# Patient Record
Sex: Male | Born: 1935 | Race: White | Hispanic: No | Marital: Married
Health system: Southern US, Community
[De-identification: ages and names within clinical notes are randomized; demographics above are authoritative.]

## PROBLEM LIST (undated history)

## (undated) DIAGNOSIS — E785 Hyperlipidemia, unspecified: Secondary | ICD-10-CM

## (undated) DIAGNOSIS — I10 Essential (primary) hypertension: Secondary | ICD-10-CM

## (undated) DIAGNOSIS — I509 Heart failure, unspecified: Secondary | ICD-10-CM

## (undated) HISTORY — PX: OTHER SURGICAL HISTORY: SHX169

## (undated) HISTORY — PX: HERNIA REPAIR: SHX51

---

## 2000-05-04 ENCOUNTER — Inpatient Hospital Stay (HOSPITAL_COMMUNITY): Admission: EM | Admit: 2000-05-04 | Discharge: 2000-05-05 | Payer: Self-pay | Admitting: Emergency Medicine

## 2000-05-04 ENCOUNTER — Encounter: Payer: Self-pay | Admitting: Emergency Medicine

## 2014-07-09 DEATH — deceased

## 2019-02-06 DIAGNOSIS — R54 Age-related physical debility: Secondary | ICD-10-CM | POA: Insufficient documentation

## 2019-08-16 ENCOUNTER — Other Ambulatory Visit: Payer: Self-pay

## 2019-08-16 ENCOUNTER — Emergency Department (HOSPITAL_COMMUNITY): Payer: Medicare Other

## 2019-08-16 ENCOUNTER — Encounter (HOSPITAL_COMMUNITY): Payer: Self-pay | Admitting: *Deleted

## 2019-08-16 ENCOUNTER — Inpatient Hospital Stay (HOSPITAL_COMMUNITY)
Admission: EM | Admit: 2019-08-16 | Discharge: 2019-08-21 | DRG: 291 | Disposition: A | Discharge status: Skilled Nursing Facility | Payer: Medicare Other | Attending: Internal Medicine | Admitting: Internal Medicine

## 2019-08-16 ENCOUNTER — Emergency Department (HOSPITAL_COMMUNITY): Admission: EM | Admit: 2019-08-16 | Payer: Self-pay | Source: Home / Self Care

## 2019-08-16 DIAGNOSIS — Z87891 Personal history of nicotine dependence: Secondary | ICD-10-CM

## 2019-08-16 DIAGNOSIS — L97909 Non-pressure chronic ulcer of unspecified part of unspecified lower leg with unspecified severity: Secondary | ICD-10-CM | POA: Diagnosis present

## 2019-08-16 DIAGNOSIS — R0902 Hypoxemia: Secondary | ICD-10-CM

## 2019-08-16 DIAGNOSIS — I5031 Acute diastolic (congestive) heart failure: Secondary | ICD-10-CM | POA: Diagnosis present

## 2019-08-16 DIAGNOSIS — S7002XA Contusion of left hip, initial encounter: Secondary | ICD-10-CM | POA: Diagnosis present

## 2019-08-16 DIAGNOSIS — R0602 Shortness of breath: Secondary | ICD-10-CM

## 2019-08-16 DIAGNOSIS — I350 Nonrheumatic aortic (valve) stenosis: Secondary | ICD-10-CM | POA: Diagnosis not present

## 2019-08-16 DIAGNOSIS — J9601 Acute respiratory failure with hypoxia: Secondary | ICD-10-CM | POA: Diagnosis present

## 2019-08-16 DIAGNOSIS — Z79899 Other long term (current) drug therapy: Secondary | ICD-10-CM

## 2019-08-16 DIAGNOSIS — Z9181 History of falling: Secondary | ICD-10-CM

## 2019-08-16 DIAGNOSIS — T148XXA Other injury of unspecified body region, initial encounter: Secondary | ICD-10-CM

## 2019-08-16 DIAGNOSIS — D72829 Elevated white blood cell count, unspecified: Secondary | ICD-10-CM | POA: Diagnosis present

## 2019-08-16 DIAGNOSIS — I082 Rheumatic disorders of both aortic and tricuspid valves: Secondary | ICD-10-CM | POA: Diagnosis present

## 2019-08-16 DIAGNOSIS — J90 Pleural effusion, not elsewhere classified: Secondary | ICD-10-CM

## 2019-08-16 DIAGNOSIS — W010XXA Fall on same level from slipping, tripping and stumbling without subsequent striking against object, initial encounter: Secondary | ICD-10-CM | POA: Diagnosis present

## 2019-08-16 DIAGNOSIS — I493 Ventricular premature depolarization: Secondary | ICD-10-CM | POA: Diagnosis present

## 2019-08-16 DIAGNOSIS — I83009 Varicose veins of unspecified lower extremity with ulcer of unspecified site: Secondary | ICD-10-CM | POA: Diagnosis present

## 2019-08-16 DIAGNOSIS — I110 Hypertensive heart disease with heart failure: Secondary | ICD-10-CM | POA: Diagnosis not present

## 2019-08-16 DIAGNOSIS — R7989 Other specified abnormal findings of blood chemistry: Secondary | ICD-10-CM

## 2019-08-16 DIAGNOSIS — E785 Hyperlipidemia, unspecified: Secondary | ICD-10-CM | POA: Diagnosis present

## 2019-08-16 DIAGNOSIS — E876 Hypokalemia: Secondary | ICD-10-CM | POA: Diagnosis present

## 2019-08-16 DIAGNOSIS — Z20822 Contact with and (suspected) exposure to covid-19: Secondary | ICD-10-CM | POA: Diagnosis present

## 2019-08-16 DIAGNOSIS — R609 Edema, unspecified: Secondary | ICD-10-CM

## 2019-08-16 DIAGNOSIS — Z7982 Long term (current) use of aspirin: Secondary | ICD-10-CM | POA: Diagnosis not present

## 2019-08-16 DIAGNOSIS — I480 Paroxysmal atrial fibrillation: Secondary | ICD-10-CM | POA: Diagnosis present

## 2019-08-16 DIAGNOSIS — I361 Nonrheumatic tricuspid (valve) insufficiency: Secondary | ICD-10-CM | POA: Diagnosis not present

## 2019-08-16 DIAGNOSIS — I4891 Unspecified atrial fibrillation: Secondary | ICD-10-CM

## 2019-08-16 DIAGNOSIS — Z85828 Personal history of other malignant neoplasm of skin: Secondary | ICD-10-CM | POA: Diagnosis not present

## 2019-08-16 DIAGNOSIS — W19XXXA Unspecified fall, initial encounter: Secondary | ICD-10-CM

## 2019-08-16 HISTORY — DX: Essential (primary) hypertension: I10

## 2019-08-16 HISTORY — DX: Hyperlipidemia, unspecified: E78.5

## 2019-08-16 LAB — CBC WITH DIFFERENTIAL/PLATELET
Abs Immature Granulocytes: 0.07 10*3/uL (ref 0.00–0.07)
Basophils Absolute: 0.1 10*3/uL (ref 0.0–0.1)
Basophils Relative: 0 %
Eosinophils Absolute: 0 10*3/uL (ref 0.0–0.5)
Eosinophils Relative: 0 %
HCT: 44.1 % (ref 39.0–52.0)
Hemoglobin: 14 g/dL (ref 13.0–17.0)
Immature Granulocytes: 1 %
Lymphocytes Relative: 6 %
Lymphs Abs: 0.7 10*3/uL (ref 0.7–4.0)
MCH: 32.8 pg (ref 26.0–34.0)
MCHC: 31.7 g/dL (ref 30.0–36.0)
MCV: 103.3 fL — ABNORMAL HIGH (ref 80.0–100.0)
Monocytes Absolute: 1.1 10*3/uL — ABNORMAL HIGH (ref 0.1–1.0)
Monocytes Relative: 9 %
Neutro Abs: 9.6 10*3/uL — ABNORMAL HIGH (ref 1.7–7.7)
Neutrophils Relative %: 84 %
Platelets: 209 10*3/uL (ref 150–400)
RBC: 4.27 MIL/uL (ref 4.22–5.81)
RDW: 14.8 % (ref 11.5–15.5)
WBC: 11.5 10*3/uL — ABNORMAL HIGH (ref 4.0–10.5)
nRBC: 0 % (ref 0.0–0.2)

## 2019-08-16 LAB — COMPREHENSIVE METABOLIC PANEL
ALT: 24 U/L (ref 0–44)
AST: 30 U/L (ref 15–41)
Albumin: 3.5 g/dL (ref 3.5–5.0)
Alkaline Phosphatase: 78 U/L (ref 38–126)
Anion gap: 9 (ref 5–15)
BUN: 32 mg/dL — ABNORMAL HIGH (ref 8–23)
CO2: 27 mmol/L (ref 22–32)
Calcium: 8.8 mg/dL — ABNORMAL LOW (ref 8.9–10.3)
Chloride: 105 mmol/L (ref 98–111)
Creatinine, Ser: 1.42 mg/dL — ABNORMAL HIGH (ref 0.61–1.24)
GFR calc Af Amer: 50 mL/min — ABNORMAL LOW (ref >60)
GFR calc non Af Amer: 43 mL/min — ABNORMAL LOW (ref >60)
Glucose, Bld: 89 mg/dL (ref 70–99)
Potassium: 3.9 mmol/L (ref 3.5–5.1)
Sodium: 141 mmol/L (ref 135–145)
Total Bilirubin: 1.9 mg/dL — ABNORMAL HIGH (ref 0.3–1.2)
Total Protein: 6.3 g/dL — ABNORMAL LOW (ref 6.5–8.1)

## 2019-08-16 LAB — URINALYSIS, ROUTINE W REFLEX MICROSCOPIC
Bilirubin Urine: NEGATIVE
Glucose, UA: NEGATIVE mg/dL
Hgb urine dipstick: NEGATIVE
Ketones, ur: NEGATIVE mg/dL
Leukocytes,Ua: NEGATIVE
Nitrite: NEGATIVE
Protein, ur: NEGATIVE mg/dL
Specific Gravity, Urine: 1.009 (ref 1.005–1.030)
pH: 6 (ref 5.0–8.0)

## 2019-08-16 LAB — MAGNESIUM: Magnesium: 2 mg/dL (ref 1.7–2.4)

## 2019-08-16 LAB — TROPONIN I (HIGH SENSITIVITY)
Troponin I (High Sensitivity): 53 ng/L — ABNORMAL HIGH (ref <18)
Troponin I (High Sensitivity): 47 ng/L — ABNORMAL HIGH (ref <18)

## 2019-08-16 LAB — BRAIN NATRIURETIC PEPTIDE: B Natriuretic Peptide: 1896.9 pg/mL — ABNORMAL HIGH (ref 0.0–100.0)

## 2019-08-16 LAB — PROTIME-INR
INR: 1.3 — ABNORMAL HIGH (ref 0.8–1.2)
Prothrombin Time: 15.6 seconds — ABNORMAL HIGH (ref 11.4–15.2)

## 2019-08-16 MED ORDER — FUROSEMIDE 10 MG/ML IJ SOLN
40.0000 mg | Freq: Four times a day (QID) | INTRAMUSCULAR | Status: DC
  Administered 2019-08-16 – 2019-08-19 (×9): 40 mg via INTRAVENOUS
  Filled 2019-08-16 (×10): qty 4

## 2019-08-16 MED ORDER — FUROSEMIDE 10 MG/ML IJ SOLN
20.0000 mg | Freq: Once | INTRAMUSCULAR | Status: AC
  Administered 2019-08-16: 20 mg via INTRAVENOUS
  Filled 2019-08-16: qty 4

## 2019-08-16 MED ORDER — SODIUM CHLORIDE 0.9% FLUSH
3.0000 mL | Freq: Two times a day (BID) | INTRAVENOUS | Status: DC
  Administered 2019-08-17 – 2019-08-20 (×9): 3 mL via INTRAVENOUS

## 2019-08-16 NOTE — H&P (Addendum)
History and Physical    Michael Haas LGX:[id number] DOB: 22-Apr-1929 DOA: 08/16/2019  PCP: Christain Sacramento, MD   Chief Complaint: Left hip pain  HPI: Michael Haas is a 86 y.o. male with medical history significant of hypertension, hyperlipidemia, chronic venous stasis ulcers and chronic left wound currently being evaluated in the outpatient setting with wound care, basal cell carcinoma, with ambulatory dysfunction  who presents with left hip pain after mechanical fall while in the bathroom.  Initially patient was unable to bear weight and felt that the pain would improve but over the past 12 hours the pain has not subsided and patient remains unable to ambulate at his usual baseline.  He denies any loss of consciousness syncope presyncope vertiginous symptoms but does indicate this was a mechanical fall, he did not strike his head or injure any other limbs but notes that he slipped and fell onto his left hip.  Review of systems otherwise remarkable for bilateral lower extremity edema, questionably worse than baseline as well as orthopnea, dyspnea with exertion but declines fevers, chills, nausea, vomiting, diarrhea, syncope, presyncope chest pain, sick contacts or recent travel.  ED Course: In the ED patient's lab work revealed a markedly elevated BNP over 1500, plain film of the left hip showed no overt findings, CT abdomen pelvis was also done showing what appears to be a hematoma.  Patient's telemetry and EKG are also concerning for A. fib which may be a new diagnosis for the patient.  While being interviewed in the emergency department patient required oxygen supplementation as his respiratory rate increased and his oxygen levels decreased ultimately requiring upwards of 2 L nasal cannula to maintain sats above 90%.  In the ED patient received Lasix 20 IV x1.  Review of Systems: As per HPI above  Assessment/Plan Principal Problem:   Acute respiratory failure with hypoxia (HCC) Active Problems:  Unspecified atrial fibrillation (HCC)   Venous stasis ulcer (HCC)    Acute hypoxic respiratory failure in the setting of volume overload, POA Rule out new onset heart failure Acute hypoxia with reported orthopnea and dyspnea with exertion with bilateral lower extremity edema consistent with elevated BNP Patient carries no known diagnosis of heart failure however given clinical appearance this is the most likely etiology Follow echocardiogram Continue aggressive diuresis given volume status and hypoxia, Lasix 40 every 6 hours IV, follow creatinine, adjust accordingly Hold ACE inhibitor and HCTZ while diuresing Continue supportive care, nebs, oxygen, early ambulation and proning if necessary to improve patient's respiratory status Patient currently on 2 L nasal cannula in the ED department satting low 90s/high 80s but appears well without accessory muscle use  Irregular heart rate, rule out new onset A. fib versus paroxysmal atrial fibrillation, currently rate controlled Continue to follow along on telemetry, currently rate controlled CHA2DS2-VASc of 4, will need to discuss anticoagulation prior to discharge, however if patient continues to be at high risk for falls with recurrent procedure on left lower extremity given chronic ulcer infection risks of bleeding may outweigh benefit of chronic anticoagulation. Patient declines any irregular heart rate history however May 2020 patient had cardiology follow-up with Dr. Redmond Pulling for "irregular heartbeat" with no definitive diagnosis at that time  Chronic left lower extremity venous stasis ulcer and left lower extremity wound, chronic, POA Currently being followed in the outpatient wound care center -most recent evaluation 07/17/2019 with good report and no new antibiotics or treatment modalities  Acute on chronic ambulatory dysfunction in the setting of above Follow  along with PT OT, patient may benefit from home health versus SNF placement pending  recommendations  AKI versus CKD, POA  Follow morning labs, no creatinine of note available per chart review -we will attempt to obtain records from outpatient Lab Results  Component Value Date   CREATININE 1.42 (H) 08/16/2019   Leukocytosis, mild, likely reactive Follow with morning labs, likely reactive leukocytosis given above Certainly patient does have chronic lower extremity wound but remains afebrile and states the wound looks "better than it has in the past"  Hypertension Home medications include lisinopril, hydrochlorothiazide, metoprolol -we will resume once verified and as indicated  Hyperlipidemia Home medication includes atorvastatin 40, fish oil/omega-3/DHA - resume once verified  Basal cell carcinoma history Appears to be distant diagnosis, no acute issues  Morbid obesity There is no height or weight on file to calculate BMI.   DVT prophylaxis: Early ambulation, holding anticoagulation given hematoma noted in left leg on imaging as above Code Status: Full, lengthy discussion about CPR and intubation at his age, will follow up with PCP and wife for group discussion about advanced directives Family Communication: Wife unavailable by phone Disposition Plan: Inpatient, tentative disposition likely back home pending PT OT evaluation patient may benefit from home health versus SNF placement again pending evaluation.   Consults called: None Admission status: Inpatient, telemetry, continues to require multiple day stay given acute hypoxic respiratory failure in setting of volume overload concern for new onset heart failure as well as new onset A. fib with ambulatory dysfunction.  Patient requires IV diuresis, close monitoring in the setting of aggressive diuresis, further evaluation to diagnose etiology of sudden onset hypervolemia and acute hypoxic respiratory failure.   Past Medical History:  Diagnosis Date  . Hyperlipidemia   . Hypertension     Past Surgical History:    Procedure Laterality Date  . HERNIA REPAIR    . left hand surgery     "It was caught in a machine years ago"     reports that he has never smoked. He quit smokeless tobacco use about 8 weeks ago.  His smokeless tobacco use included chew. He reports previous alcohol use. He reports that he does not use drugs.  No Known Allergies  History reviewed. No pertinent family history.  Prior to Admission medications   Medication Sig Start Date End Date Taking? Authorizing Provider  aspirin 81 MG chewable tablet Chew 81 mg by mouth daily.   Yes [provider]  atorvastatin (LIPITOR) 40 MG tablet Take 40 mg by mouth daily. 02/06/19  Yes [provider]  lisinopril-hydrochlorothiazide (ZESTORETIC) 20-12.5 MG tablet Take 1 tablet by mouth daily. 07/13/19  Yes [provider]  metoprolol tartrate (LOPRESSOR) 100 MG tablet Take 100 mg by mouth 2 (two) times daily. 07/17/19  Yes [provider]    Physical Exam: Vitals:   08/16/19 1037 08/16/19 1230 08/16/19 1300 08/16/19 1523  BP: (!) 128/94 (!) 135/98 (!) 137/96 140/87  Pulse: 99 91 (!) 33 87  Resp: (!) 25 16 18  (!) 35  Temp: 98.2 F (36.8 C)     TempSrc: Oral     SpO2: 100% 92% 91% 94%    Constitutional: NAD, calm, comfortable Vitals:   08/16/19 1037 08/16/19 1230 08/16/19 1300 08/16/19 1523  BP: (!) 128/94 (!) 135/98 (!) 137/96 140/87  Pulse: 99 91 (!) 33 87  Resp: (!) 25 16 18  (!) 35  Temp: 98.2 F (36.8 C)     TempSrc: Oral  SpO2: 100% 92% 91% 94%   General:  Pleasantly resting in bed, No acute distress.  Nontoxic in appearance. HEENT:  Normocephalic atraumatic.  Sclerae nonicteric, noninjected.  Extraocular movements intact bilaterally. Neck:  Without mass or deformity.  Trachea is midline. Lungs: Bibasilar rales without overt rhonchi or wheeze.  Tachypneic into the 20s without respiratory distress, no accessory muscle use Heart: Irregularly irregular.  Without murmurs, rubs, or  gallops. Abdomen:  Soft, nontender, nondistended.  Without guarding or rebound. Extremities: 2+ pitting edema bilateral lower extremities to the knees, left greater than right; left lower extremity bandage and compression sock clean dry intact Vascular: Without obvious JVD, radial pulses bilaterally intact, irregular Psych: Appropriate mood and insight, oriented x4  Labs on Admission: I have personally reviewed following labs and imaging studies  CBC: Recent Labs  Lab 08/16/19 1054  WBC 11.5*  NEUTROABS 9.6*  HGB 14.0  HCT 44.1  MCV 103.3*  PLT 209   Basic Metabolic Panel: Recent Labs  Lab 08/16/19 1054  NA 141  K 3.9  CL 105  CO2 27  GLUCOSE 89  BUN 32*  CREATININE 1.42*  CALCIUM 8.8*  MG 2.0   GFR: CrCl cannot be calculated (Unknown ideal weight.). Liver Function Tests: Recent Labs  Lab 08/16/19 1054  AST 30  ALT 24  ALKPHOS 78  BILITOT 1.9*  PROT 6.3*  ALBUMIN 3.5   No results for input(s): LIPASE, AMYLASE in the last 168 hours. No results for input(s): AMMONIA in the last 168 hours. Coagulation Profile: Recent Labs  Lab 08/16/19 1054  INR 1.3*   Cardiac Enzymes: No results for input(s): CKTOTAL, CKMB, CKMBINDEX, TROPONINI in the last 168 hours. BNP (last 3 results) No results for input(s): PROBNP in the last 8760 hours. HbA1C: No results for input(s): HGBA1C in the last 72 hours. CBG: No results for input(s): GLUCAP in the last 168 hours. Lipid Profile: No results for input(s): CHOL, HDL, LDLCALC, TRIG, CHOLHDL, LDLDIRECT in the last 72 hours. Thyroid Function Tests: No results for input(s): TSH, T4TOTAL, FREET4, T3FREE, THYROIDAB in the last 72 hours. Anemia Panel: No results for input(s): VITAMINB12, FOLATE, FERRITIN, TIBC, IRON, RETICCTPCT in the last 72 hours. Urine analysis: No results found for: COLORURINE, APPEARANCEUR, LABSPEC, PHURINE, GLUCOSEU, HGBUR, BILIRUBINUR, KETONESUR, PROTEINUR, UROBILINOGEN, NITRITE,  LEUKOCYTESUR  Radiological Exams on Admission: DG Chest 2 View  Result Date: 08/16/2019 CLINICAL DATA:  Un witnessed fall. EXAM: CHEST - 2 VIEW COMPARISON:  None. FINDINGS: Moderate right and small left pleural effusions. There is additional lung base opacity, also greater on the right, consistent with atelectasis. Remainder of the lungs is clear. No pneumothorax. Cardiac silhouette is mildly enlarged. No mediastinal or hilar masses. Skeletal structures are grossly intact. IMPRESSION: 1. Moderate right and small left pleural effusions with right greater than left lung base atelectasis. No convincing pneumonia and no evidence of pulmonary edema. 2. Mild cardiomegaly. Electronically Signed   By: Amie Portland M.D.   On: 08/16/2019 11:51   CT PELVIS WO CONTRAST  Result Date: 08/16/2019 CLINICAL DATA:  86 year old with left hip pain after fall. Cannot bear weight. Unwitnessed fall during the night when walking to the bathroom. EXAM: CT PELVIS WITHOUT CONTRAST TECHNIQUE: Multidetector CT imaging of the pelvis was performed following the standard protocol without intravenous contrast. COMPARISON:  Radiograph earlier this day. FINDINGS: Urinary Tract: Distal ureters are decompressed. Mildly distended urinary bladder with wall thickening at the base. Bowel:  Pelvic bowel loops are unremarkable. Vascular/Lymphatic: Atherosclerosis of the distal aorta and  iliac vessels. No bulky abdominopelvic adenopathy. Reproductive:  Enlarged prostate gland spanning 5.4 cm transverse. Other: Generalized body wall and intra-abdominal edema. No significant pelvic free fluid. Musculoskeletal: No evidence of acute fracture on CT. Femoral head is seated in the acetabulum. Pubic rami are intact. Mild degenerative change of the pubic symphysis and sacroiliac joints. Heterogeneous enlargement of the left gluteus maximus muscle. Generalized body wall edema, confluent in the flanks but no focal subcutaneous soft tissue hematoma. IMPRESSION:  1. No evidence of acute fracture on CT. If there is high clinical suspicion for occult hip fracture or the patient refuses to weightbear, consider further evaluation with MRI. 2. Heterogeneous enlargement of the left gluteus maximus muscle suspicious for intramuscular hematoma in the setting of trauma. 3. Generalized body wall edema. 4. Enlarged prostate gland causing mass effect on the bladder base. Aortic Atherosclerosis (ICD10-I70.0). Electronically Signed   By: Narda Rutherford M.D.   On: 08/16/2019 14:11   DG Hip Unilat W or Wo Pelvis 2-3 Views Left  Result Date: 08/16/2019 CLINICAL DATA:  Un witnessed fall.  Left hip pain. EXAM: DG HIP (WITH OR WITHOUT PELVIS) 2-3V LEFT COMPARISON:  None. FINDINGS: No fracture or bone lesion. Hip joint is normally spaced and aligned.  No arthropathic changes. Soft tissues are unremarkable. IMPRESSION: Negative. Electronically Signed   By: Amie Portland M.D.   On: 08/16/2019 11:51    EKG: Independently reviewed.  Irregular no notable P waves, consistent with atrial fibrillation, notable right bundle branch morphology laterally without overt ST elevation or depression, T wave inversion diffusely with occasional PVC  Azucena Fallen DO [facility]  If 7PM-7AM, please contact night-coverage [email or web address]   08/16/2019, 5:47 PM

## 2019-08-16 NOTE — ED Notes (Signed)
Per request from Dr. Rush Landmark, patient now on 2L [place]. Patient also had brief changed by Rayfield Citizen, RN and Lindie Spruce, RN. Patient now clean and dry.

## 2019-08-16 NOTE — ED Notes (Signed)
Provider at bedside

## 2019-08-16 NOTE — ED Notes (Signed)
Pt provided with dinner tray.

## 2019-08-16 NOTE — ED Provider Notes (Signed)
[facility] DEPT Provider Note   CSN: [id number] Arrival date & time: 08/16/19  1019     History Chief Complaint  Patient presents with  . Fall    Michael Haas is a 86 y.o. male.  The history is provided by the patient and medical records. No language interpreter was used.  Hip Pain This is a new problem. The current episode started 12 to 24 hours ago. The problem occurs rarely. The problem has not changed since onset.Pertinent negatives include no chest pain, no abdominal pain, no headaches and no shortness of breath. The symptoms are aggravated by standing and walking. Nothing relieves the symptoms. He has tried nothing for the symptoms. The treatment provided no relief.       No past medical history on file.  There are no problems to display for this patient.   Past Surgical History:  Procedure Laterality Date  . HERNIA REPAIR    . left hand surgery     "It was caught in a machine years ago"       No family history on file.  Social History   Tobacco Use  . Smoking status: Not on file  Substance Use Topics  . Alcohol use: Not on file  . Drug use: Not on file    Home Medications Prior to Admission medications   Not on File    Allergies    Patient has no allergy information on record.  Review of Systems   Review of Systems  Constitutional: Negative for chills, diaphoresis, fatigue and fever.  HENT: Negative for congestion.   Eyes: Negative for visual disturbance.  Respiratory: Negative for cough, chest tightness, shortness of breath and wheezing.   Cardiovascular: Positive for leg swelling. Negative for chest pain and palpitations.  Gastrointestinal: Negative for abdominal pain, constipation, diarrhea, nausea and vomiting.  Genitourinary: Negative for dysuria, flank pain and frequency.  Musculoskeletal: Negative for back pain, neck pain and neck stiffness.  Skin: Negative for rash and wound.  Neurological: Negative for  dizziness, weakness, light-headedness, numbness and headaches.  Psychiatric/Behavioral: Negative for agitation and confusion.  All other systems reviewed and are negative.   Physical Exam Updated Vital Signs BP 140/87 (BP Location: Left Arm)   Pulse 87   Temp 98.2 F (36.8 C) (Oral)   Resp (!) 35   SpO2 94%   Physical Exam Vitals and nursing note reviewed.  Constitutional:      Appearance: He is well-developed. He is not ill-appearing, toxic-appearing or diaphoretic.  HENT:     Head: Normocephalic and atraumatic.     Nose: Nose normal. No congestion or rhinorrhea.     Mouth/Throat:     Mouth: Mucous membranes are moist.     Pharynx: No oropharyngeal exudate or posterior oropharyngeal erythema.  Eyes:     Extraocular Movements: Extraocular movements intact.     Conjunctiva/sclera: Conjunctivae normal.     Pupils: Pupils are equal, round, and reactive to light.  Cardiovascular:     Rate and Rhythm: Normal rate. Rhythm irregular.     Pulses: Normal pulses.     Heart sounds: No murmur.  Pulmonary:     Effort: Pulmonary effort is normal. No respiratory distress.     Breath sounds: Normal breath sounds. No wheezing, rhonchi or rales.  Chest:     Chest wall: No tenderness.  Abdominal:     General: Abdomen is flat.     Palpations: Abdomen is soft.     Tenderness: There is  no abdominal tenderness. There is no right CVA tenderness, left CVA tenderness, guarding or rebound.  Musculoskeletal:        General: Tenderness present.     Cervical back: Neck supple. No tenderness.     Right lower leg: Edema present.     Left lower leg: Edema present.  Skin:    General: Skin is warm and dry.     Capillary Refill: Capillary refill takes less than 2 seconds.  Neurological:     General: No focal deficit present.     Mental Status: He is alert.     Cranial Nerves: No cranial nerve deficit.     Sensory: No sensory deficit.     Motor: No weakness.  Psychiatric:        Mood and Affect:  Mood normal.     ED Results / Procedures / Treatments   Labs (all labs ordered are listed, but only abnormal results are displayed) Labs Reviewed  CBC WITH DIFFERENTIAL/PLATELET - Abnormal; Notable for the following components:      Result Value   WBC 11.5 (*)    MCV 103.3 (*)    Neutro Abs 9.6 (*)    Monocytes Absolute 1.1 (*)    All other components within normal limits  COMPREHENSIVE METABOLIC PANEL - Abnormal; Notable for the following components:   BUN 32 (*)    Creatinine, Ser 1.42 (*)    Calcium 8.8 (*)    Total Protein 6.3 (*)    Total Bilirubin 1.9 (*)    GFR calc non Af Amer 43 (*)    GFR calc Af Amer 50 (*)    All other components within normal limits  BRAIN NATRIURETIC PEPTIDE - Abnormal; Notable for the following components:   B Natriuretic Peptide 1,896.9 (*)    All other components within normal limits  PROTIME-INR - Abnormal; Notable for the following components:   Prothrombin Time 15.6 (*)    INR 1.3 (*)    All other components within normal limits  TROPONIN I (HIGH SENSITIVITY) - Abnormal; Notable for the following components:   Troponin I (High Sensitivity) 53 (*)    All other components within normal limits  TROPONIN I (HIGH SENSITIVITY) - Abnormal; Notable for the following components:   Troponin I (High Sensitivity) 47 (*)    All other components within normal limits  URINE CULTURE  SARS CORONAVIRUS 2 (TAT 6-24 HRS)  MAGNESIUM  URINALYSIS, ROUTINE W REFLEX MICROSCOPIC  CBC  COMPREHENSIVE METABOLIC PANEL    EKG EKG Interpretation  Date/Time:  Thursday August 16 2019 11:04:01 EDT Ventricular Rate:  93 PR Interval:    QRS Duration: 152 QT Interval:  394 QTC Calculation: 491 R Axis:   -117 Text Interpretation: Atrial fibrillation Multiple ventricular premature complexes RBBB and LAFB Borderline ST depression, lateral leads When comapred to prior ECG from 20 years ago, similar Afib. No STEMI Confirmed by Theda Belfast ([id number]) on 08/16/2019  11:15:32 AM   Radiology DG Chest 2 View  Result Date: 08/16/2019 CLINICAL DATA:  Un witnessed fall. EXAM: CHEST - 2 VIEW COMPARISON:  None. FINDINGS: Moderate right and small left pleural effusions. There is additional lung base opacity, also greater on the right, consistent with atelectasis. Remainder of the lungs is clear. No pneumothorax. Cardiac silhouette is mildly enlarged. No mediastinal or hilar masses. Skeletal structures are grossly intact. IMPRESSION: 1. Moderate right and small left pleural effusions with right greater than left lung base atelectasis. No convincing pneumonia and no evidence of pulmonary  edema. 2. Mild cardiomegaly. Electronically Signed   By: Amie Portland M.D.   On: 08/16/2019 11:51   CT PELVIS WO CONTRAST  Result Date: 08/16/2019 CLINICAL DATA:  86 year old with left hip pain after fall. Cannot bear weight. Unwitnessed fall during the night when walking to the bathroom. EXAM: CT PELVIS WITHOUT CONTRAST TECHNIQUE: Multidetector CT imaging of the pelvis was performed following the standard protocol without intravenous contrast. COMPARISON:  Radiograph earlier this day. FINDINGS: Urinary Tract: Distal ureters are decompressed. Mildly distended urinary bladder with wall thickening at the base. Bowel:  Pelvic bowel loops are unremarkable. Vascular/Lymphatic: Atherosclerosis of the distal aorta and iliac vessels. No bulky abdominopelvic adenopathy. Reproductive:  Enlarged prostate gland spanning 5.4 cm transverse. Other: Generalized body wall and intra-abdominal edema. No significant pelvic free fluid. Musculoskeletal: No evidence of acute fracture on CT. Femoral head is seated in the acetabulum. Pubic rami are intact. Mild degenerative change of the pubic symphysis and sacroiliac joints. Heterogeneous enlargement of the left gluteus maximus muscle. Generalized body wall edema, confluent in the flanks but no focal subcutaneous soft tissue hematoma. IMPRESSION: 1. No evidence of acute  fracture on CT. If there is high clinical suspicion for occult hip fracture or the patient refuses to weightbear, consider further evaluation with MRI. 2. Heterogeneous enlargement of the left gluteus maximus muscle suspicious for intramuscular hematoma in the setting of trauma. 3. Generalized body wall edema. 4. Enlarged prostate gland causing mass effect on the bladder base. Aortic Atherosclerosis (ICD10-I70.0). Electronically Signed   By: Narda Rutherford M.D.   On: 08/16/2019 14:11   DG Hip Unilat W or Wo Pelvis 2-3 Views Left  Result Date: 08/16/2019 CLINICAL DATA:  Un witnessed fall.  Left hip pain. EXAM: DG HIP (WITH OR WITHOUT PELVIS) 2-3V LEFT COMPARISON:  None. FINDINGS: No fracture or bone lesion. Hip joint is normally spaced and aligned.  No arthropathic changes. Soft tissues are unremarkable. IMPRESSION: Negative. Electronically Signed   By: Amie Portland M.D.   On: 08/16/2019 11:51    Procedures Procedures (including critical care time)  Medications Ordered in ED Medications  furosemide (LASIX) injection 20 mg (has no administration in time range)    ED Course  I have reviewed the triage vital signs and the nursing notes.  Pertinent labs & imaging results that were available during my care of the patient were reviewed by me and considered in my medical decision making (see chart for details).    MDM Rules/Calculators/A&P                      Michael Haas is a 86 y.o. male with a past medical history found on the chart to reveal hypertension, hypercholesterolemia, venous stasis ulcers and chronic left wound being treated by wound care, prior skin cancer, and recurrent falls who presents with left hip pain after a fall.  Patient reports that he was running to the bathroom last night after dinner when he tripped and fell to the ground.  He reports he has been unable to bear weight or walk since then and had to crawl back to bed after the fall.  He did not hit his head and denies  loss of consciousness.  He is only complaining of the left hip pain.  He reports that he has had peripheral edema that is been ongoing for several months being managed by his PCP at [facility].  He reports that he is seen by the wound team and home health  for the left leg ulcer that he reports is doing extremely well and is currently bandaged and wrapped up.  He denies any new pain in the distal leg.  He says that he does not take any diuretic but his edema has been steady.  He denies any chest pain, palpitations, or fatigue.  He denies any history of A. fib or arrhythmias.  He does not take blood thinners and did not hit his head.  On exam, lungs have faint crackles in the bases and chest is nontender.  Abdomen is nontender.  Back is nontender.  Patient has tenderness in the left hip and has some pain with hip manipulation.  Patient's left lower leg is wrapped up tightly with his bandage wound.  He did have palpable pulse, normal sensation, and strength in the foot.  Right leg is edematous which she reports is unchanged.  Patient has irregular pulse and on telemetry it appears to show atrial fibrillation.  EKG shows A. fib and when compared to the last EKG in our system of 20 years ago, he had A. fib at that time.  [facility] his most recent EKG in the last May had an undetermined rhythm with a bundle branch block.,  Unclear if this is new atrial fibrillation or chronic although they do not report atrial fibrillation in his notes.  We will get x-ray of the pelvis and left hip as well as chest x-ray to the crackles.  We will get screening labs as well as concern for heart failure which may be worsened by atrial fibrillation.  Anticipate reassessment after work-up.  4:51 PM Work-up was begun to return and the x-ray did not show convincing evidence of fracture.  CT was performed showing no fracture but did show a gluteal hematoma in the muscle likely  contributing to the discomfort.  The other work-up with the crackles on my auscultation of the lungs and the edema in the legs revealed likely untreated heart failure with a BNP of over 1800, elevated troponin, chest x-ray showing pleural effusions, and elevated creatinine of 1.42.  When I informed the patient of these findings, he does say that he has not urinated today which is different for him, he does feel his legs are more swollen, and he does report that he gets short of breath when he lays flat which is new.  He does not take any diuretics.  When I was speaking with the patient and he was answering my questions, his oxygen did drop to 84%, we will place him on oxygen and I will call for admission for likely new heart failure as well as for PT/OT to see him for the hematoma on his gluteal area and not letting him walk safely.  Medicine team will be called for admission.    Final Clinical Impression(s) / ED Diagnoses Final diagnoses:  Fall, initial encounter  Hematoma  Edema, unspecified type  Pleural effusion  Shortness of breath  Elevated brain natriuretic peptide (BNP) level    Clinical Impression: 1. Fall, initial encounter   2. Hematoma   3. Edema, unspecified type   4. Pleural effusion   5. Shortness of breath   6. Elevated brain natriuretic peptide (BNP) level     Disposition: Admit  This note was prepared with assistance of Dragon voice recognition software. Occasional wrong-word or sound-a-like substitutions may have occurred due to the inherent limitations of voice recognition software.  Gerilyn Stargell, Canary Brim, MD 08/16/19 [phone]

## 2019-08-16 NOTE — ED Notes (Signed)
Pts wife is concerned about caring for pt at home. She states" I am having a very difficult time caring for him alone at home. I cant pick him up and he is always falling and becoming combative." Pts wife would like to know if there is a skilled nursing facility for rehab or more home health services that pt can get when discharged. Provider notified

## 2019-08-16 NOTE — ED Notes (Signed)
Report called to nurse [facility] for pt transfer to the floor. This nurse also passed on the message from Pts wife about an inquiry for home assistance post discharge or discharge to a skilled nursing facility for rehab because pts wife states " I am having a very difficult time caring for him alone at home. I cant pick him up and he is always falling and becoming combative."

## 2019-08-16 NOTE — ED Triage Notes (Signed)
Patient BIBA from home, had unwitnessed mechanical fall during the night when walking to bathroom. Patient was able to crawl back to room but unable to stand up in bed. Wife was unable to help patient up so called EMS. Complaining of L hip pain, no obvious deformity. Pain increases with weight bearing and resolves when lying down. Pt denies blood thinners.   BP 136/68 P 60 95% ra  rr 16 t 98.6

## 2019-08-17 ENCOUNTER — Inpatient Hospital Stay (HOSPITAL_COMMUNITY): Payer: Medicare Other

## 2019-08-17 DIAGNOSIS — I350 Nonrheumatic aortic (valve) stenosis: Secondary | ICD-10-CM

## 2019-08-17 DIAGNOSIS — I361 Nonrheumatic tricuspid (valve) insufficiency: Secondary | ICD-10-CM

## 2019-08-17 DIAGNOSIS — J9601 Acute respiratory failure with hypoxia: Secondary | ICD-10-CM | POA: Diagnosis not present

## 2019-08-17 LAB — CBC
HCT: 43.5 % (ref 39.0–52.0)
Hemoglobin: 13.7 g/dL (ref 13.0–17.0)
MCH: 32.3 pg (ref 26.0–34.0)
MCHC: 31.5 g/dL (ref 30.0–36.0)
MCV: 102.6 fL — ABNORMAL HIGH (ref 80.0–100.0)
Platelets: 197 10*3/uL (ref 150–400)
RBC: 4.24 MIL/uL (ref 4.22–5.81)
RDW: 14.9 % (ref 11.5–15.5)
WBC: 9.8 10*3/uL (ref 4.0–10.5)
nRBC: 0 % (ref 0.0–0.2)

## 2019-08-17 LAB — COMPREHENSIVE METABOLIC PANEL
ALT: 22 U/L (ref 0–44)
AST: 26 U/L (ref 15–41)
Albumin: 3.3 g/dL — ABNORMAL LOW (ref 3.5–5.0)
Alkaline Phosphatase: 76 U/L (ref 38–126)
Anion gap: 9 (ref 5–15)
BUN: 34 mg/dL — ABNORMAL HIGH (ref 8–23)
CO2: 32 mmol/L (ref 22–32)
Calcium: 8.9 mg/dL (ref 8.9–10.3)
Chloride: 103 mmol/L (ref 98–111)
Creatinine, Ser: 1.41 mg/dL — ABNORMAL HIGH (ref 0.61–1.24)
GFR calc Af Amer: 51 mL/min — ABNORMAL LOW (ref >60)
GFR calc non Af Amer: 44 mL/min — ABNORMAL LOW (ref >60)
Glucose, Bld: 99 mg/dL (ref 70–99)
Potassium: 3.6 mmol/L (ref 3.5–5.1)
Sodium: 144 mmol/L (ref 135–145)
Total Bilirubin: 1.7 mg/dL — ABNORMAL HIGH (ref 0.3–1.2)
Total Protein: 6.3 g/dL — ABNORMAL LOW (ref 6.5–8.1)

## 2019-08-17 LAB — ECHOCARDIOGRAM COMPLETE
Height: 69 in
Weight: 2903.02 oz

## 2019-08-17 LAB — SARS CORONAVIRUS 2 (TAT 6-24 HRS): SARS Coronavirus 2: NEGATIVE

## 2019-08-17 NOTE — Progress Notes (Signed)
  Echocardiogram 2D Echocardiogram has been performed.  Janalyn Harder 08/17/2019, 3:28 PM

## 2019-08-17 NOTE — Progress Notes (Signed)
Progress Note   Michael Haas AYT:[id number] DOB: 1929-05-03 DOA: 08/16/2019  PCP: Barbie Banner, MD   Chief Complaint: Left hip pain  HPI: Michael Haas is a 86 y.o. male with medical history significant of hypertension, hyperlipidemia, chronic venous stasis ulcers and chronic left wound currently being evaluated in the outpatient setting with wound care, basal cell carcinoma, with ambulatory dysfunction  who presents with left hip pain after mechanical fall while in the bathroom.  Initially patient was unable to bear weight and felt that the pain would improve but over the past 12 hours the pain has not subsided and patient remains unable to ambulate at his usual baseline.  He denies any loss of consciousness syncope presyncope vertiginous symptoms but does indicate this was a mechanical fall, he did not strike his head or injure any other limbs but notes that he slipped and fell onto his left hip.  Review of systems otherwise remarkable for bilateral lower extremity edema, questionably worse than baseline as well as orthopnea, dyspnea with exertion but declines fevers, chills, nausea, vomiting, diarrhea, syncope, presyncope chest pain, sick contacts or recent travel. In the ED patient's lab work revealed a markedly elevated BNP over 1500, plain film of the left hip showed no overt findings, CT abdomen pelvis was also done showing what appears to be a hematoma.  Patient's telemetry and EKG are also concerning for A. fib which may be a new diagnosis for the patient.  While being interviewed in the emergency department patient required oxygen supplementation as his respiratory rate increased and his oxygen levels decreased ultimately requiring upwards of 2 L nasal cannula to maintain sats above 90%.  In the ED patient received Lasix 20 IV x1.  Subjective: No acute issues or overnight, she indicates his respiratory status, swelling edema and weakness have markedly improved.  Declines chest pain, nausea,  vomiting, diarrhea, constipation, headache, fevers, chills.   Assessment/Plan Principal Problem:   Acute respiratory failure with hypoxia (HCC) Active Problems:   Unspecified atrial fibrillation (HCC)   Venous stasis ulcer (HCC)    Acute hypoxic respiratory failure in the setting of volume overload, POA Rule out new onset heart failure -Echocardiogram pending -Continue aggressive diuresis Lasix 40 every 6 hours -Creatinine holding stable -Patient nearly 3 L negative since admission, follow I's and O's -Hold ACE inhibitor and HCTZ while diuresing -Continue supportive care, nebs, oxygen, early ambulation and proning if necessary to improve patient's respiratory status SpO2: 97 % O2 Flow Rate (L/min): 2 L/min  Intake/Output Summary (Last 24 hours) at 08/17/2019 0727 Last data filed at 08/17/2019 0556 Gross per 24 hour  Intake 128 ml  Output 2800 ml  Net -2672 ml    Irregular heart rate, rule out new onset A. fib versus paroxysmal atrial fibrillation, currently rate controlled -Continue to follow along on telemetry, currently rate controlled but still irregular -CHA2DS2-VASc of 4, will need to discuss anticoagulation prior to discharge, however if patient continues to be at high risk for falls with recurrent procedure on left lower extremity given chronic ulcer infection risks of bleeding may outweigh benefit of chronic anticoagulation. - Patient declines any irregular heart rate/A. Fib/palpitation history however May 2020 patient had cardiology follow-up with Dr. Andrey Campanile for "irregular heartbeat" with no definitive diagnosis at that time  Chronic left lower extremity venous stasis ulcer and left lower extremity wound, chronic, POA -Currently being followed in the outpatient wound care center -most recent evaluation 07/17/2019 with good report and no new antibiotics or treatment  modalities -Wound care to follow in house  Acute on chronic ambulatory dysfunction in the setting of above -  Follow along with PT OT, patient may benefit from home health versus SNF placement pending recommendations  AKI versus unknown CKD, POA  - Follow morning labs, no creatinine of note available per chart review -we will attempt to obtain records from outpatient Lab Results  Component Value Date   CREATININE 1.41 (H) 08/17/2019   CREATININE 1.42 (H) 08/16/2019   Leukocytosis, mild, likely reactive Follow with morning labs, likely reactive leukocytosis given above Certainly patient does have chronic lower extremity wound but remains afebrile and states the wound looks "better than it has in the past"  Hypertension Home medications include lisinopril, hydrochlorothiazide, metoprolol -we will resume once verified and as indicated  Hyperlipidemia Home medication includes atorvastatin 40, fish oil/omega-3/DHA - resume once verified  Basal cell carcinoma history Appears to be distant diagnosis, no acute issues  Morbid obesity Body mass index is 26.79 kg/m.   DVT prophylaxis: Early ambulation, holding anticoagulation given hematoma noted in left leg on imaging as above Code Status: Full, lengthy discussion about CPR and intubation at his age, will follow up with PCP and wife for discussion about advanced directives Family Communication: Wife updated Disposition Plan: Inpatient, tentative disposition likely back home pending PT OT evaluation patient may benefit from home health versus SNF placement again pending evaluation.   Consults called: None Admission status: Inpatient, telemetry, continues to require multiple day stay given acute hypoxic respiratory failure in setting of volume overload concern for new onset heart failure as well as new onset A. fib with ambulatory dysfunction.  Patient requires IV diuresis, close monitoring in the setting of aggressive diuresis, further evaluation to diagnose etiology of sudden onset hypervolemia and acute hypoxic respiratory failure.  Physical  Exam: Vitals:   08/16/19 2045 08/16/19 2300 08/17/19 0030 08/17/19 0525  BP: 126/84 (!) 132/92 (!) 149/101 115/69  Pulse: (!) 119 96 97 80  Resp: 19 20 20 16   Temp:   97.9 F (36.6 C) 97.8 F (36.6 C)  TempSrc:   Oral Oral  SpO2: 93% 97% 96% 97%  Weight:   82.3 kg   Height:   5\' 9"  (1.753 m)     Constitutional: NAD, calm, comfortable Vitals:   08/16/19 2045 08/16/19 2300 08/17/19 0030 08/17/19 0525  BP: 126/84 (!) 132/92 (!) 149/101 115/69  Pulse: (!) 119 96 97 80  Resp: 19 20 20 16   Temp:   97.9 F (36.6 C) 97.8 F (36.6 C)  TempSrc:   Oral Oral  SpO2: 93% 97% 96% 97%  Weight:   82.3 kg   Height:   5\' 9"  (1.753 m)    General:  Pleasantly resting in bed, No acute distress.  Nontoxic in appearance. HEENT:  Normocephalic atraumatic.  Sclerae nonicteric, noninjected.  Extraocular movements intact bilaterally. Neck:  Without mass or deformity.  Trachea is midline. Lungs: Bibasilar rales without overt rhonchi or wheeze.  Tachypneic into the 20s without respiratory distress, no accessory muscle use Heart: Irregularly irregular.  Without murmurs, rubs, or gallops. Abdomen:  Soft, nontender, nondistended.  Without guarding or rebound. Extremities: 1+ pitting edema bilateral lower extremities to the knees, left greater than right; left lower extremity bandage and compression sock clean dry intact Vascular: Without obvious JVD, radial pulses bilaterally intact, irregular Psych: Appropriate mood and insight, oriented x4  Labs on Admission: I have personally reviewed following labs and imaging studies  CBC: Recent Labs  Lab 08/16/19  1054 08/17/19 0413  WBC 11.5* 9.8  NEUTROABS 9.6*  --   HGB 14.0 13.7  HCT 44.1 43.5  MCV 103.3* 102.6*  PLT 209 197   Basic Metabolic Panel: Recent Labs  Lab 08/16/19 1054 08/17/19 0413  NA 141 144  K 3.9 3.6  CL 105 103  CO2 27 32  GLUCOSE 89 99  BUN 32* 34*  CREATININE 1.42* 1.41*  CALCIUM 8.8* 8.9  MG 2.0  --    GFR: Estimated  Creatinine Clearance: 35.5 mL/min (A) (by C-G formula based on SCr of 1.41 mg/dL (H)). Liver Function Tests: Recent Labs  Lab 08/16/19 1054 08/17/19 0413  AST 30 26  ALT 24 22  ALKPHOS 78 76  BILITOT 1.9* 1.7*  PROT 6.3* 6.3*  ALBUMIN 3.5 3.3*   No results for input(s): LIPASE, AMYLASE in the last 168 hours. No results for input(s): AMMONIA in the last 168 hours. Coagulation Profile: Recent Labs  Lab 08/16/19 1054  INR 1.3*   Cardiac Enzymes: No results for input(s): CKTOTAL, CKMB, CKMBINDEX, TROPONINI in the last 168 hours. BNP (last 3 results) No results for input(s): PROBNP in the last 8760 hours. HbA1C: No results for input(s): HGBA1C in the last 72 hours. CBG: No results for input(s): GLUCAP in the last 168 hours. Lipid Profile: No results for input(s): CHOL, HDL, LDLCALC, TRIG, CHOLHDL, LDLDIRECT in the last 72 hours. Thyroid Function Tests: No results for input(s): TSH, T4TOTAL, FREET4, T3FREE, THYROIDAB in the last 72 hours. Anemia Panel: No results for input(s): VITAMINB12, FOLATE, FERRITIN, TIBC, IRON, RETICCTPCT in the last 72 hours. Urine analysis:    Component Value Date/Time   COLORURINE STRAW (A) 08/16/2019 1954   APPEARANCEUR CLEAR 08/16/2019 1954   LABSPEC 1.009 08/16/2019 1954   PHURINE 6.0 08/16/2019 1954   GLUCOSEU NEGATIVE 08/16/2019 1954   HGBUR NEGATIVE 08/16/2019 1954   BILIRUBINUR NEGATIVE 08/16/2019 1954   KETONESUR NEGATIVE 08/16/2019 1954   PROTEINUR NEGATIVE 08/16/2019 1954   NITRITE NEGATIVE 08/16/2019 1954   LEUKOCYTESUR NEGATIVE 08/16/2019 1954    Radiological Exams on Admission: DG Chest 2 View  Result Date: 08/16/2019 CLINICAL DATA:  Un witnessed fall. EXAM: CHEST - 2 VIEW COMPARISON:  None. FINDINGS: Moderate right and small left pleural effusions. There is additional lung base opacity, also greater on the right, consistent with atelectasis. Remainder of the lungs is clear. No pneumothorax. Cardiac silhouette is mildly enlarged.  No mediastinal or hilar masses. Skeletal structures are grossly intact. IMPRESSION: 1. Moderate right and small left pleural effusions with right greater than left lung base atelectasis. No convincing pneumonia and no evidence of pulmonary edema. 2. Mild cardiomegaly. Electronically Signed   By: Amie Portland M.D.   On: 08/16/2019 11:51   CT PELVIS WO CONTRAST  Result Date: 08/16/2019 CLINICAL DATA:  86 year old with left hip pain after fall. Cannot bear weight. Unwitnessed fall during the night when walking to the bathroom. EXAM: CT PELVIS WITHOUT CONTRAST TECHNIQUE: Multidetector CT imaging of the pelvis was performed following the standard protocol without intravenous contrast. COMPARISON:  Radiograph earlier this day. FINDINGS: Urinary Tract: Distal ureters are decompressed. Mildly distended urinary bladder with wall thickening at the base. Bowel:  Pelvic bowel loops are unremarkable. Vascular/Lymphatic: Atherosclerosis of the distal aorta and iliac vessels. No bulky abdominopelvic adenopathy. Reproductive:  Enlarged prostate gland spanning 5.4 cm transverse. Other: Generalized body wall and intra-abdominal edema. No significant pelvic free fluid. Musculoskeletal: No evidence of acute fracture on CT. Femoral head is seated in the acetabulum. Pubic  rami are intact. Mild degenerative change of the pubic symphysis and sacroiliac joints. Heterogeneous enlargement of the left gluteus maximus muscle. Generalized body wall edema, confluent in the flanks but no focal subcutaneous soft tissue hematoma. IMPRESSION: 1. No evidence of acute fracture on CT. If there is high clinical suspicion for occult hip fracture or the patient refuses to weightbear, consider further evaluation with MRI. 2. Heterogeneous enlargement of the left gluteus maximus muscle suspicious for intramuscular hematoma in the setting of trauma. 3. Generalized body wall edema. 4. Enlarged prostate gland causing mass effect on the bladder base. Aortic  Atherosclerosis (ICD10-I70.0). Electronically Signed   By: Narda Rutherford M.D.   On: 08/16/2019 14:11   DG Hip Unilat W or Wo Pelvis 2-3 Views Left  Result Date: 08/16/2019 CLINICAL DATA:  Un witnessed fall.  Left hip pain. EXAM: DG HIP (WITH OR WITHOUT PELVIS) 2-3V LEFT COMPARISON:  None. FINDINGS: No fracture or bone lesion. Hip joint is normally spaced and aligned.  No arthropathic changes. Soft tissues are unremarkable. IMPRESSION: Negative. Electronically Signed   By: Amie Portland M.D.   On: 08/16/2019 11:51    EKG: Independently reviewed.  Irregular no notable P waves, consistent with atrial fibrillation, notable right bundle branch morphology laterally without overt ST elevation or depression, T wave inversion diffusely with occasional PVC  Azucena Fallen DO [facility]  If 7PM-7AM, please contact night-coverage [email or web address]   08/17/2019, 7:26 AM

## 2019-08-17 NOTE — Progress Notes (Signed)
Patient wife spoke with RN about husband falling "quite often" at home.  She states "she has to have some help"; "I've got to have some relief."   Wife states patient needs regular therapy.  Patient and wife experiencing high degree of stress.

## 2019-08-18 DIAGNOSIS — J9601 Acute respiratory failure with hypoxia: Secondary | ICD-10-CM | POA: Diagnosis not present

## 2019-08-18 LAB — URINE CULTURE: Culture: NO GROWTH

## 2019-08-18 LAB — COMPREHENSIVE METABOLIC PANEL
ALT: 23 U/L (ref 0–44)
AST: 27 U/L (ref 15–41)
Albumin: 3.4 g/dL — ABNORMAL LOW (ref 3.5–5.0)
Alkaline Phosphatase: 79 U/L (ref 38–126)
Anion gap: 12 (ref 5–15)
BUN: 40 mg/dL — ABNORMAL HIGH (ref 8–23)
CO2: 34 mmol/L — ABNORMAL HIGH (ref 22–32)
Calcium: 9 mg/dL (ref 8.9–10.3)
Chloride: 99 mmol/L (ref 98–111)
Creatinine, Ser: 1.67 mg/dL — ABNORMAL HIGH (ref 0.61–1.24)
GFR calc Af Amer: 41 mL/min — ABNORMAL LOW (ref >60)
GFR calc non Af Amer: 36 mL/min — ABNORMAL LOW (ref >60)
Glucose, Bld: 102 mg/dL — ABNORMAL HIGH (ref 70–99)
Potassium: 3.1 mmol/L — ABNORMAL LOW (ref 3.5–5.1)
Sodium: 145 mmol/L (ref 135–145)
Total Bilirubin: 1.6 mg/dL — ABNORMAL HIGH (ref 0.3–1.2)
Total Protein: 6.6 g/dL (ref 6.5–8.1)

## 2019-08-18 LAB — CBC
HCT: 47.3 % (ref 39.0–52.0)
Hemoglobin: 15.2 g/dL (ref 13.0–17.0)
MCH: 33.2 pg (ref 26.0–34.0)
MCHC: 32.1 g/dL (ref 30.0–36.0)
MCV: 103.3 fL — ABNORMAL HIGH (ref 80.0–100.0)
Platelets: 190 10*3/uL (ref 150–400)
RBC: 4.58 MIL/uL (ref 4.22–5.81)
RDW: 14.7 % (ref 11.5–15.5)
WBC: 12.4 10*3/uL — ABNORMAL HIGH (ref 4.0–10.5)
nRBC: 0 % (ref 0.0–0.2)

## 2019-08-18 MED ORDER — LISINOPRIL-HYDROCHLOROTHIAZIDE 20-12.5 MG PO TABS: 1.0000 | ORAL_TABLET | Freq: Every day | ORAL | Status: DC

## 2019-08-18 MED ORDER — METOPROLOL TARTRATE 50 MG PO TABS
100.0000 mg | ORAL_TABLET | Freq: Two times a day (BID) | ORAL | Status: DC
  Administered 2019-08-18 – 2019-08-20 (×6): 100 mg via ORAL
  Filled 2019-08-18 (×8): qty 2

## 2019-08-18 MED ORDER — ATORVASTATIN CALCIUM 40 MG PO TABS
40.0000 mg | ORAL_TABLET | Freq: Every day | ORAL | Status: DC
  Administered 2019-08-18 – 2019-08-21 (×4): 40 mg via ORAL
  Filled 2019-08-18 (×4): qty 1

## 2019-08-18 MED ORDER — POTASSIUM CHLORIDE CRYS ER 20 MEQ PO TBCR
40.0000 meq | EXTENDED_RELEASE_TABLET | Freq: Two times a day (BID) | ORAL | Status: AC
  Administered 2019-08-18 (×2): 40 meq via ORAL
  Filled 2019-08-18 (×2): qty 2

## 2019-08-18 NOTE — Evaluation (Signed)
Occupational Therapy Evaluation Patient Details Name: Michael Haas MRN: [id number] DOB: [birth year] Today's Date: 08/18/2019    History of Present Illness 86 y.o. male with medical history significant of hypertension, hyperlipidemia, chronic venous stasis ulcers and chronic left wound currently being evaluated in the outpatient setting with wound care, basal cell carcinoma, with ambulatory dysfunction  who presents with left hip pain after mechanical fall while in the bathroom. Imaging of L hip negative for fx, hematoma noted. Dx of respiratory failure, volume overload, new onset afib.   Clinical Impression   PTA, pt was living with his wife and reports he performed BADLs (sponge bath at sink), light IADLs, and used rollator for mobility. Pt currently requiring Mod-Max A for LB ADLs and Min Guard-Min A for functional mobility with RW. Pt presenting with decreased balance and activity tolerance impacting his functional performance and safety. Pt SpO2 fluctuating between 97-93% on RA and HR elevating to 160s with activity. Pt would benefit from further acute OT to facilitate safe dc. Recommend dc to SNF for further OT to optimize safety, independence with ADLs, and return to PLOF.      Follow Up Recommendations  SNF;Supervision/Assistance - 24 hour    Equipment Recommendations  Other (comment)(Defer to next venue)    Recommendations for Other Services PT consult     Precautions / Restrictions Precautions Precautions: Fall Precaution Comments: 3-4 falls recently; tripped on shoelace most recently Restrictions Weight Bearing Restrictions: No      Mobility Bed Mobility Overal bed mobility: Needs Assistance Bed Mobility: Supine to Sit     Supine to sit: Min assist     General bed mobility comments: Min A for holding therapist's hand and pulling into upright posture.  Transfers Overall transfer level: Needs assistance Equipment used: Rolling walker (2 wheeled) Transfers: Sit  to/from Stand Sit to Stand: Min guard         General transfer comment: VCs hand placement, min/guard for safety    Balance Overall balance assessment: History of Falls;Needs assistance Sitting-balance support: Feet supported;Single extremity supported Sitting balance-Leahy Scale: Good     Standing balance support: No upper extremity supported;During functional activity;Bilateral upper extremity supported Standing balance-Leahy Scale: Fair Standing balance comment: During peri care, pt able to maintain static standing with close Min guard A for safety                           ADL either performed or assessed with clinical judgement   ADL Overall ADL's : Needs assistance/impaired Eating/Feeding: Set up;Sitting Eating/Feeding Details (indicate cue type and reason): Pt able to manage his breakfast once placed on table while he sat in recliner Grooming: Min guard;Wash/dry face;Standing Grooming Details (indicate cue type and reason): Min Guard A for safety during hadn hygiene Upper Body Bathing: Set up;Supervision/ safety;Sitting   Lower Body Bathing: Moderate assistance;Sit to/from stand   Upper Body Dressing : Set up;Supervision/safety;Sitting   Lower Body Dressing: Maximal assistance;Sit to/from stand Lower Body Dressing Details (indicate cue type and reason): Max A for donning socks. Asking pt how he would don socks at home and he stated he could do it easily at home, but here it is more difficulty.  Toilet Transfer: Minimal assistance;Ambulation;BSC;RW   Toileting- Clothing Manipulation and Hygiene: Minimal assistance Toileting - Clothing Manipulation Details (indicate cue type and reason): Min A for managing gown while pt performed peri care. Min guard A for safety     Functional mobility during ADLs: Min  guard;Minimal assistance;Rolling walker General ADL Comments: Pt presenting with decreased balance and activity tolerance     Vision         Perception      Praxis      Pertinent Vitals/Pain Pain Assessment: No/denies pain     Hand Dominance Right   Extremity/Trunk Assessment Upper Extremity Assessment Upper Extremity Assessment: Overall WFL for tasks assessed   Lower Extremity Assessment Lower Extremity Assessment: Defer to PT evaluation LLE Deficits / Details: L foot edema noted, h/o chronic wound, knee ext +4/5 LLE Sensation: WNL LLE Coordination: WNL   Cervical / Trunk Assessment Cervical / Trunk Assessment: Normal   Communication Communication Communication: No difficulties   Cognition Arousal/Alertness: Awake/alert Behavior During Therapy: WFL for tasks assessed/performed Overall Cognitive Status: Within Functional Limits for tasks assessed                                     General Comments  SpO2 97-93% on RA. HR elevating to 160s with activity    Exercises     Shoulder Instructions      Home Living Family/patient expects to be discharged to:: Private residence Living Arrangements: Spouse/significant other Available Help at Discharge: Family;Available 24 hours/day Type of Home: House Home Access: Stairs to enter [employer] of Steps: 1   Home Layout: One level     Bathroom Shower/Tub: Producer, television/film/video: Handicapped height     Home Equipment: Environmental consultant - 4 wheels;Grab bars - toilet;Grab bars - tub/shower          Prior Functioning/Environment Level of Independence: Independent with assistive device(s)  Gait / Transfers Assistance Needed: walks with rollator ADL's / Homemaking Assistance Needed: stands at sink to bathe   Comments: pt able to get groceries until 1 month ago when he fell; wife verbalized to RN that she's not able to manage pt's care at home        OT Problem List: Decreased strength;Decreased range of motion;Decreased activity tolerance;Impaired balance (sitting and/or standing);Decreased knowledge of use of DME or AE;Decreased knowledge of  precautions      OT Treatment/Interventions: Self-care/ADL training;Therapeutic exercise;Energy conservation;DME and/or AE instruction;Therapeutic activities;Patient/family education    OT Goals(Current goals can be found in the care plan section) Acute Rehab OT Goals Patient Stated Goal: to get stronger OT Goal Formulation: With patient Time For Goal Achievement: 09/01/19 Potential to Achieve Goals: Good  OT Frequency: Min 2X/week   Barriers to D/C:            Co-evaluation PT/OT/SLP Co-Evaluation/Treatment: Yes Reason for Co-Treatment: For patient/therapist safety;To address functional/ADL transfers;Other (comment)(HR elevated) PT goals addressed during session: Mobility/safety with mobility;Proper use of DME;Balance OT goals addressed during session: ADL's and self-care      AM-PAC OT "6 Clicks" Daily Activity     Outcome Measure Help from another person eating meals?: A Little Help from another person taking care of personal grooming?: A Little Help from another person toileting, which includes using toliet, bedpan, or urinal?: A Little Help from another person bathing (including washing, rinsing, drying)?: A Lot Help from another person to put on and taking off regular upper body clothing?: A Little Help from another person to put on and taking off regular lower body clothing?: A Lot 6 Click Score: 16   End of Session Equipment Utilized During Treatment: Rolling walker;Gait belt Nurse Communication: Mobility status  Activity Tolerance: Patient tolerated treatment  well Patient left: in chair;with call bell/phone within reach;with chair alarm set;with nursing/sitter in room  OT Visit Diagnosis: Unsteadiness on feet (R26.81);Other abnormalities of gait and mobility (R26.89);Muscle weakness (generalized) (M62.81)                Time: 5027-7412 OT Time Calculation (min): 26 min Charges:  OT General Charges $OT Visit: 1 Visit OT Evaluation $OT Eval Moderate Complexity: [place], OTR/L Acute Rehab Pager: [phone] Office: [place] 08/18/2019, 10:14 AM

## 2019-08-18 NOTE — Progress Notes (Addendum)
Progress Note   Michael Haas QVZ:[id number] DOB: November 19, 1928 DOA: 08/16/2019  PCP: Barbie Banner, MD   Chief Complaint: Left hip pain  HPI: Michael Haas is a 86 y.o. male with medical history significant of hypertension, hyperlipidemia, chronic venous stasis ulcers and chronic left wound currently being evaluated in the outpatient setting with wound care, basal cell carcinoma, with ambulatory dysfunction  who presents with left hip pain after mechanical fall while in the bathroom.  Initially patient was unable to bear weight and felt that the pain would improve but over the past 12 hours the pain has not subsided and patient remains unable to ambulate at his usual baseline.  He denies any loss of consciousness syncope presyncope vertiginous symptoms but does indicate this was a mechanical fall, he did not strike his head or injure any other limbs but notes that he slipped and fell onto his left hip.  Review of systems otherwise remarkable for bilateral lower extremity edema, questionably worse than baseline as well as orthopnea, dyspnea with exertion but declines fevers, chills, nausea, vomiting, diarrhea, syncope, presyncope chest pain, sick contacts or recent travel. In the ED patient's lab work revealed a markedly elevated BNP over 1500, plain film of the left hip showed no overt findings, CT abdomen pelvis was also done showing what appears to be a hematoma.  Patient's telemetry and EKG are also concerning for A. fib which may be a new diagnosis for the patient.  While being interviewed in the emergency department patient required oxygen supplementation as his respiratory rate increased and his oxygen levels decreased ultimately requiring upwards of 2 L nasal cannula to maintain sats above 90%.  In the ED patient received Lasix 20 IV x1.  Subjective: No acute issues or overnight, she indicates his respiratory status, swelling edema and weakness have markedly improved.  Declines chest pain, nausea,  vomiting, diarrhea, constipation, headache, fevers, chills.   Assessment/Plan Principal Problem:   Acute respiratory failure with hypoxia (HCC) Active Problems:   Unspecified atrial fibrillation (HCC)   Venous stasis ulcer (HCC)    Acute hypoxic respiratory failure in the setting of like heart failure EF 45 to 50%, POA -Echocardiogram as below 45 to 50% EF with global hypokinesis left ventricular concentric hypertrophy and systolic dysfunction -Continue aggressive diuresis Lasix 40 every 6 hours -tolerating quite well -Patient nearly 3 L negative since admission, follow I's and O's -Hold ACE inhibitor and HCTZ while diuresing -Continue supportive care, nebs, oxygen, early ambulation and proning if necessary to improve patient's respiratory status SpO2: 96 % O2 Flow Rate (L/min): 2 L/min  Intake/Output Summary (Last 24 hours) at 08/18/2019 1149 Last data filed at 08/18/2019 1100 Gross per 24 hour  Intake 1140 ml  Output 1725 ml  Net -585 ml    Rule out new onset A. fib versus paroxysmal atrial fibrillation, somewhat poorly controlled -Continue to follow along on telemetry -Continues to elevate into the 140s with exertion, resume patient's home metoprolol, titrate accordingly -CHA2DS2-VASc of 4, will need to discuss anticoagulation prior to discharge, however if patient continues to be at high risk for falls with recurrent procedure on left lower extremity given chronic ulcer infection risks of bleeding may outweigh benefit of chronic anticoagulation. - Patient declines any irregular heart rate/A. Fib/palpitation history however May 2020 patient had cardiology follow-up with Dr. Andrey Campanile for "irregular heartbeat" with no definitive diagnosis at that time -patient currently declines history of A. fib although appears to be a chronic issue  Hypokalemia, moderate in  the setting of diuresis  -Continue to replete daily, follow with morning labs Lab Results  Component Value Date   NA 145  08/18/2019   K 3.1 (L) 08/18/2019   CL 99 08/18/2019   CO2 34 (H) 08/18/2019    Chronic left lower extremity venous stasis ulcer and left lower extremity wound, chronic, POA -Currently being followed in the outpatient wound care center -most recent evaluation 07/17/2019 with good report and no new antibiotics or treatment modalities -Wound care to follow in house  Acute on chronic ambulatory dysfunction in the setting of above - Follow along with PT OT, patient may benefit from home health versus SNF placement pending recommendations  AKI versus unknown CKD, POA, stable - Follow morning labs, no creatinine of note available per chart review -we will attempt to obtain records from outpatient Lab Results  Component Value Date   CREATININE 1.67 (H) 08/18/2019   CREATININE 1.41 (H) 08/17/2019   CREATININE 1.42 (H) 08/16/2019    Leukocytosis, mild, likely reactive Follow with morning labs, likely reactive leukocytosis given above Certainly patient does have chronic lower extremity wound but remains afebrile and states the wound looks "better than it has in the past"  Hypertension Home medications include lisinopril, hydrochlorothiazide -these will be held given questionable AKI as above Metoprolol resumed as above  Hyperlipidemia Home atorvastatin  Basal cell carcinoma history Appears to be distant diagnosis, no acute issues/need for further work-up  Morbid obesity Body mass index is 24.47 kg/m.   DVT prophylaxis: Early ambulation, holding anticoagulation given hematoma noted in left leg on imaging as above Code Status: Full, lengthy discussion about CPR and intubation at his age, will follow up with PCP and wife for discussion about advanced directives Family Communication: Wife updated Disposition Plan: Inpatient, tentative disposition likely back home pending PT OT evaluation patient may benefit from home health versus SNF placement again pending evaluation.   Consults  called: None Admission status: Inpatient, telemetry, continues to require multiple day stay given acute hypoxic respiratory failure in setting of volume overload concern for new onset heart failure as well as new onset A. fib with ambulatory dysfunction.  Patient requires IV diuresis, close monitoring in the setting of aggressive diuresis, further evaluation to diagnose etiology of sudden onset hypervolemia and acute hypoxic respiratory failure.  Physical Exam: Vitals:   08/18/19 0458 08/18/19 0808 08/18/19 0817 08/18/19 0925  BP: 128/83     Pulse: (!) 108 (!) 160 (!) 140 85  Resp: 20     Temp: (!) 97.5 F (36.4 C)     TempSrc: Oral     SpO2: 96%     Weight: 75.2 kg     Height:        Constitutional: NAD, calm, comfortable Vitals:   08/18/19 0458 08/18/19 0808 08/18/19 0817 08/18/19 0925  BP: 128/83     Pulse: (!) 108 (!) 160 (!) 140 85  Resp: 20     Temp: (!) 97.5 F (36.4 C)     TempSrc: Oral     SpO2: 96%     Weight: 75.2 kg     Height:       General:  Pleasantly resting in bed, No acute distress.  Nontoxic in appearance. HEENT:  Normocephalic atraumatic.  Sclerae nonicteric, noninjected.  Extraocular movements intact bilaterally. Neck:  Without mass or deformity.  Trachea is midline. Lungs: Bibasilar rales without overt rhonchi or wheeze.  Tachypneic into the 20s without respiratory distress, no accessory muscle use Heart: Irregularly irregular.  Without murmurs, rubs, or gallops. Abdomen:  Soft, nontender, nondistended.  Without guarding or rebound. Extremities: 1+ pitting edema bilateral lower extremities to the knees, left greater than right; left lower extremity bandage and compression sock clean dry intact Vascular: Without obvious JVD, radial pulses bilaterally intact, irregular Psych: Appropriate mood and insight, oriented x4  Labs on Admission: I have personally reviewed following labs and imaging studies  CBC: Recent Labs  Lab 08/16/19 1054 08/17/19 0413  08/18/19 0353  WBC 11.5* 9.8 12.4*  NEUTROABS 9.6*  --   --   HGB 14.0 13.7 15.2  HCT 44.1 43.5 47.3  MCV 103.3* 102.6* 103.3*  PLT 209 197 329   Basic Metabolic Panel: Recent Labs  Lab 08/16/19 1054 08/17/19 0413 08/18/19 0353  NA 141 144 145  K 3.9 3.6 3.1*  CL 105 103 99  CO2 27 32 34*  GLUCOSE 89 99 102*  BUN 32* 34* 40*  CREATININE 1.42* 1.41* 1.67*  CALCIUM 8.8* 8.9 9.0  MG 2.0  --   --    GFR: Estimated Creatinine Clearance: 30 mL/min (A) (by C-G formula based on SCr of 1.67 mg/dL (H)). Liver Function Tests: Recent Labs  Lab 08/16/19 1054 08/17/19 0413 08/18/19 0353  AST 30 26 27   ALT 24 22 23   ALKPHOS 78 76 79  BILITOT 1.9* 1.7* 1.6*  PROT 6.3* 6.3* 6.6  ALBUMIN 3.5 3.3* 3.4*   No results for input(s): LIPASE, AMYLASE in the last 168 hours. No results for input(s): AMMONIA in the last 168 hours. Coagulation Profile: Recent Labs  Lab 08/16/19 1054  INR 1.3*   Cardiac Enzymes: No results for input(s): CKTOTAL, CKMB, CKMBINDEX, TROPONINI in the last 168 hours. BNP (last 3 results) No results for input(s): PROBNP in the last 8760 hours. HbA1C: No results for input(s): HGBA1C in the last 72 hours. CBG: No results for input(s): GLUCAP in the last 168 hours. Lipid Profile: No results for input(s): CHOL, HDL, LDLCALC, TRIG, CHOLHDL, LDLDIRECT in the last 72 hours. Thyroid Function Tests: No results for input(s): TSH, T4TOTAL, FREET4, T3FREE, THYROIDAB in the last 72 hours. Anemia Panel: No results for input(s): VITAMINB12, FOLATE, FERRITIN, TIBC, IRON, RETICCTPCT in the last 72 hours. Urine analysis:    Component Value Date/Time   COLORURINE STRAW (A) 08/16/2019 1954   APPEARANCEUR CLEAR 08/16/2019 1954   LABSPEC 1.009 08/16/2019 1954   PHURINE 6.0 08/16/2019 1954   GLUCOSEU NEGATIVE 08/16/2019 1954   HGBUR NEGATIVE 08/16/2019 [place] NEGATIVE 08/16/2019 [place] NEGATIVE 08/16/2019 1954   PROTEINUR NEGATIVE 08/16/2019 1954     NITRITE NEGATIVE 08/16/2019 1954   LEUKOCYTESUR NEGATIVE 08/16/2019 1954    Radiological Exams on Admission: CT PELVIS WO CONTRAST  Result Date: 08/16/2019 CLINICAL DATA:  86 year old with left hip pain after fall. Cannot bear weight. Unwitnessed fall during the night when walking to the bathroom. EXAM: CT PELVIS WITHOUT CONTRAST TECHNIQUE: Multidetector CT imaging of the pelvis was performed following the standard protocol without intravenous contrast. COMPARISON:  Radiograph earlier this day. FINDINGS: Urinary Tract: Distal ureters are decompressed. Mildly distended urinary bladder with wall thickening at the base. Bowel:  Pelvic bowel loops are unremarkable. Vascular/Lymphatic: Atherosclerosis of the distal aorta and iliac vessels. No bulky abdominopelvic adenopathy. Reproductive:  Enlarged prostate gland spanning 5.4 cm transverse. Other: Generalized body wall and intra-abdominal edema. No significant pelvic free fluid. Musculoskeletal: No evidence of acute fracture on CT. Femoral head is seated in the acetabulum. Pubic rami are intact. Mild degenerative  change of the pubic symphysis and sacroiliac joints. Heterogeneous enlargement of the left gluteus maximus muscle. Generalized body wall edema, confluent in the flanks but no focal subcutaneous soft tissue hematoma. IMPRESSION: 1. No evidence of acute fracture on CT. If there is high clinical suspicion for occult hip fracture or the patient refuses to weightbear, consider further evaluation with MRI. 2. Heterogeneous enlargement of the left gluteus maximus muscle suspicious for intramuscular hematoma in the setting of trauma. 3. Generalized body wall edema. 4. Enlarged prostate gland causing mass effect on the bladder base. Aortic Atherosclerosis (ICD10-I70.0). Electronically Signed   By: Narda Rutherford M.D.   On: 08/16/2019 14:11   ECHOCARDIOGRAM COMPLETE  Result Date: 08/17/2019    ECHOCARDIOGRAM REPORT   Patient Name:   Michael Haas Date of  Exam: 08/17/2019 Medical Rec #:  [id number]     Height:       69.0 in Accession #:    [id number]    Weight:       181.4 lb Date of Birth:  11-Mar-1929     BSA:          1.982 m Patient Age:    89 years      BP:           124/84 mmHg Patient Gender: M             HR:           118 bpm. Exam Location:  Inpatient Procedure: 2D Echo, Cardiac Doppler and Color Doppler Indications:    Acute respiratory failure; R06.02 SOB  History:        Patient has no prior history of Echocardiogram examinations.                 Abnormal ECG, Arrythmias:Atrial Fibrillation,                 Signs/Symptoms:Dyspnea and Shortness of Breath; Risk                 Factors:Hypertension and Dyslipidemia. Hypoxia.  Sonographer:    Sheralyn Boatman RDCS Referring Phys: [id number] Azucena Fallen  Sonographer Comments: Technically difficult study due to poor echo windows. Image acquisition challenging due to patient body habitus. IMPRESSIONS  1. Left ventricular ejection fraction, by estimation, is 45 to 50%. The left ventricle has mildly decreased function. The left ventricle demonstrates global hypokinesis. There is mild concentric left ventricular hypertrophy. Left ventricular diastolic function could not be evaluated.  2. Right ventricular systolic function is normal. The right ventricular size is normal. There is mildly elevated pulmonary artery systolic pressure.  3. Left atrial size was mildly dilated.  4. Right atrial size was mildly dilated.  5. The mitral valve is normal in structure. No evidence of mitral valve regurgitation.  6. Tricuspid valve regurgitation is mild to moderate.  7. The aortic valve is tricuspid. Aortic valve regurgitation is not visualized. Mild aortic valve stenosis.  8. The inferior vena cava is normal in size with <50% respiratory variability, suggesting right atrial pressure of 8 mmHg. FINDINGS  Left Ventricle: Left ventricular ejection fraction, by estimation, is 45 to 50%. The left ventricle has mildly decreased function.  The left ventricle demonstrates global hypokinesis. The left ventricular internal cavity size was normal in size. There is  mild concentric left ventricular hypertrophy. Left ventricular diastolic function could not be evaluated due to atrial fibrillation. Left ventricular diastolic function could not be evaluated. Right Ventricle: The right ventricular size is normal. No increase in  right ventricular wall thickness. Right ventricular systolic function is normal. There is mildly elevated pulmonary artery systolic pressure. The tricuspid regurgitant velocity is 2.77  m/s, and with an assumed right atrial pressure of 8 mmHg, the estimated right ventricular systolic pressure is 38.7 mmHg. Left Atrium: Left atrial size was mildly dilated. Right Atrium: Right atrial size was mildly dilated. Pericardium: There is no evidence of pericardial effusion. Mitral Valve: The mitral valve is normal in structure. There is mild thickening of the mitral valve leaflet(s). No evidence of mitral valve regurgitation. Tricuspid Valve: The tricuspid valve is normal in structure. Tricuspid valve regurgitation is mild to moderate. Aortic Valve: The aortic valve is tricuspid. . There is moderate thickening and moderate calcification of the aortic valve. Aortic valve regurgitation is not visualized. Mild aortic stenosis is present. There is moderate thickening of the aortic valve. There is moderate calcification of the aortic valve. Aortic valve mean gradient measures 8.0 mmHg. Aortic valve peak gradient measures 14.1 mmHg. Aortic valve area, by VTI measures 1.69 cm. Pulmonic Valve: The pulmonic valve was grossly normal. Pulmonic valve regurgitation is mild. Aorta: The aortic root is normal in size and structure. Venous: The inferior vena cava is normal in size with less than 50% respiratory variability, suggesting right atrial pressure of 8 mmHg. IAS/Shunts: The interatrial septum appears to be lipomatous. No atrial level shunt detected by  color flow Doppler.  LEFT VENTRICLE PLAX 2D LVIDd:         4.33 cm LVIDs:         2.78 cm LV PW:         1.32 cm LV IVS:        1.16 cm LVOT diam:     1.90 cm LV SV:         49 LV SV Index:   25 LVOT Area:     2.84 cm  LV Volumes (MOD) LV vol d, MOD A2C: 41.6 ml LV vol d, MOD A4C: 50.6 ml LV vol s, MOD A2C: 25.5 ml LV vol s, MOD A4C: 24.0 ml LV SV MOD A2C:     16.1 ml LV SV MOD A4C:     50.6 ml LV SV MOD BP:      21.4 ml RIGHT VENTRICLE         IVC TAPSE (M-mode): 1.2 cm  IVC diam: 1.59 cm LEFT ATRIUM             Index       RIGHT ATRIUM           Index LA diam:        4.30 cm 2.17 cm/m  RA Area:     21.50 cm LA Vol (A2C):   64.4 ml 32.49 ml/m RA Volume:   67.30 ml  33.95 ml/m LA Vol (A4C):   88.0 ml 44.39 ml/m LA Biplane Vol: 76.6 ml 38.64 ml/m  AORTIC VALVE                    PULMONIC VALVE AV Area (Vmax):    1.93 cm     PR End Diast Vel: 1.92 msec AV Area (Vmean):   1.83 cm AV Area (VTI):     1.69 cm AV Vmax:           188.00 cm/s AV Vmean:          130.500 cm/s AV VTI:            0.291 m AV Peak Grad:  14.1 mmHg AV Mean Grad:      8.0 mmHg LVOT Vmax:         128.00 cm/s LVOT Vmean:        84.000 cm/s LVOT VTI:          0.173 m LVOT/AV VTI ratio: 0.59  AORTA Ao Root diam: 3.40 cm MITRAL VALVE               TRICUSPID VALVE MV Area (PHT): 5.97 cm    TR Peak grad:   30.7 mmHg MV Decel Time: 127 msec    TR Vmax:        277.00 cm/s MV E velocity: 75.20 cm/s                            SHUNTS                            Systemic VTI:  0.17 m                            Systemic Diam: 1.90 cm Rachelle Hora Croitoru MD Electronically signed by Thurmon Fair MD Signature Date/Time: 08/17/2019/5:01:24 PM    Final     EKG: Independently reviewed.  Irregular no notable P waves, consistent with atrial fibrillation, notable right bundle branch morphology laterally without overt ST elevation or depression, T wave inversion diffusely with occasional PVC  Azucena Fallen DO [facility]  If 7PM-7AM, please  contact night-coverage [email or web address]   08/18/2019, 11:49 AM

## 2019-08-18 NOTE — Evaluation (Addendum)
Physical Therapy Evaluation Patient Details Name: Michael Haas MRN: [id number] DOB: 1929/04/13 Today's Date: 08/18/2019   History of Present Illness  86 y.o. male with medical history significant of hypertension, hyperlipidemia, chronic venous stasis ulcers and chronic left wound currently being evaluated in the outpatient setting with wound care, basal cell carcinoma, with ambulatory dysfunction  who presents with left hip pain after mechanical fall while in the bathroom. Imaging of L hip negative for fx, hematoma noted. Dx of respiratory failure, volume overload, new onset afib.  Clinical Impression  Pt admitted with above diagnosis. Min A for bed mobility, min/guard transfers, pt ambulated 5' with RW, distance limited by therapist 2* HR 160 with activity. SaO2 93-97% on RA during PT session. SNF recommended, pt's wife is not able to manage his care at home. He has had several recent falls.  Pt currently with functional limitations due to the deficits listed below (see PT Problem List). Pt will benefit from skilled PT to increase their independence and safety with mobility to allow discharge to the venue listed below.       Follow Up Recommendations SNF;Supervision for mobility/OOB    Equipment Recommendations  None recommended by PT    Recommendations for Other Services       Precautions / Restrictions Precautions Precautions: Fall Precaution Comments: 3-4 falls recently; tripped on shoelace most recently Restrictions Weight Bearing Restrictions: No      Mobility  Bed Mobility Overal bed mobility: Needs Assistance Bed Mobility: Supine to Sit     Supine to sit: Min assist     General bed mobility comments: min A to raise trunk, used rail  Transfers Overall transfer level: Needs assistance Equipment used: Rolling walker (2 wheeled) Transfers: Sit to/from Stand Sit to Stand: Min guard         General transfer comment: VCs hand placement, min/guard for  safety  Ambulation/Gait Ambulation/Gait assistance: Min guard Gait Distance (Feet): 5 Feet Assistive device: Rolling walker (2 wheeled) Gait Pattern/deviations: Step-through pattern;Decreased stride length Gait velocity: decr   General Gait Details: distance limited by PT 2*  HR of 160 with activity; pt stood at sink to wash hands, SaO2 93-97% on RA during session  Stairs            Wheelchair Mobility    Modified Rankin (Stroke Patients Only)       Balance Overall balance assessment: History of Falls;Needs assistance Sitting-balance support: Feet supported;Single extremity supported Sitting balance-Leahy Scale: Good     Standing balance support: Bilateral upper extremity supported Standing balance-Leahy Scale: Poor Standing balance comment: relies on BUE support                             Pertinent Vitals/Pain Pain Assessment: No/denies pain    Home Living Family/patient expects to be discharged to:: Private residence Living Arrangements: Spouse/significant other Available Help at Discharge: Family;Available 24 hours/day Type of Home: House Home Access: Stairs to enter   [employer] of Steps: 1 Home Layout: One level Home Equipment: Walker - 4 wheels;Grab bars - toilet;Grab bars - tub/shower      Prior Function Level of Independence: Independent with assistive device(s)   Gait / Transfers Assistance Needed: walks with rollator  ADL's / Homemaking Assistance Needed: stands at sink to bathe  Comments: pt able to get groceries until 1 month ago when he fell; wife verbalized to RN that she's not able to manage pt's care at home  Hand Dominance        Extremity/Trunk Assessment   Upper Extremity Assessment Upper Extremity Assessment: Defer to OT evaluation    Lower Extremity Assessment Lower Extremity Assessment: LLE deficits/detail LLE Deficits / Details: L foot edema noted, h/o chronic wound, knee ext +4/5 LLE  Sensation: WNL LLE Coordination: WNL    Cervical / Trunk Assessment Cervical / Trunk Assessment: Normal  Communication   Communication: No difficulties  Cognition Arousal/Alertness: Awake/alert Behavior During Therapy: WFL for tasks assessed/performed Overall Cognitive Status: Within Functional Limits for tasks assessed                                        General Comments      Exercises     Assessment/Plan    PT Assessment Patient needs continued PT services  PT Problem List Decreased balance;Decreased activity tolerance;Decreased mobility;Cardiopulmonary status limiting activity;Decreased knowledge of use of DME       PT Treatment Interventions Gait training;Functional mobility training;Therapeutic activities;Therapeutic exercise;Balance training;Patient/family education    PT Goals (Current goals can be found in the Care Plan section)  Acute Rehab PT Goals Patient Stated Goal: to get stronger PT Goal Formulation: With patient Time For Goal Achievement: 09/01/19 Potential to Achieve Goals: Good    Frequency Min 2X/week   Barriers to discharge Decreased caregiver support wife stated to RN that she's not able to manage pt's care at home    Co-evaluation PT/OT/SLP Co-Evaluation/Treatment: Yes Reason for Co-Treatment: For patient/therapist safety PT goals addressed during session: Mobility/safety with mobility;Proper use of DME;Balance         AM-PAC PT "6 Clicks" Mobility  Outcome Measure Help needed turning from your back to your side while in a flat bed without using bedrails?: A Little Help needed moving from lying on your back to sitting on the side of a flat bed without using bedrails?: A Little Help needed moving to and from a bed to a chair (including a wheelchair)?: A Little Help needed standing up from a chair using your arms (e.g., wheelchair or bedside chair)?: A Little Help needed to walk in hospital room?: A Little Help needed  climbing 3-5 steps with a railing? : A Lot 6 Click Score: 17    End of Session Equipment Utilized During Treatment: Gait belt Activity Tolerance: Treatment limited secondary to medical complications (Comment)(HR 160) Patient left: in chair;with call bell/phone within reach;with chair alarm set;with nursing/sitter in room Nurse Communication: Mobility status PT Visit Diagnosis: Unsteadiness on feet (R26.81);History of falling (Z91.81);Difficulty in walking, not elsewhere classified (R26.2)    Time: 0962-8366 PT Time Calculation (min) (ACUTE ONLY): 18 min   Charges:   PT Evaluation $PT Eval Low Complexity: 1 Low          Blondell Reveal Kistler PT 08/18/2019  Acute Rehabilitation Services Pager [phone] Office [phone]

## 2019-08-18 NOTE — Progress Notes (Signed)
Patient ambulated in rm and up to chair with PT. Tachy on telemetry with HR >140 sustained with multiple PVC's. PO Metoprolol 100 mg given @ 0815 as ordered. Attending MD paged.

## 2019-08-19 DIAGNOSIS — J9601 Acute respiratory failure with hypoxia: Secondary | ICD-10-CM | POA: Diagnosis not present

## 2019-08-19 LAB — CBC
HCT: 50.9 % (ref 39.0–52.0)
Hemoglobin: 17.1 g/dL — ABNORMAL HIGH (ref 13.0–17.0)
MCH: 33.5 pg (ref 26.0–34.0)
MCHC: 33.6 g/dL (ref 30.0–36.0)
MCV: 99.6 fL (ref 80.0–100.0)
Platelets: 230 10*3/uL (ref 150–400)
RBC: 5.11 MIL/uL (ref 4.22–5.81)
RDW: 14.6 % (ref 11.5–15.5)
WBC: 11.9 10*3/uL — ABNORMAL HIGH (ref 4.0–10.5)
nRBC: 0 % (ref 0.0–0.2)

## 2019-08-19 LAB — COMPREHENSIVE METABOLIC PANEL
ALT: 26 U/L (ref 0–44)
AST: 30 U/L (ref 15–41)
Albumin: 3.5 g/dL (ref 3.5–5.0)
Alkaline Phosphatase: 83 U/L (ref 38–126)
Anion gap: 13 (ref 5–15)
BUN: 44 mg/dL — ABNORMAL HIGH (ref 8–23)
CO2: 34 mmol/L — ABNORMAL HIGH (ref 22–32)
Calcium: 9.4 mg/dL (ref 8.9–10.3)
Chloride: 96 mmol/L — ABNORMAL LOW (ref 98–111)
Creatinine, Ser: 1.56 mg/dL — ABNORMAL HIGH (ref 0.61–1.24)
GFR calc Af Amer: 45 mL/min — ABNORMAL LOW (ref >60)
GFR calc non Af Amer: 39 mL/min — ABNORMAL LOW (ref >60)
Glucose, Bld: 90 mg/dL (ref 70–99)
Potassium: 4 mmol/L (ref 3.5–5.1)
Sodium: 143 mmol/L (ref 135–145)
Total Bilirubin: 2.1 mg/dL — ABNORMAL HIGH (ref 0.3–1.2)
Total Protein: 6.9 g/dL (ref 6.5–8.1)

## 2019-08-19 MED ORDER — FUROSEMIDE 10 MG/ML IJ SOLN
40.0000 mg | Freq: Two times a day (BID) | INTRAMUSCULAR | Status: DC
  Administered 2019-08-19 – 2019-08-20 (×2): 40 mg via INTRAVENOUS
  Filled 2019-08-19 (×2): qty 4

## 2019-08-19 NOTE — Progress Notes (Signed)
Progress Note   SAURAV CRUMBLE ZOX:[id number] DOB: 1928-07-16 DOA: 08/16/2019  PCP: Barbie Banner, MD   Chief Complaint: Left hip pain  HPI: Michael Haas is a 86 y.o. male with medical history significant of hypertension, hyperlipidemia, chronic venous stasis ulcers and chronic left wound currently being evaluated in the outpatient setting with wound care, basal cell carcinoma, with ambulatory dysfunction  who presents with left hip pain after mechanical fall while in the bathroom.  Initially patient was unable to bear weight and felt that the pain would improve but over the past 12 hours the pain has not subsided and patient remains unable to ambulate at his usual baseline.  He denies any loss of consciousness syncope presyncope vertiginous symptoms but does indicate this was a mechanical fall, he did not strike his head or injure any other limbs but notes that he slipped and fell onto his left hip.  Review of systems otherwise remarkable for bilateral lower extremity edema, questionably worse than baseline as well as orthopnea, dyspnea with exertion but declines fevers, chills, nausea, vomiting, diarrhea, syncope, presyncope chest pain, sick contacts or recent travel. In the ED patient's lab work revealed a markedly elevated BNP over 1500, plain film of the left hip showed no overt findings, CT abdomen pelvis was also done showing what appears to be a hematoma.  Patient's telemetry and EKG are also concerning for A. fib which may be a new diagnosis for the patient.  While being interviewed in the emergency department patient required oxygen supplementation as his respiratory rate increased and his oxygen levels decreased ultimately requiring upwards of 2 L nasal cannula to maintain sats above 90%.  In the ED patient received Lasix 20 IV x1.  Subjective: No acute issues or events overnight, continues to complain of generalized weakness and difficulty ambulating with physical therapy yesterday "we did not  get very far" but appears to be tolerating p.o. quite well, diuresing well otherwise indicates he feels stronger than previous but not yet back to baseline.  He declines chest pain, shortness of breath, nausea, vomiting, diarrhea, constipation, headache, fevers, chills.   Assessment/Plan Principal Problem:   Acute respiratory failure with hypoxia (HCC) Active Problems:   Unspecified atrial fibrillation (HCC)   Venous stasis ulcer (HCC)   Acute hypoxic respiratory failure in the setting of like heart failure EF 45 to 50%, POA, resolving -Echocardiogram as below 45 to 50% EF with global hypokinesis left ventricular concentric hypertrophy and systolic dysfunction -Continue aggressive diuresis Lasix 40 every 6 hours -tolerating quite well -creatinine downtrending despite aggressive diuresis indicating appropriate response -Hold ACE inhibitor and HCTZ while diuresing -Continue supportive care, nebs, oxygen, early ambulation and proning if necessary to improve patient's respiratory status -Now on room air - continue ambulatory oxygen screening -very limited ambulation over the past 48 hours due to weakness  Intake/Output Summary (Last 24 hours) at 08/19/2019 0726 Last data filed at 08/19/2019 0513 Gross per 24 hour  Intake 723 ml  Output 3400 ml  Net -2677 ml    Rule out new onset A. fib versus chronic paroxysmal atrial fibrillation, rate controlled -Continue to follow along on telemetry -Heart rate very well controlled at rest, still somewhat elevated with exertion into the low 100s but marked improvement compared to previously(into the 140s and 150s) -CHA2DS2-VASc of 4 -patient was to follow-up with PCP for further evaluation and discussion about anticoagulation given his high fall risk, multiple wounds requiring evaluation with the wound care center and advanced risk of  bleeding - Patient declines any irregular heart rate/A. Fib/palpitation history however May 2020 patient had cardiology  follow-up with Dr. Andrey Campanile for "irregular heartbeat" with no definitive diagnosis at that time -patient currently declines history of A. fib although appears to be a somewhat chronic issue  Hypokalemia, moderate in the setting of diuresis are resolving -Continue to replete daily, follow with morning labs Lab Results  Component Value Date   NA 143 08/19/2019   K 4.0 08/19/2019   CL 96 (L) 08/19/2019   CO2 34 (H) 08/19/2019    Chronic left lower extremity venous stasis ulcer and left lower extremity wound, chronic, POA -Currently being followed in the outpatient wound care center -most recent evaluation 07/17/2019 with good report and no new antibiotics or treatment modalities -Wound care to follow in house  Acute on chronic ambulatory dysfunction in the setting of above - Follow along with PT OT -currently recommending SNF, patient agreeable at this time given profound weakness and difficulty with ambulation, at home his wife is his only caretaker and unable to assist him with weightbearing or ambulation.  AKI versus unknown CKD, POA, stable - Follow morning labs, no creatinine of note available per chart review - we will attempt to obtain records from outpatient Lab Results  Component Value Date   CREATININE 1.56 (H) 08/19/2019   CREATININE 1.67 (H) 08/18/2019   CREATININE 1.41 (H) 08/17/2019    Leukocytosis, mild, likely reactive, improving Follow with morning labs, likely reactive leukocytosis given above Certainly patient does have chronic lower extremity wound but remains afebrile and states the wound looks "better than it has in the past"  Hypertension Home medications include lisinopril, hydrochlorothiazide -these will be held given questionable AKI as above Metoprolol resumed as above  Hyperlipidemia Home atorvastatin  Basal cell carcinoma history Appears to be distant diagnosis, no acute issues/need for further work-up  Morbid obesity Body mass index is 23.44  kg/m.   DVT prophylaxis: Early ambulation, holding anticoagulation given hematoma noted in left leg on imaging as above Code Status: Full, lengthy discussion about CPR and intubation at admission -we will follow up with wife and PCP goals of care discussion Family Communication: Wife previously updated Disposition Plan: Inpatient, currently recommending discharge to SNF given profound weakness, need for close monitoring and ongoing physical therapy.  Case management aware, will continue to work with physical therapy for further recommendations and insight.  Patient likely stable for discharge to facility in the next 24 to 48 hours pending volume status and improvement in hypoxia/dyspnea with exertion. Consults called: None Admission status: Inpatient, telemetry, continues to require multiple day stay given acute hypoxic respiratory failure in setting of volume overload given new diagnosis of heart failure as well as exacerbation of what appears to be paroxysmal A. fib with acutely worsening ambulatory dysfunction.  Patient requires IV diuresis, close monitoring in the setting of aggressive diuresis, further evaluation to diagnose etiology of sudden onset hypervolemia and acute hypoxic respiratory failure and physical therapy for ambulatory dysfunction.  Physical Exam: Vitals:   08/18/19 1236 08/18/19 2015 08/18/19 2100 08/19/19 0447  BP: 121/77  97/61 109/68  Pulse: 81 92 84 84  Resp: (!) 22  20 16   Temp: (!) 97.4 F (36.3 C)  (!) 97.5 F (36.4 C) 98.1 F (36.7 C)  TempSrc: Oral  Oral Oral  SpO2: 95%  93% 94%  Weight:    72 kg  Height:        Constitutional: NAD, calm, comfortable Vitals:   08/18/19 1236  08/18/19 2015 08/18/19 2100 08/19/19 0447  BP: 121/77  97/61 109/68  Pulse: 81 92 84 84  Resp: (!) 22  20 16   Temp: (!) 97.4 F (36.3 C)  (!) 97.5 F (36.4 C) 98.1 F (36.7 C)  TempSrc: Oral  Oral Oral  SpO2: 95%  93% 94%  Weight:    72 kg  Height:       General:  Pleasantly  resting in bed, No acute distress.  Nontoxic in appearance. HEENT:  Normocephalic atraumatic.  Sclerae nonicteric, noninjected.  Extraocular movements intact bilaterally. Neck:  Without mass or deformity.  Trachea is midline. Lungs: Bibasilar rales without overt rhonchi or wheeze.  Tachypneic into the 20s without respiratory distress, no accessory muscle use Heart: Irregularly irregular.  Without murmurs, rubs, or gallops. Abdomen:  Soft, nontender, nondistended.  Without guarding or rebound. Extremities: 1+ pitting edema bilateral lower extremities to the mid-calf Vascular: Without obvious JVD, radial pulses bilaterally intact, irregular Psych: Appropriate mood and insight, oriented x4  Labs on Admission: I have personally reviewed following labs and imaging studies  CBC: Recent Labs  Lab 08/16/19 1054 08/17/19 0413 08/18/19 0353 08/19/19 0421  WBC 11.5* 9.8 12.4* 11.9*  NEUTROABS 9.6*  --   --   --   HGB 14.0 13.7 15.2 17.1*  HCT 44.1 43.5 47.3 50.9  MCV 103.3* 102.6* 103.3* 99.6  PLT 209 197 190 230   Basic Metabolic Panel: Recent Labs  Lab 08/16/19 1054 08/17/19 0413 08/18/19 0353 08/19/19 0421  NA 141 144 145 143  K 3.9 3.6 3.1* 4.0  CL 105 103 99 96*  CO2 27 32 34* 34*  GLUCOSE 89 99 102* 90  BUN 32* 34* 40* 44*  CREATININE 1.42* 1.41* 1.67* 1.56*  CALCIUM 8.8* 8.9 9.0 9.4  MG 2.0  --   --   --    GFR: Estimated Creatinine Clearance: 32.1 mL/min (A) (by C-G formula based on SCr of 1.56 mg/dL (H)). Liver Function Tests: Recent Labs  Lab 08/16/19 1054 08/17/19 0413 08/18/19 0353 08/19/19 0421  AST 30 26 27 30   ALT 24 22 23 26   ALKPHOS 78 76 79 83  BILITOT 1.9* 1.7* 1.6* 2.1*  PROT 6.3* 6.3* 6.6 6.9  ALBUMIN 3.5 3.3* 3.4* 3.5   No results for input(s): LIPASE, AMYLASE in the last 168 hours. No results for input(s): AMMONIA in the last 168 hours. Coagulation Profile: Recent Labs  Lab 08/16/19 1054  INR 1.3*   Cardiac Enzymes: No results for  input(s): CKTOTAL, CKMB, CKMBINDEX, TROPONINI in the last 168 hours. BNP (last 3 results) No results for input(s): PROBNP in the last 8760 hours. HbA1C: No results for input(s): HGBA1C in the last 72 hours. CBG: No results for input(s): GLUCAP in the last 168 hours. Lipid Profile: No results for input(s): CHOL, HDL, LDLCALC, TRIG, CHOLHDL, LDLDIRECT in the last 72 hours. Thyroid Function Tests: No results for input(s): TSH, T4TOTAL, FREET4, T3FREE, THYROIDAB in the last 72 hours. Anemia Panel: No results for input(s): VITAMINB12, FOLATE, FERRITIN, TIBC, IRON, RETICCTPCT in the last 72 hours. Urine analysis:    Component Value Date/Time   COLORURINE STRAW (A) 08/16/2019 1954   APPEARANCEUR CLEAR 08/16/2019 1954   LABSPEC 1.009 08/16/2019 1954   PHURINE 6.0 08/16/2019 1954   GLUCOSEU NEGATIVE 08/16/2019 1954   HGBUR NEGATIVE 08/16/2019 1954   BILIRUBINUR NEGATIVE 08/16/2019 1954   KETONESUR NEGATIVE 08/16/2019 1954   PROTEINUR NEGATIVE 08/16/2019 1954   NITRITE NEGATIVE 08/16/2019 1954   LEUKOCYTESUR NEGATIVE 08/16/2019  1954    Radiological Exams on Admission: ECHOCARDIOGRAM COMPLETE  Result Date: 08/17/2019    ECHOCARDIOGRAM REPORT   Patient Name:   Michael Haas Date of Exam: 08/17/2019 Medical Rec #:  [id number]     Height:       69.0 in Accession #:    [id number]    Weight:       181.4 lb Date of Birth:  1929/02/01     BSA:          1.982 m Patient Age:    89 years      BP:           124/84 mmHg Patient Gender: M             HR:           118 bpm. Exam Location:  Inpatient Procedure: 2D Echo, Cardiac Doppler and Color Doppler Indications:    Acute respiratory failure; R06.02 SOB  History:        Patient has no prior history of Echocardiogram examinations.                 Abnormal ECG, Arrythmias:Atrial Fibrillation,                 Signs/Symptoms:Dyspnea and Shortness of Breath; Risk                 Factors:Hypertension and Dyslipidemia. Hypoxia.  Sonographer:    Sheralyn Boatman RDCS  Referring Phys: [id number] Azucena Fallen  Sonographer Comments: Technically difficult study due to poor echo windows. Image acquisition challenging due to patient body habitus. IMPRESSIONS  1. Left ventricular ejection fraction, by estimation, is 45 to 50%. The left ventricle has mildly decreased function. The left ventricle demonstrates global hypokinesis. There is mild concentric left ventricular hypertrophy. Left ventricular diastolic function could not be evaluated.  2. Right ventricular systolic function is normal. The right ventricular size is normal. There is mildly elevated pulmonary artery systolic pressure.  3. Left atrial size was mildly dilated.  4. Right atrial size was mildly dilated.  5. The mitral valve is normal in structure. No evidence of mitral valve regurgitation.  6. Tricuspid valve regurgitation is mild to moderate.  7. The aortic valve is tricuspid. Aortic valve regurgitation is not visualized. Mild aortic valve stenosis.  8. The inferior vena cava is normal in size with <50% respiratory variability, suggesting right atrial pressure of 8 mmHg. FINDINGS  Left Ventricle: Left ventricular ejection fraction, by estimation, is 45 to 50%. The left ventricle has mildly decreased function. The left ventricle demonstrates global hypokinesis. The left ventricular internal cavity size was normal in size. There is  mild concentric left ventricular hypertrophy. Left ventricular diastolic function could not be evaluated due to atrial fibrillation. Left ventricular diastolic function could not be evaluated. Right Ventricle: The right ventricular size is normal. No increase in right ventricular wall thickness. Right ventricular systolic function is normal. There is mildly elevated pulmonary artery systolic pressure. The tricuspid regurgitant velocity is 2.77  m/s, and with an assumed right atrial pressure of 8 mmHg, the estimated right ventricular systolic pressure is 38.7 mmHg. Left Atrium: Left atrial  size was mildly dilated. Right Atrium: Right atrial size was mildly dilated. Pericardium: There is no evidence of pericardial effusion. Mitral Valve: The mitral valve is normal in structure. There is mild thickening of the mitral valve leaflet(s). No evidence of mitral valve regurgitation. Tricuspid Valve: The tricuspid valve is normal in structure. Tricuspid valve regurgitation is  mild to moderate. Aortic Valve: The aortic valve is tricuspid. . There is moderate thickening and moderate calcification of the aortic valve. Aortic valve regurgitation is not visualized. Mild aortic stenosis is present. There is moderate thickening of the aortic valve. There is moderate calcification of the aortic valve. Aortic valve mean gradient measures 8.0 mmHg. Aortic valve peak gradient measures 14.1 mmHg. Aortic valve area, by VTI measures 1.69 cm. Pulmonic Valve: The pulmonic valve was grossly normal. Pulmonic valve regurgitation is mild. Aorta: The aortic root is normal in size and structure. Venous: The inferior vena cava is normal in size with less than 50% respiratory variability, suggesting right atrial pressure of 8 mmHg. IAS/Shunts: The interatrial septum appears to be lipomatous. No atrial level shunt detected by color flow Doppler.  LEFT VENTRICLE PLAX 2D LVIDd:         4.33 cm LVIDs:         2.78 cm LV PW:         1.32 cm LV IVS:        1.16 cm LVOT diam:     1.90 cm LV SV:         49 LV SV Index:   25 LVOT Area:     2.84 cm  LV Volumes (MOD) LV vol d, MOD A2C: 41.6 ml LV vol d, MOD A4C: 50.6 ml LV vol s, MOD A2C: 25.5 ml LV vol s, MOD A4C: 24.0 ml LV SV MOD A2C:     16.1 ml LV SV MOD A4C:     50.6 ml LV SV MOD BP:      21.4 ml RIGHT VENTRICLE         IVC TAPSE (M-mode): 1.2 cm  IVC diam: 1.59 cm LEFT ATRIUM             Index       RIGHT ATRIUM           Index LA diam:        4.30 cm 2.17 cm/m  RA Area:     21.50 cm LA Vol (A2C):   64.4 ml 32.49 ml/m RA Volume:   67.30 ml  33.95 ml/m LA Vol (A4C):   88.0 ml 44.39  ml/m LA Biplane Vol: 76.6 ml 38.64 ml/m  AORTIC VALVE                    PULMONIC VALVE AV Area (Vmax):    1.93 cm     PR End Diast Vel: 1.92 msec AV Area (Vmean):   1.83 cm AV Area (VTI):     1.69 cm AV Vmax:           188.00 cm/s AV Vmean:          130.500 cm/s AV VTI:            0.291 m AV Peak Grad:      14.1 mmHg AV Mean Grad:      8.0 mmHg LVOT Vmax:         128.00 cm/s LVOT Vmean:        84.000 cm/s LVOT VTI:          0.173 m LVOT/AV VTI ratio: 0.59  AORTA Ao Root diam: 3.40 cm MITRAL VALVE               TRICUSPID VALVE MV Area (PHT): 5.97 cm    TR Peak grad:   30.7 mmHg MV Decel Time: 127 msec    TR Vmax:  277.00 cm/s MV E velocity: 75.20 cm/s                            SHUNTS                            Systemic VTI:  0.17 m                            Systemic Diam: 1.90 cm Rachelle HoraMihai Croitoru MD Electronically signed by Thurmon FairMihai Croitoru MD Signature Date/Time: 08/17/2019/5:01:24 PM    Final     EKG: Independently reviewed.  Irregular no notable P waves, consistent with atrial fibrillation, notable right bundle branch morphology laterally without overt ST elevation or depression, T wave inversion diffusely with occasional PVC  Azucena FallenWilliam C Malan Werk DO [facility]  If 7PM-7AM, please contact night-coverage [email or web address]   08/19/2019, 7:26 AM

## 2019-08-19 NOTE — TOC Initial Note (Signed)
Transition of Care [facility]) - Initial/Assessment Note    Patient Details  Name: Michael Haas MRN: [id number] Date of Birth: Oct 04, 1928  Transition of Care [facility]) CM/SW Contact:    Michael Haas Phone Number: [phone] 08/19/2019, 3:59 PM  Clinical Narrative:                 Patient presents at [facility] for mechanical fall.  This CSw spoke with the patient's wife Michael Haas [phone], for collateral information.  This CSW informed Michael Haas the [name] was recommended for the patient.  Michael Haas stated their preferred SNF is [facility] in [place].  Michael Haas is also open to SNFs placement in [place] [facility] and [address] [place] and [place] are too far).  Her goal is to have the patient placed close to her so she can visit him regularly.  This CSW did caution Michael Haas that placement was dependent on bed availability.  Michael Haas stated her understanding.  The patient is oriented 4X, is able to dress, bathe with minimal assistance and ambulate to ambulate on his own with his walker.  The patient has difficulty reaching the bathroom on time and therefore has instances of incontinency, he wears adult pull ups at home.  Until recently the patient has been able to drive himself to doctors appointments and to get medication.  Patient was told to stop driving by PCP due to ulcers at the bottom of his feet.   This CSW has completed the assessment, patient does not have a PASRR#.  This CSW was unable to apply for PASRR due to location, since this CSW is based out of [facility] and not [facility].  Fl2 has not been completed due to inability to get PASRR.  Expected Discharge Plan: Skilled Nursing Facility     Patient Goals and CMS Choice   CMS Medicare.gov Compare Post Acute Care list provided to:: Patient Represenative (must comment)([name] [phone]) Choice offered to / list presented to : Spouse  Expected Discharge Plan and Services Expected Discharge  Plan: Skilled Nursing Facility In-house Referral: Clinical Social Work   Post Acute Care Choice: Skilled Nursing Facility Living arrangements for the past 2 months: Single Family Home                                      Prior Living Arrangements/Services Living arrangements for the past 2 months: Single Family Home Lives with:: Spouse Patient language and need for interpreter reviewed:: Yes Do you feel safe going back to the place where you live?: Yes      Need for Family Participation in Patient Care: Yes (Comment) Care giver support system in place?: Yes (comment)   Criminal Activity/Legal Involvement Pertinent to Current Situation/Hospitalization: No - Comment as needed  Activities of Daily Living Home Assistive Devices/Equipment: Cane (specify quad or straight), Walker (specify type) ADL Screening (condition at time of admission) Patient's cognitive ability adequate to safely complete daily activities?: Yes Is the patient deaf or have difficulty hearing?: Yes Does the patient have difficulty seeing, even when wearing glasses/contacts?: No Does the patient have difficulty concentrating, remembering, or making decisions?: No Patient able to express need for assistance with ADLs?: Yes Does the patient have difficulty dressing or bathing?: No Independently performs ADLs?: No Communication: Independent Dressing (OT): Independent Grooming: Independent Feeding: Independent Bathing: Independent Toileting: Independent In/Out Bed: Independent Walks in Home:  Dependent Is this a change from baseline?: Pre-admission baseline Does the patient have difficulty walking or climbing stairs?: Yes Weakness of Legs: Left Weakness of Arms/Hands: None  Permission Sought/Granted Permission sought to share information with : Family Supports Permission granted to share information with : Yes, Verbal Permission Granted  Share Information with NAME: Michael Haas     Permission granted  to share info w Relationship: Spouse  Permission granted to share info w Contact Information: [phone]  Emotional Assessment Appearance:: Appears stated age     Orientation: : Oriented to Self, Oriented to Place, Oriented to  Time, Oriented to Situation Alcohol / Substance Use: Not Applicable Psych Involvement: No (comment)  Admission diagnosis:  Shortness of breath [R06.02] Pleural effusion [J90] Hematoma [T14.8XXA] Hypoxia [R09.02] Elevated brain natriuretic peptide (BNP) level [R79.89] Acute respiratory failure with hypoxia ([place]) [J96.01] Fall, initial encounter [W19.XXXA] Edema, unspecified type [R60.9] Patient Active Problem List   Diagnosis Date Noted  . Acute respiratory failure with hypoxia ([place]) 08/16/2019  . Unspecified atrial fibrillation ([place]) 08/16/2019  . Venous stasis ulcer ([place]) 08/16/2019   PCP:  Christain Sacramento, MD Pharmacy:   [facility] [place] - [address] [place] [address] [place] [address] Phone: [phone] Fax: [phone]     Social Determinants of Health (SDOH) Interventions    Readmission Risk Interventions No flowsheet data found.

## 2019-08-19 NOTE — Progress Notes (Signed)
Physical Therapy Treatment Patient Details Name: Michael Haas MRN: [id number] DOB: [birth year] Today's Date: 08/19/2019    History of Present Illness 86 y.o. male with medical history significant of hypertension, hyperlipidemia, chronic venous stasis ulcers and chronic left wound currently being evaluated in the outpatient setting with wound care, basal cell carcinoma, with ambulatory dysfunction  who presents with left hip pain after mechanical fall while in the bathroom. Imaging of L hip negative for fx, hematoma noted. Dx of respiratory failure, volume overload, new onset afib.    PT Comments    Pt requested by MD to see pt again and assess HR with gait.  Pt's HR was in 90s today, but pt is unsteady with gait with decreased L LE step, almost dragging it at times, which increases his fall risk.  Continue to recommend SNF at this time. Family asking about [facility] as they live close to it.     Follow Up Recommendations  SNF;Supervision for mobility/OOB     Equipment Recommendations  None recommended by PT    Recommendations for Other Services       Precautions / Restrictions Precautions Precautions: Fall Precaution Comments: 3-4 falls recently; tripped on shoelace most recently Restrictions Weight Bearing Restrictions: No    Mobility  Bed Mobility   Bed Mobility: Supine to Sit     Supine to sit: Min assist     General bed mobility comments: bed flattened to simulate bed at home and he needed MIN A to get trunk upright  Transfers Overall transfer level: Needs assistance Equipment used: Rolling walker (2 wheeled) Transfers: Sit to/from Stand Sit to Stand: Min guard         General transfer comment: min/guard for safety  Ambulation/Gait Ambulation/Gait assistance: Min assist Gait Distance (Feet): 25 Feet(x2) Assistive device: Rolling walker (2 wheeled) Gait Pattern/deviations: Step-to pattern;Decreased step length - left Gait velocity: decr   General Gait  Details: HR in 90's with gait today. Pt with decreased progression of L LE with gait causing him to be increased fall risk, especially with turns.  Cues to stay closer to RW.   Stairs             Wheelchair Mobility    Modified Rankin (Stroke Patients Only)       Balance Overall balance assessment: History of Falls;Needs assistance Sitting-balance support: Feet supported Sitting balance-Leahy Scale: Good     Standing balance support: Bilateral upper extremity supported Standing balance-Leahy Scale: Poor Standing balance comment: poor dynamic                            Cognition Arousal/Alertness: Awake/alert Behavior During Therapy: WFL for tasks assessed/performed Overall Cognitive Status: History of cognitive impairments - at baseline                                        Exercises      General Comments        Pertinent Vitals/Pain Pain Assessment: No/denies pain    Home Living                      Prior Function            PT Goals (current goals can now be found in the care plan section) Acute Rehab PT Goals Patient Stated Goal: to get stronger PT Goal Formulation:  With patient/family Time For Goal Achievement: 09/01/19 Potential to Achieve Goals: Good Progress towards PT goals: Progressing toward goals    Frequency    Min 2X/week      PT Plan Current plan remains appropriate    Co-evaluation              AM-PAC PT "6 Clicks" Mobility   Outcome Measure  Help needed turning from your back to your side while in a flat bed without using bedrails?: A Little Help needed moving from lying on your back to sitting on the side of a flat bed without using bedrails?: A Little Help needed moving to and from a bed to a chair (including a wheelchair)?: A Little Help needed standing up from a chair using your arms (e.g., wheelchair or bedside chair)?: A Little Help needed to walk in hospital room?: A  Little Help needed climbing 3-5 steps with a railing? : A Lot 6 Click Score: 17    End of Session Equipment Utilized During Treatment: Gait belt Activity Tolerance: Patient tolerated treatment well Patient left: in bed;with call bell/phone within reach;with bed alarm set;with family/visitor present Nurse Communication: Mobility status PT Visit Diagnosis: Unsteadiness on feet (R26.81);History of falling (Z91.81);Difficulty in walking, not elsewhere classified (R26.2)     Time: 3500-9381 PT Time Calculation (min) (ACUTE ONLY): 22 min  Charges:  $Gait Training: 8-22 mins                     Comer Devins L. Katrinka Blazing, [place] Pager [phone] 08/19/2019    Enzo Montgomery 08/19/2019, 1:28 PM

## 2019-08-20 DIAGNOSIS — J9601 Acute respiratory failure with hypoxia: Secondary | ICD-10-CM | POA: Diagnosis not present

## 2019-08-20 LAB — COMPREHENSIVE METABOLIC PANEL
ALT: 24 U/L (ref 0–44)
AST: 26 U/L (ref 15–41)
Albumin: 3.3 g/dL — ABNORMAL LOW (ref 3.5–5.0)
Alkaline Phosphatase: 75 U/L (ref 38–126)
Anion gap: 14 (ref 5–15)
BUN: 54 mg/dL — ABNORMAL HIGH (ref 8–23)
CO2: 32 mmol/L (ref 22–32)
Calcium: 8.8 mg/dL — ABNORMAL LOW (ref 8.9–10.3)
Chloride: 95 mmol/L — ABNORMAL LOW (ref 98–111)
Creatinine, Ser: 1.59 mg/dL — ABNORMAL HIGH (ref 0.61–1.24)
GFR calc Af Amer: 44 mL/min — ABNORMAL LOW (ref >60)
GFR calc non Af Amer: 38 mL/min — ABNORMAL LOW (ref >60)
Glucose, Bld: 91 mg/dL (ref 70–99)
Potassium: 3.4 mmol/L — ABNORMAL LOW (ref 3.5–5.1)
Sodium: 141 mmol/L (ref 135–145)
Total Bilirubin: 1.9 mg/dL — ABNORMAL HIGH (ref 0.3–1.2)
Total Protein: 6.4 g/dL — ABNORMAL LOW (ref 6.5–8.1)

## 2019-08-20 LAB — CBC
HCT: 48 % (ref 39.0–52.0)
Hemoglobin: 15.5 g/dL (ref 13.0–17.0)
MCH: 32.5 pg (ref 26.0–34.0)
MCHC: 32.3 g/dL (ref 30.0–36.0)
MCV: 100.6 fL — ABNORMAL HIGH (ref 80.0–100.0)
Platelets: 228 10*3/uL (ref 150–400)
RBC: 4.77 MIL/uL (ref 4.22–5.81)
RDW: 14.6 % (ref 11.5–15.5)
WBC: 13.6 10*3/uL — ABNORMAL HIGH (ref 4.0–10.5)
nRBC: 0 % (ref 0.0–0.2)

## 2019-08-20 MED ORDER — FUROSEMIDE 20 MG PO TABS
20.0000 mg | ORAL_TABLET | Freq: Two times a day (BID) | ORAL | Status: DC
  Administered 2019-08-20 – 2019-08-21 (×3): 20 mg via ORAL
  Filled 2019-08-20 (×3): qty 1

## 2019-08-20 NOTE — Progress Notes (Signed)
Progress Note   Michael Haas:[id number] DOB: 13-Dec-1928 DOA: 08/16/2019  PCP: Barbie Banner, MD   Chief Complaint: Left hip pain  HPI: Michael Haas is a 86 y.o. male with medical history significant of hypertension, hyperlipidemia, chronic venous stasis ulcers and chronic left wound currently being evaluated in the outpatient setting with wound care, basal cell carcinoma, with ambulatory dysfunction  who presents with left hip pain after mechanical fall while in the bathroom.  Initially patient was unable to bear weight and felt that the pain would improve but over the past 12 hours the pain has not subsided and patient remains unable to ambulate at his usual baseline.  He denies any loss of consciousness syncope presyncope vertiginous symptoms but does indicate this was a mechanical fall, he did not strike his head or injure any other limbs but notes that he slipped and fell onto his left hip.  Review of systems otherwise remarkable for bilateral lower extremity edema, questionably worse than baseline as well as orthopnea, dyspnea with exertion but declines fevers, chills, nausea, vomiting, diarrhea, syncope, presyncope chest pain, sick contacts or recent travel. In the ED patient's lab work revealed a markedly elevated BNP over 1500, plain film of the left hip showed no overt findings, CT abdomen pelvis was also done showing what appears to be a hematoma.  Patient's telemetry and EKG are also concerning for A. fib which may be a new diagnosis for the patient.  While being interviewed in the emergency department patient required oxygen supplementation as his respiratory rate increased and his oxygen levels decreased ultimately requiring upwards of 2 L nasal cannula to maintain sats above 90%.  In the ED patient received Lasix 20 IV x1.  Subjective: No acute issues or events overnight, patient indicates his weakness and ambulatory dysfunction are ongoing but markedly improved over the past 24 hours.   He indicates he still needs more assistance than he is used to for ambulation.  Otherwise declines headache, fever, chills, nausea, vomiting.  Assessment/Plan Principal Problem:   Acute respiratory failure with hypoxia (HCC) Active Problems:   Unspecified atrial fibrillation (HCC)   Venous stasis ulcer (HCC)    Acute hypoxic respiratory failure in the setting of like heart failure EF 45 to 50%, POA, resolving -Echocardiogram as below 45 to 50% EF with global hypokinesis left ventricular concentric hypertrophy and systolic dysfunction -Now more euvolemic, transition to p.o. Lasix twice daily -Hold ACE inhibitor and HCTZ while diuresing -blood pressure has been moderately well controlled these medications, may be able to discontinue at discharge -Continue supportive care, nebs, oxygen, early ambulation and proning if necessary to improve patient's respiratory status -Now on room air - continue ambulatory oxygen screening -very limited ambulation over the past 48 hours due to weakness but improving per the patient  Intake/Output Summary (Last 24 hours) at 08/20/2019 0725 Last data filed at 08/20/2019 0700 Gross per 24 hour  Intake 233 ml  Output 2000 ml  Net -1767 ml    Rule out new onset A. fib versus chronic paroxysmal atrial fibrillation, rate controlled -Continue to follow along on telemetry -Heart rate very well controlled at rest, still somewhat elevated with exertion into the low 100s but marked improvement compared to previously(into the 140s and 150s) -CHA2DS2-VASc of 4 -patient was to follow-up with PCP for further evaluation and discussion about anticoagulation given his high fall risk, multiple wounds requiring evaluation with the wound care center and advanced risk of bleeding - Patient declines any irregular  heart rate/A. Fib/palpitation history however May 2020 patient had cardiology follow-up with Dr. Andrey Campanile for "irregular heartbeat" with no definitive diagnosis at that time  -patient currently declines history of A. fib although appears to be a somewhat chronic issue  Hypokalemia, moderate in the setting of diuresis are resolving -Continue to replete daily, follow with morning labs Lab Results  Component Value Date   NA 143 08/19/2019   K 4.0 08/19/2019   CL 96 (L) 08/19/2019   CO2 34 (H) 08/19/2019    Chronic left lower extremity venous stasis ulcer and left lower extremity wound, chronic, POA -Currently being followed in the outpatient wound care center -most recent evaluation 07/17/2019 with good report and no new antibiotics or treatment modalities -Wound care to follow in house  Acute on chronic ambulatory dysfunction in the setting of above - Follow along with PT OT -currently recommending SNF, patient agreeable at this time given profound weakness and difficulty with ambulation, at home his wife is his only caretaker and unable to assist him with weightbearing or ambulation.  AKI versus unknown CKD, POA, stable - Follow morning labs, no creatinine of note available per chart review - we will attempt to obtain records from outpatient Lab Results  Component Value Date   CREATININE 1.56 (H) 08/19/2019   CREATININE 1.67 (H) 08/18/2019   CREATININE 1.41 (H) 08/17/2019    Leukocytosis, mild, likely reactive, improving Follow with morning labs, likely reactive leukocytosis given above Certainly patient does have chronic lower extremity wound but remains afebrile and states the wound looks "better than it has in the past"  Hypertension Home medications include lisinopril, hydrochlorothiazide -these will be held given questionable AKI as above Metoprolol resumed as above  Hyperlipidemia Home atorvastatin  Basal cell carcinoma history Appears to be distant diagnosis, no acute issues/need for further work-up  Morbid obesity Body mass index is 23.42 kg/m.   DVT prophylaxis: Early ambulation, holding anticoagulation given hematoma noted in left leg  on imaging as above Code Status: Full, lengthy discussion about CPR and intubation at admission -we will follow up with wife and PCP goals of care discussion Family Communication: Wife previously updated Disposition Plan: Inpatient, awaiting discharge to SNF given profound weakness, need for close monitoring and ongoing physical therapy.  Case management aware, will continue to work with physical therapy for further recommendations and insight.  Patient likely stable for discharge to facility in the next 24 to 48 hours pending volume status, improvement in dyspnea, ambulatory dysfunction and Covid testing required for transfer to facility. Consults called: None Admission status: Inpatient, telemetry, continues to require multiple day stay given acute hypoxic respiratory failure in setting of volume overload given new diagnosis of heart failure as well as exacerbation of what appears to be paroxysmal A. fib with acutely worsening ambulatory dysfunction.  Patient requires IV diuresis, close monitoring in the setting of aggressive diuresis, further evaluation to diagnose etiology of sudden onset hypervolemia and acute hypoxic respiratory failure and physical therapy for ambulatory dysfunction.  Physical Exam: Vitals:   08/19/19 1321 08/19/19 2130 08/20/19 0632 08/20/19 0634  BP: 100/62 123/69 109/75   Pulse: 86 79 88   Resp: 20     Temp: 97.9 F (36.6 C) 98 F (36.7 C) 97.8 F (36.6 C)   TempSrc: Oral Oral Oral   SpO2: 97% 95% 95%   Weight:    71.9 kg  Height:        Constitutional: NAD, calm, comfortable Vitals:   08/19/19 1321 08/19/19 2130 08/20/19  4696 08/20/19 0634  BP: 100/62 123/69 109/75   Pulse: 86 79 88   Resp: 20     Temp: 97.9 F (36.6 C) 98 F (36.7 C) 97.8 F (36.6 C)   TempSrc: Oral Oral Oral   SpO2: 97% 95% 95%   Weight:    71.9 kg  Height:       General:  Pleasantly resting in bed, No acute distress.  Nontoxic in appearance. HEENT:  Normocephalic atraumatic.   Sclerae nonicteric, noninjected.  Extraocular movements intact bilaterally. Neck:  Without mass or deformity.  Trachea is midline. Lungs: Bibasilar rales without overt rhonchi or wheeze.  Tachypneic into the 20s without respiratory distress, no accessory muscle use Heart: Irregularly irregular.  Without murmurs, rubs, or gallops. Abdomen:  Soft, nontender, nondistended.  Without guarding or rebound. Extremities: Scant to 1+ pitting edema bilateral lower extremities Vascular: Without obvious JVD, radial pulses bilaterally intact, irregular Psych: Appropriate mood and insight, oriented x4  Labs on Admission: I have personally reviewed following labs and imaging studies  CBC: Recent Labs  Lab 08/16/19 1054 08/17/19 0413 08/18/19 0353 08/19/19 0421 08/20/19 0400  WBC 11.5* 9.8 12.4* 11.9* 13.6*  NEUTROABS 9.6*  --   --   --   --   HGB 14.0 13.7 15.2 17.1* 15.5  HCT 44.1 43.5 47.3 50.9 48.0  MCV 103.3* 102.6* 103.3* 99.6 100.6*  PLT 209 197 190 230 228   Basic Metabolic Panel: Recent Labs  Lab 08/16/19 1054 08/17/19 0413 08/18/19 0353 08/19/19 0421  NA 141 144 145 143  K 3.9 3.6 3.1* 4.0  CL 105 103 99 96*  CO2 27 32 34* 34*  GLUCOSE 89 99 102* 90  BUN 32* 34* 40* 44*  CREATININE 1.42* 1.41* 1.67* 1.56*  CALCIUM 8.8* 8.9 9.0 9.4  MG 2.0  --   --   --    GFR: Estimated Creatinine Clearance: 32.1 mL/min (A) (by C-G formula based on SCr of 1.56 mg/dL (H)). Liver Function Tests: Recent Labs  Lab 08/16/19 1054 08/17/19 0413 08/18/19 0353 08/19/19 0421  AST 30 26 27 30   ALT 24 22 23 26   ALKPHOS 78 76 79 83  BILITOT 1.9* 1.7* 1.6* 2.1*  PROT 6.3* 6.3* 6.6 6.9  ALBUMIN 3.5 3.3* 3.4* 3.5   No results for input(s): LIPASE, AMYLASE in the last 168 hours. No results for input(s): AMMONIA in the last 168 hours. Coagulation Profile: Recent Labs  Lab 08/16/19 1054  INR 1.3*   Cardiac Enzymes: No results for input(s): CKTOTAL, CKMB, CKMBINDEX, TROPONINI in the last 168  hours. BNP (last 3 results) No results for input(s): PROBNP in the last 8760 hours. HbA1C: No results for input(s): HGBA1C in the last 72 hours. CBG: No results for input(s): GLUCAP in the last 168 hours. Lipid Profile: No results for input(s): CHOL, HDL, LDLCALC, TRIG, CHOLHDL, LDLDIRECT in the last 72 hours. Thyroid Function Tests: No results for input(s): TSH, T4TOTAL, FREET4, T3FREE, THYROIDAB in the last 72 hours. Anemia Panel: No results for input(s): VITAMINB12, FOLATE, FERRITIN, TIBC, IRON, RETICCTPCT in the last 72 hours. Urine analysis:    Component Value Date/Time   COLORURINE STRAW (A) 08/16/2019 1954   APPEARANCEUR CLEAR 08/16/2019 1954   LABSPEC 1.009 08/16/2019 1954   PHURINE 6.0 08/16/2019 1954   GLUCOSEU NEGATIVE 08/16/2019 1954   HGBUR NEGATIVE 08/16/2019 1954   BILIRUBINUR NEGATIVE 08/16/2019 1954   KETONESUR NEGATIVE 08/16/2019 1954   PROTEINUR NEGATIVE 08/16/2019 1954   NITRITE NEGATIVE 08/16/2019 1954  LEUKOCYTESUR NEGATIVE 08/16/2019 1954    Radiological Exams on Admission: No results found.  EKG: Independently reviewed.  Irregular no notable P waves, consistent with atrial fibrillation, notable right bundle branch morphology laterally without overt ST elevation or depression, T wave inversion diffusely with occasional PVC  [facility]  If 7PM-7AM, please contact night-coverage [email or web address]   08/20/2019, 7:25 AM

## 2019-08-20 NOTE — NC FL2 (Signed)
  [place] MEDICAID FL2 LEVEL OF CARE SCREENING TOOL     IDENTIFICATION  Patient Name: Michael Haas Birthdate: [birth year] Sex: male Admission Date (Current Location): 08/16/2019  [facility] and [place] Number:  Producer, television/film/video and Address:  [facility],  [address] [place], [place] [address]      Provider Number: [id number]  Attending Physician Name and Address:  Azucena Fallen, MD  Relative Name and Phone Number:  Pinckney,Frances Spouse [phone]  [phone]    Current Level of Care: Hospital Recommended Level of Care: Skilled Nursing Facility Prior Approval Number:    Date Approved/Denied:   PASRR Number: [id number]  Discharge Plan: SNF    Current Diagnoses: Patient Active Problem List   Diagnosis Date Noted  . Acute respiratory failure with hypoxia (HCC) 08/16/2019  . Unspecified atrial fibrillation (HCC) 08/16/2019  . Venous stasis ulcer (HCC) 08/16/2019    Orientation RESPIRATION BLADDER Height & Weight     Self, Time, Situation, Place  Normal Continent Weight: 158 lb 9.6 oz (71.9 kg) Height:  5\' 9"  (175.3 cm)  BEHAVIORAL SYMPTOMS/MOOD NEUROLOGICAL BOWEL NUTRITION STATUS      Continent Diet(Renal diet, fluid restrictions 1200 Fl)  AMBULATORY STATUS COMMUNICATION OF NEEDS Skin   Limited Assist Verbally Normal                       Personal Care Assistance Level of Assistance  Bathing, Feeding, Dressing Bathing Assistance: Limited assistance Feeding assistance: Independent Dressing Assistance: Limited assistance     Functional Limitations Info  Sight, Speech, Hearing Sight Info: Adequate Hearing Info: Adequate Speech Info: Adequate    SPECIAL CARE FACTORS FREQUENCY  PT (By licensed PT), OT (By licensed OT)     PT Frequency: Minimum 5x a week OT Frequency: Minimum 5x a week            Contractures Contractures Info: Not present    Additional Factors Info  Code Status, Allergies Code Status Info: Full  Code Allergies Info: NKA           Current Medications (08/20/2019):  This is the current hospital active medication list Current Facility-Administered Medications  Medication Dose Route Frequency Provider Last Rate Last Admin  . atorvastatin (LIPITOR) tablet 40 mg  40 mg Oral Daily 10/20/2019, MD   40 mg at 08/20/19 1003  . furosemide (LASIX) tablet 20 mg  20 mg Oral BID 10/20/19, MD   20 mg at 08/20/19 1004  . metoprolol tartrate (LOPRESSOR) tablet 100 mg  100 mg Oral BID 10/20/19, MD   100 mg at 08/19/19 2008  . sodium chloride flush (NS) 0.9 % injection 3 mL  3 mL Intravenous Q12H 2009, MD   3 mL at 08/20/19 1006     Discharge Medications: Please see discharge summary for a list of discharge medications.  Relevant Imaging Results:  Relevant Lab Results:   Additional Information SSN 10/20/19  [id number], LCSW

## 2019-08-20 NOTE — TOC Progression Note (Signed)
Transition of Care [facility]) - Progression Note    Patient Details  Name: Michael Haas MRN: [id number] Date of Birth: 02/28/1929  Transition of Care [facility]) CM/SW Contact  Darleene Cleaver, [place] Phone Number: 08/20/2019, 11:48 AM  Clinical Narrative:     CSW presented bed offers to patient's wife and they chose [facility], CSW updated physician, patient will need a new Covid test, he will order one.  CSW updated [facility] [place] can accept patient tomorrow pending negative covid results.   Expected Discharge Plan: Skilled Nursing Facility    Expected Discharge Plan and Services Expected Discharge Plan: Skilled Nursing Facility In-house Referral: Clinical Social Work   Post Acute Care Choice: Skilled Nursing Facility Living arrangements for the past 2 months: Single Family Home                                       Social Determinants of Health (SDOH) Interventions    Readmission Risk Interventions No flowsheet data found.

## 2019-08-20 NOTE — Care Management Important Message (Signed)
Important Message  Patient Details IM Letter given to Windell Moulding SW Case Manager to present to the Patient Name: Michael Haas MRN: [id number] Date of Birth: [birth year]   Medicare Important Message Given:  Yes     Caren Macadam 08/20/2019, 12:04 PM

## 2019-08-20 NOTE — Plan of Care (Signed)
  Problem: Cardiac: Goal: Ability to achieve and maintain adequate cardiopulmonary perfusion will improve Outcome: Progressing   Problem: Education: Goal: Knowledge of General Education information will improve Description: Including pain rating scale, medication(s)/side effects and non-pharmacologic comfort measures Outcome: Progressing   Problem: Clinical Measurements: Goal: Ability to maintain clinical measurements within normal limits will improve Outcome: Progressing Goal: Will remain free from infection Outcome: Progressing Goal: Diagnostic test results will improve Outcome: Progressing Goal: Respiratory complications will improve Outcome: Progressing Goal: Cardiovascular complication will be avoided Outcome: Progressing   Problem: Nutrition: Goal: Adequate nutrition will be maintained Outcome: Progressing   Problem: Coping: Goal: Level of anxiety will decrease Outcome: Progressing   Problem: Elimination: Goal: Will not experience complications related to bowel motility Outcome: Progressing Goal: Will not experience complications related to urinary retention Outcome: Progressing   Problem: Pain Managment: Goal: General experience of comfort will improve Outcome: Progressing

## 2019-08-21 DIAGNOSIS — J9601 Acute respiratory failure with hypoxia: Secondary | ICD-10-CM | POA: Diagnosis not present

## 2019-08-21 LAB — COMPREHENSIVE METABOLIC PANEL
ALT: 25 U/L (ref 0–44)
AST: 29 U/L (ref 15–41)
Albumin: 2.9 g/dL — ABNORMAL LOW (ref 3.5–5.0)
Alkaline Phosphatase: 67 U/L (ref 38–126)
Anion gap: 12 (ref 5–15)
BUN: 52 mg/dL — ABNORMAL HIGH (ref 8–23)
CO2: 30 mmol/L (ref 22–32)
Calcium: 8.5 mg/dL — ABNORMAL LOW (ref 8.9–10.3)
Chloride: 97 mmol/L — ABNORMAL LOW (ref 98–111)
Creatinine, Ser: 1.43 mg/dL — ABNORMAL HIGH (ref 0.61–1.24)
GFR calc Af Amer: 50 mL/min — ABNORMAL LOW (ref >60)
GFR calc non Af Amer: 43 mL/min — ABNORMAL LOW (ref >60)
Glucose, Bld: 96 mg/dL (ref 70–99)
Potassium: 3.3 mmol/L — ABNORMAL LOW (ref 3.5–5.1)
Sodium: 139 mmol/L (ref 135–145)
Total Bilirubin: 2 mg/dL — ABNORMAL HIGH (ref 0.3–1.2)
Total Protein: 5.9 g/dL — ABNORMAL LOW (ref 6.5–8.1)

## 2019-08-21 LAB — CBC
HCT: 46.4 % (ref 39.0–52.0)
Hemoglobin: 15.1 g/dL (ref 13.0–17.0)
MCH: 32.7 pg (ref 26.0–34.0)
MCHC: 32.5 g/dL (ref 30.0–36.0)
MCV: 100.4 fL — ABNORMAL HIGH (ref 80.0–100.0)
Platelets: 234 10*3/uL (ref 150–400)
RBC: 4.62 MIL/uL (ref 4.22–5.81)
RDW: 14.5 % (ref 11.5–15.5)
WBC: 13.8 10*3/uL — ABNORMAL HIGH (ref 4.0–10.5)
nRBC: 0 % (ref 0.0–0.2)

## 2019-08-21 LAB — SARS CORONAVIRUS 2 (TAT 6-24 HRS): SARS Coronavirus 2: NEGATIVE

## 2019-08-21 MED ORDER — FUROSEMIDE 20 MG PO TABS: 20.0000 mg | ORAL_TABLET | Freq: Two times a day (BID) | ORAL | 0 refills | Status: DC

## 2019-08-21 MED ORDER — METOPROLOL TARTRATE 100 MG PO TABS: 50.0000 mg | ORAL_TABLET | Freq: Two times a day (BID) | ORAL | 0 refills | Status: DC

## 2019-08-21 NOTE — Plan of Care (Signed)
Patient is aware of safety plan. Bed/chair alarm in place. Pt will call for assistance when needing to get out of bed

## 2019-08-21 NOTE — Progress Notes (Signed)
Patient transported to facility. No change from am assessment. Personal belongings were taken by family. AVS given to family.

## 2019-08-21 NOTE — Progress Notes (Addendum)
Call placed three times to [phone] [facility]. Report given to [facility]. Questions and concerns denied at this time.

## 2019-08-21 NOTE — TOC Transition Note (Signed)
Transition of Care [facility]) - CM/SW Discharge Note   Patient Details  Name: Michael Haas MRN: [id number] Date of Birth: 1929/05/04  Transition of Care [facility]) CM/SW Contact:  Darleene Cleaver, LCSW Phone Number: 08/21/2019, 11:29 AM   Clinical Narrative:     CSW spoke with [facility], and they can accept patient today.  Patient to be d/c'ed today to [facility] room 32.  Patient and family agreeable to plans will transport via ems RN to call report [phone].  CSW contacted patient's wife and informed her that patient is discharging today.  Final next level of care: Skilled Nursing Facility Barriers to Discharge: Barriers Resolved   Patient Goals and CMS Choice Patient states their goals for this hospitalization and ongoing recovery are:: To go to SNF for short term rehab, then return back home with his wife. CMS Medicare.gov Compare Post Acute Care list provided to:: Patient Choice offered to / list presented to : Spouse  Discharge Placement PASRR number recieved: 08/19/19            Patient chooses bed at: [facility] Patient to be transferred to facility by: [facility] Name of family member notified: Patient's wife Michael Haas Patient and family notified of of transfer: 08/21/19  Discharge Plan and Services In-house Referral: Clinical Social Work   Post Acute Care Choice: Skilled Nursing Facility            DME Agency: NA                  Social Determinants of Health (SDOH) Interventions     Readmission Risk Interventions No flowsheet data found.

## 2019-08-21 NOTE — Progress Notes (Signed)
Occupational Therapy Treatment Patient Details Name: Michael Haas MRN: [id number] DOB: Dec 11, 1928 Today's Date: 08/21/2019    History of present illness 86 y.o. male with medical history significant of hypertension, hyperlipidemia, chronic venous stasis ulcers and chronic left wound currently being evaluated in the outpatient setting with wound care, basal cell carcinoma, with ambulatory dysfunction  who presents with left hip pain after mechanical fall while in the bathroom. Imaging of L hip negative for fx, hematoma noted. Dx of respiratory failure, volume overload, new onset afib.   OT comments  Pt making good progress with adls and is very motivated to be more independent. Addressed safety with sit to stand transfers and hand position as well as LE dressing and toileting.  Pt can do more than he thinks he can when not given the option for help.  Pt has memory deficits which require him to have supervision at all times.  Talked to wife at length about a tub bench for home and provided a picture to avoid falls when bathing.  Pt set to d/c today to SNF rehab.   Follow Up Recommendations  SNF;Supervision/Assistance - 24 hour    Equipment Recommendations  Tub/shower bench    Recommendations for Other Services      Precautions / Restrictions Precautions Precautions: Fall Precaution Comments: 3-4 falls recently; tripped on shoelace most recently Restrictions Weight Bearing Restrictions: No       Mobility Bed Mobility Overal bed mobility: Needs Assistance Bed Mobility: Supine to Sit     Supine to sit: Min guard     General bed mobility comments: bed flat and pt struggled to get to full sit but did not need physical assist.  Guarding given so he would not lay back down.  Transfers Overall transfer level: Needs assistance Equipment used: Rolling walker (2 wheeled) Transfers: Sit to/from [employer] Sit to Stand: Min guard Stand pivot transfers: Min assist        General transfer comment: min/guard for safety    Balance Overall balance assessment: History of Falls;Needs assistance Sitting-balance support: Feet supported Sitting balance-Leahy Scale: Good     Standing balance support: Bilateral upper extremity supported Standing balance-Leahy Scale: Poor Standing balance comment: poor dynamic                           ADL either performed or assessed with clinical judgement   ADL Overall ADL's : Needs assistance/impaired Eating/Feeding: Set up;Sitting   Grooming: Wash/dry hands;Wash/dry face;Min guard;Standing;Cueing for compensatory techniques Grooming Details (indicate cue type and reason): Pt stood with min guard just for safety and able to let go of walker to groom with min guard. Upper Body Bathing: Set up;Supervision/ safety;Sitting   Lower Body Bathing: Moderate assistance;Sit to/from stand Lower Body Bathing Details (indicate cue type and reason): Pt required min assist to stand and was bottom and mod assist to get to feet.  Upper Body Dressing : Set up;Supervision/safety;Sitting   Lower Body Dressing: Moderate assistance;Sit to/from stand;Cueing for compensatory techniques Lower Body Dressing Details (indicate cue type and reason): Pt donned socks with min assist to get L sock started and donned pants with min assist to start L pants over foot. Pt pulled pants up with  min guard and cues to not let go of walker with both hands at same time due to poor balance.  Toilet Transfer: Minimal assistance;Ambulation;BSC;RW Toilet Transfer Details (indicate cue type and reason): cues for hand placement and safety Toileting- Clothing  Manipulation and Hygiene: Minimal assistance;Sitting/lateral lean Toileting - Clothing Manipulation Details (indicate cue type and reason): min assist to get fully clean   Tub/Shower Transfer Details (indicate cue type and reason): Talked at length with wife about a tub BENCH.  Pictures provided.   Feel this would be beneficial to pt.  Functional mobility during ADLs: Min guard;Minimal assistance;Rolling walker General ADL Comments: Pt making good progress with adls and is motivated.  Pt has some safety concerns related to his cognition and memory that will require him to always have supervision but feel his independence is improving daily.     Vision   Vision Assessment?: No apparent visual deficits   Perception     Praxis      Cognition Arousal/Alertness: Awake/alert Behavior During Therapy: WFL for tasks assessed/performed Overall Cognitive Status: History of cognitive impairments - at baseline                                          Exercises     Shoulder Instructions       General Comments Pt making good improvements but requires cues for safety.    Pertinent Vitals/ Pain       Pain Assessment: No/denies pain  Home Living                                          Prior Functioning/Environment              Frequency  Min 2X/week        Progress Toward Goals  OT Goals(current goals can now be found in the care plan section)  Progress towards OT goals: Progressing toward goals  Acute Rehab OT Goals Patient Stated Goal: to get stronger OT Goal Formulation: With patient Time For Goal Achievement: 09/01/19 Potential to Achieve Goals: Good ADL Goals Pt Will Perform Grooming: with modified independence;standing Pt Will Perform Upper Body Dressing: with modified independence;sitting Pt Will Perform Lower Body Dressing: with min assist;sit to/from stand Pt Will Transfer to Toilet: with supervision;ambulating;regular height toilet Pt Will Perform Toileting - Clothing Manipulation and hygiene: with supervision;sit to/from stand;sitting/lateral leans Additional ADL Goal #1: Pt will verablize three fall prevention strategies with Min cues  Plan Discharge plan remains appropriate    Co-evaluation                  AM-PAC OT "6 Clicks" Daily Activity     Outcome Measure   Help from another person eating meals?: A Little Help from another person taking care of personal grooming?: A Little Help from another person toileting, which includes using toliet, bedpan, or urinal?: A Little Help from another person bathing (including washing, rinsing, drying)?: A Little Help from another person to put on and taking off regular upper body clothing?: A Little Help from another person to put on and taking off regular lower body clothing?: A Little 6 Click Score: 18    End of Session Equipment Utilized During Treatment: Rolling walker;Gait belt  OT Visit Diagnosis: Unsteadiness on feet (R26.81);Other abnormalities of gait and mobility (R26.89);Muscle weakness (generalized) (M62.81)   Activity Tolerance Patient tolerated treatment well   Patient Left Other (comment)(on BSC with wife and call bell. Nursing aware.)   Nurse Communication Mobility status  Time: 2023-3435 OT Time Calculation (min): 33 min  Charges: OT General Charges $OT Visit: 1 Visit OT Treatments $Self Care/Home Management : 23-37 mins   Glenford Peers 08/21/2019, 11:39 AM

## 2019-08-21 NOTE — Discharge Summary (Signed)
Physician Discharge Summary  Michael Haas HEN:[id number] DOB: 10-Oct-1928 DOA: 08/16/2019  PCP: Christain Sacramento, MD  Admit date: 08/16/2019 Discharge date: 08/21/2019  Admitted From: Home Disposition:  SNF  Recommendations for Outpatient Follow-up:  1. Follow up with PCP in 1-2 weeks 2. Please obtain BMP/CBC in one week  Discharge Condition: Stable CODE STATUS: Full  Diet recommendation: As tolerated - cardiac, low salt  Brief/Interim Summary: Michael Haas is a 86 y.o. male with medical history significant of hypertension, hyperlipidemia, chronic venous stasis ulcers and chronic left wound currently being evaluated in the outpatient setting with wound care, basal cell carcinoma, with ambulatory dysfunction  who presents with left hip pain after mechanical fall while in the bathroom.  Initially patient was unable to bear weight and felt that the pain would improve but over the past 12 hours the pain has not subsided and patient remains unable to ambulate at his usual baseline.  He denies any loss of consciousness syncope presyncope vertiginous symptoms but does indicate this was a mechanical fall, he did not strike his head or injure any other limbs but notes that he slipped and fell onto his left hip.  Review of systems otherwise remarkable for bilateral lower extremity edema, questionably worse than baseline as well as orthopnea, dyspnea with exertion but declines fevers, chills, nausea, vomiting, diarrhea, syncope, presyncope chest pain, sick contacts or recent travel. In the ED patient's lab work revealed a markedly elevated BNP over 1500, plain film of the left hip showed no overt findings, CT abdomen pelvis was also done showing what appears to be a hematoma.  Patient's telemetry and EKG are also concerning for A. fib which may be a new diagnosis for the patient.  While being interviewed in the emergency department patient required oxygen supplementation as his respiratory rate increased and his  oxygen levels decreased ultimately requiring upwards of 2 L nasal cannula to maintain sats above 90%.  In the ED patient received Lasix 20 IV x1.  With acute hypoxia in the setting of heart failure exacerbation with A. fib and RVR, diuresed quite well, now on p.o. diuretics, blood pressure somewhat soft, discontinue home lisinopril HCTZ, decrease home metoprolol to ensure ongoing adequate diuresis.  Patient has acute on chronic ambulatory dysfunction, PT recommended skilled nursing placement, Covid negative on 08/21/2019 patient otherwise stable and agreeable for discharge.  Close follow-up with PCP in the near future for ongoing evaluation and management of new diagnosis of heart failure as likely paroxysmal A. fib given chart review.  Patient is being held off anticoagulation now due to fall risk and left hip hematoma.  [id number] is 4, certainly reasonable to further discuss anticoagulation with PCP but patient did not want to initiate anticoagulation at this time given fall risk and hematoma.  Discharge Diagnoses:  Principal Problem:   Acute respiratory failure with hypoxia ([place]) Active Problems:   Unspecified atrial fibrillation ([facility])   Venous stasis ulcer [facility])    Discharge Instructions  Discharge Instructions    Amb Referral to HF Clinic   Complete by: As directed    Call MD for:  difficulty breathing, headache or visual disturbances   Complete by: As directed    Call MD for:  extreme fatigue   Complete by: As directed    Call MD for:  hives   Complete by: As directed    Call MD for:  persistant dizziness or light-headedness   Complete by: As directed    Call MD for:  persistant  nausea and vomiting   Complete by: As directed    Call MD for:  severe uncontrolled pain   Complete by: As directed    Call MD for:  temperature >100.4   Complete by: As directed    Diet - low sodium heart healthy   Complete by: As directed    Increase activity slowly   Complete by: As directed       Allergies as of 08/21/2019   No Known Allergies     Medication List    STOP taking these medications   lisinopril-hydrochlorothiazide 20-12.5 MG tablet Commonly known as: ZESTORETIC     TAKE these medications   aspirin 81 MG chewable tablet Chew 81 mg by mouth daily.   atorvastatin 40 MG tablet Commonly known as: LIPITOR Take 40 mg by mouth daily.   furosemide 20 MG tablet Commonly known as: LASIX Take 1 tablet (20 mg total) by mouth 2 (two) times daily.   metoprolol tartrate 100 MG tablet Commonly known as: LOPRESSOR Take 0.5 tablets (50 mg total) by mouth 2 (two) times daily. What changed: how much to take       No Known Allergies   Procedures/Studies: DG Chest 2 View  Result Date: 08/16/2019 CLINICAL DATA:  Un witnessed fall. EXAM: CHEST - 2 VIEW COMPARISON:  None. FINDINGS: Moderate right and small left pleural effusions. There is additional lung base opacity, also greater on the right, consistent with atelectasis. Remainder of the lungs is clear. No pneumothorax. Cardiac silhouette is mildly enlarged. No mediastinal or hilar masses. Skeletal structures are grossly intact. IMPRESSION: 1. Moderate right and small left pleural effusions with right greater than left lung base atelectasis. No convincing pneumonia and no evidence of pulmonary edema. 2. Mild cardiomegaly. Electronically Signed   By: Amie Portland M.D.   On: 08/16/2019 11:51   CT PELVIS WO CONTRAST  Result Date: 08/16/2019 CLINICAL DATA:  86 year old with left hip pain after fall. Cannot bear weight. Unwitnessed fall during the night when walking to the bathroom. EXAM: CT PELVIS WITHOUT CONTRAST TECHNIQUE: Multidetector CT imaging of the pelvis was performed following the standard protocol without intravenous contrast. COMPARISON:  Radiograph earlier this day. FINDINGS: Urinary Tract: Distal ureters are decompressed. Mildly distended urinary bladder with wall thickening at the base. Bowel:  Pelvic bowel loops  are unremarkable. Vascular/Lymphatic: Atherosclerosis of the distal aorta and iliac vessels. No bulky abdominopelvic adenopathy. Reproductive:  Enlarged prostate gland spanning 5.4 cm transverse. Other: Generalized body wall and intra-abdominal edema. No significant pelvic free fluid. Musculoskeletal: No evidence of acute fracture on CT. Femoral head is seated in the acetabulum. Pubic rami are intact. Mild degenerative change of the pubic symphysis and sacroiliac joints. Heterogeneous enlargement of the left gluteus maximus muscle. Generalized body wall edema, confluent in the flanks but no focal subcutaneous soft tissue hematoma. IMPRESSION: 1. No evidence of acute fracture on CT. If there is high clinical suspicion for occult hip fracture or the patient refuses to weightbear, consider further evaluation with MRI. 2. Heterogeneous enlargement of the left gluteus maximus muscle suspicious for intramuscular hematoma in the setting of trauma. 3. Generalized body wall edema. 4. Enlarged prostate gland causing mass effect on the bladder base. Aortic Atherosclerosis (ICD10-I70.0). Electronically Signed   By: Narda Rutherford M.D.   On: 08/16/2019 14:11   ECHOCARDIOGRAM COMPLETE  Result Date: 08/17/2019    ECHOCARDIOGRAM REPORT   Patient Name:   BARACK NICODEMUS Date of Exam: 08/17/2019 Medical Rec #:  [id number]  Height:       69.0 in Accession #:    [id number]    Weight:       181.4 lb Date of Birth:  02-11-1929     BSA:          1.982 m Patient Age:    86 years      BP:           124/84 mmHg Patient Gender: M             HR:           118 bpm. Exam Location:  Inpatient Procedure: 2D Echo, Cardiac Doppler and Color Doppler Indications:    Acute respiratory failure; R06.02 SOB  History:        Patient has no prior history of Echocardiogram examinations.                 Abnormal ECG, Arrythmias:Atrial Fibrillation,                 Signs/Symptoms:Dyspnea and Shortness of Breath; Risk                 Factors:Hypertension  and Dyslipidemia. Hypoxia.  Sonographer:    Sheralyn Boatman RDCS Referring Phys: [id number] Azucena Fallen  Sonographer Comments: Technically difficult study due to poor echo windows. Image acquisition challenging due to patient body habitus. IMPRESSIONS  1. Left ventricular ejection fraction, by estimation, is 45 to 50%. The left ventricle has mildly decreased function. The left ventricle demonstrates global hypokinesis. There is mild concentric left ventricular hypertrophy. Left ventricular diastolic function could not be evaluated.  2. Right ventricular systolic function is normal. The right ventricular size is normal. There is mildly elevated pulmonary artery systolic pressure.  3. Left atrial size was mildly dilated.  4. Right atrial size was mildly dilated.  5. The mitral valve is normal in structure. No evidence of mitral valve regurgitation.  6. Tricuspid valve regurgitation is mild to moderate.  7. The aortic valve is tricuspid. Aortic valve regurgitation is not visualized. Mild aortic valve stenosis.  8. The inferior vena cava is normal in size with <50% respiratory variability, suggesting right atrial pressure of 8 mmHg. FINDINGS  Left Ventricle: Left ventricular ejection fraction, by estimation, is 45 to 50%. The left ventricle has mildly decreased function. The left ventricle demonstrates global hypokinesis. The left ventricular internal cavity size was normal in size. There is  mild concentric left ventricular hypertrophy. Left ventricular diastolic function could not be evaluated due to atrial fibrillation. Left ventricular diastolic function could not be evaluated. Right Ventricle: The right ventricular size is normal. No increase in right ventricular wall thickness. Right ventricular systolic function is normal. There is mildly elevated pulmonary artery systolic pressure. The tricuspid regurgitant velocity is 2.77  m/s, and with an assumed right atrial pressure of 8 mmHg, the estimated right ventricular  systolic pressure is 38.7 mmHg. Left Atrium: Left atrial size was mildly dilated. Right Atrium: Right atrial size was mildly dilated. Pericardium: There is no evidence of pericardial effusion. Mitral Valve: The mitral valve is normal in structure. There is mild thickening of the mitral valve leaflet(s). No evidence of mitral valve regurgitation. Tricuspid Valve: The tricuspid valve is normal in structure. Tricuspid valve regurgitation is mild to moderate. Aortic Valve: The aortic valve is tricuspid. . There is moderate thickening and moderate calcification of the aortic valve. Aortic valve regurgitation is not visualized. Mild aortic stenosis is present. There is moderate thickening of the aortic valve.  There is moderate calcification of the aortic valve. Aortic valve mean gradient measures 8.0 mmHg. Aortic valve peak gradient measures 14.1 mmHg. Aortic valve area, by VTI measures 1.69 cm. Pulmonic Valve: The pulmonic valve was grossly normal. Pulmonic valve regurgitation is mild. Aorta: The aortic root is normal in size and structure. Venous: The inferior vena cava is normal in size with less than 50% respiratory variability, suggesting right atrial pressure of 8 mmHg. IAS/Shunts: The interatrial septum appears to be lipomatous. No atrial level shunt detected by color flow Doppler.  LEFT VENTRICLE PLAX 2D LVIDd:         4.33 cm LVIDs:         2.78 cm LV PW:         1.32 cm LV IVS:        1.16 cm LVOT diam:     1.90 cm LV SV:         49 LV SV Index:   25 LVOT Area:     2.84 cm  LV Volumes (MOD) LV vol d, MOD A2C: 41.6 ml LV vol d, MOD A4C: 50.6 ml LV vol s, MOD A2C: 25.5 ml LV vol s, MOD A4C: 24.0 ml LV SV MOD A2C:     16.1 ml LV SV MOD A4C:     50.6 ml LV SV MOD BP:      21.4 ml RIGHT VENTRICLE         IVC TAPSE (M-mode): 1.2 cm  IVC diam: 1.59 cm LEFT ATRIUM             Index       RIGHT ATRIUM           Index LA diam:        4.30 cm 2.17 cm/m  RA Area:     21.50 cm LA Vol (A2C):   64.4 ml 32.49 ml/m RA  Volume:   67.30 ml  33.95 ml/m LA Vol (A4C):   88.0 ml 44.39 ml/m LA Biplane Vol: 76.6 ml 38.64 ml/m  AORTIC VALVE                    PULMONIC VALVE AV Area (Vmax):    1.93 cm     PR End Diast Vel: 1.92 msec AV Area (Vmean):   1.83 cm AV Area (VTI):     1.69 cm AV Vmax:           188.00 cm/s AV Vmean:          130.500 cm/s AV VTI:            0.291 m AV Peak Grad:      14.1 mmHg AV Mean Grad:      8.0 mmHg LVOT Vmax:         128.00 cm/s LVOT Vmean:        84.000 cm/s LVOT VTI:          0.173 m LVOT/AV VTI ratio: 0.59  AORTA Ao Root diam: 3.40 cm MITRAL VALVE               TRICUSPID VALVE MV Area (PHT): 5.97 cm    TR Peak grad:   30.7 mmHg MV Decel Time: 127 msec    TR Vmax:        277.00 cm/s MV E velocity: 75.20 cm/s  SHUNTS                            Systemic VTI:  0.17 m                            Systemic Diam: 1.90 cm Thurmon Fair MD Electronically signed by Thurmon Fair MD Signature Date/Time: 08/17/2019/5:01:24 PM    Final    DG Hip Unilat W or Wo Pelvis 2-3 Views Left  Result Date: 08/16/2019 CLINICAL DATA:  Un witnessed fall.  Left hip pain. EXAM: DG HIP (WITH OR WITHOUT PELVIS) 2-3V LEFT COMPARISON:  None. FINDINGS: No fracture or bone lesion. Hip joint is normally spaced and aligned.  No arthropathic changes. Soft tissues are unremarkable. IMPRESSION: Negative. Electronically Signed   By: Amie Portland M.D.   On: 08/16/2019 11:51    Subjective: No acute issues or events overnight, tolerating p.o. quite well, continues to appear euvolemic, denies chest pain, shortness of breath, nausea, vomiting, diarrhea, constipation, headache, fevers, chills.  Discharge Exam: Vitals:   08/21/19 0526 08/21/19 1012  BP: 136/73 96/68  Pulse: 69 77  Resp:    Temp: 98 F (36.7 C)   SpO2: 92%    Vitals:   08/20/19 2225 08/21/19 0357 08/21/19 0526 08/21/19 1012  BP: 118/85  136/73 96/68  Pulse: 89  69 77  Resp:      Temp:   98 F (36.7 C)   TempSrc:   Oral   SpO2:    92%   Weight:  72.1 kg    Height:        General:  Pleasantly resting in bed, No acute distress. HEENT:  Normocephalic atraumatic.  Sclerae nonicteric, noninjected.  Extraocular movements intact bilaterally. Neck:  Without mass or deformity.  Trachea is midline. Lungs:  Clear to auscultate bilaterally without rhonchi, wheeze, or rales. Heart:  Regular rate and rhythm.  Without murmurs, rubs, or gallops. Abdomen:  Soft, nontender, nondistended.  Without guarding or rebound. Extremities: Without cyanosis, clubbing, 1+ edema, or obvious deformity. Vascular:  Dorsalis pedis and posterior tibial pulses palpable bilaterally. Skin:  Warm and dry, no erythema, no ulcerations.   The results of significant diagnostics from this hospitalization (including imaging, microbiology, ancillary and laboratory) are listed below for reference.     Microbiology: Recent Results (from the past 240 hour(s))  Urine culture     Status: None   Collection Time: 08/16/19  7:54 PM   Specimen: Urine, Random  Result Value Ref Range Status   Specimen Description   Final    URINE, RANDOM Performed at [facility], [address], [place], [place] [address]    Special Requests   Final    NONE Performed at [facility], [address], [place], [place] [address]    Culture   Final    NO GROWTH Performed at [facility], [address], [place], [place] [address]    Report Status 08/18/2019 FINAL  Final  SARS CORONAVIRUS 2 (TAT 6-24 HRS) Nasopharyngeal Nasopharyngeal Swab     Status: None   Collection Time: 08/16/19  9:15 PM   Specimen: Nasopharyngeal Swab  Result Value Ref Range Status   SARS Coronavirus 2 NEGATIVE NEGATIVE Final    Comment: (NOTE) SARS-CoV-2 target nucleic acids are NOT DETECTED. The SARS-CoV-2 RNA is generally detectable in upper and lower respiratory specimens during the acute phase of  infection. Negative results do not preclude SARS-CoV-2  infection, do not rule out co-infections with other pathogens, and should not be used as the sole basis for treatment or other patient management decisions. Negative results must be combined with clinical observations, patient history, and epidemiological information. The expected result is Negative. Fact Sheet for Patients: HairSlick.no Fact Sheet for Healthcare Providers: quierodirigir.com This test is not yet approved or cleared by the [place] FDA and  has been authorized for detection and/or diagnosis of SARS-CoV-2 by FDA under an Emergency Use Authorization (EUA). This EUA will remain  in effect (meaning this test can be used) for the duration of the COVID-19 declaration under Section 56 4(b)(1) of the Act, 21 U.S.C. section 360bbb-3(b)(1), unless the authorization is terminated or revoked sooner. Performed at [facility], [address] [place], [place] [address]   SARS CORONAVIRUS 2 (TAT 6-24 HRS) Nasopharyngeal Nasopharyngeal Swab     Status: None   Collection Time: 08/20/19 11:48 AM   Specimen: Nasopharyngeal Swab  Result Value Ref Range Status   SARS Coronavirus 2 NEGATIVE NEGATIVE Final    Comment: (NOTE) SARS-CoV-2 target nucleic acids are NOT DETECTED. The SARS-CoV-2 RNA is generally detectable in upper and lower respiratory specimens during the acute phase of infection. Negative results do not preclude SARS-CoV-2 infection, do not rule out co-infections with other pathogens, and should not be used as the sole basis for treatment or other patient management decisions. Negative results must be combined with clinical observations, patient history, and epidemiological information. The expected result is Negative. Fact Sheet for Patients: HairSlick.no Fact Sheet for Healthcare Providers: quierodirigir.com This test is not yet approved or cleared by  the [place] FDA and  has been authorized for detection and/or diagnosis of SARS-CoV-2 by FDA under an Emergency Use Authorization (EUA). This EUA will remain  in effect (meaning this test can be used) for the duration of the COVID-19 declaration under Section 56 4(b)(1) of the Act, 21 U.S.C. section 360bbb-3(b)(1), unless the authorization is terminated or revoked sooner. Performed at [facility], [address], [place], [place] [address]      Labs: BNP (last 3 results) Recent Labs    08/16/19 1054  BNP 1,896.9*   Basic Metabolic Panel: Recent Labs  Lab 08/16/19 1054 08/16/19 1054 08/17/19 0413 08/18/19 0353 08/19/19 0421 08/20/19 0400 08/21/19 0400  NA 141   < > 144 145 143 141 139  K 3.9   < > 3.6 3.1* 4.0 3.4* 3.3*  CL 105   < > 103 99 96* 95* 97*  CO2 27   < > 32 34* 34* 32 30  GLUCOSE 89   < > 99 102* 90 91 96  BUN 32*   < > 34* 40* 44* 54* 52*  CREATININE 1.42*   < > 1.41* 1.67* 1.56* 1.59* 1.43*  CALCIUM 8.8*   < > 8.9 9.0 9.4 8.8* 8.5*  MG 2.0  --   --   --   --   --   --    < > = values in this interval not displayed.   Liver Function Tests: Recent Labs  Lab 08/17/19 0413 08/18/19 0353 08/19/19 0421 08/20/19 0400 08/21/19 0400  AST ALT ALKPHOS 76 79 83 75 67  BILITOT 1.7* 1.6* 2.1* 1.9* 2.0*  PROT 6.3* 6.6 6.9 6.4* 5.9*  ALBUMIN 3.3* 3.4* 3.5 3.3* 2.9*   No results  for input(s): LIPASE, AMYLASE in the last 168 hours. No results for input(s): AMMONIA in the last 168 hours. CBC: Recent Labs  Lab 08/16/19 1054 08/16/19 1054 08/17/19 0413 08/18/19 0353 08/19/19 0421 08/20/19 0400 08/21/19 0400  WBC 11.5*   < > 9.8 12.4* 11.9* 13.6* 13.8*  NEUTROABS 9.6*  --   --   --   --   --   --   HGB 14.0   < > 13.7 15.2 17.1* 15.5 15.1  HCT 44.1   < > 43.5 47.3 50.9 48.0 46.4  MCV 103.3*   < > 102.6* 103.3* 99.6 100.6* 100.4*  PLT 209   < > 197 190 230 228 234   < > = values in this interval not displayed.    Cardiac Enzymes: No results for input(s): CKTOTAL, CKMB, CKMBINDEX, TROPONINI in the last 168 hours. BNP: Invalid input(s): POCBNP CBG: No results for input(s): GLUCAP in the last 168 hours. D-Dimer No results for input(s): DDIMER in the last 72 hours. Hgb A1c No results for input(s): HGBA1C in the last 72 hours. Lipid Profile No results for input(s): CHOL, HDL, LDLCALC, TRIG, CHOLHDL, LDLDIRECT in the last 72 hours. Thyroid function studies No results for input(s): TSH, T4TOTAL, T3FREE, THYROIDAB in the last 72 hours.  Invalid input(s): FREET3 Anemia work up No results for input(s): VITAMINB12, FOLATE, FERRITIN, TIBC, IRON, RETICCTPCT in the last 72 hours. Urinalysis    Component Value Date/Time   COLORURINE STRAW (A) 08/16/2019 1954   APPEARANCEUR CLEAR 08/16/2019 1954   LABSPEC 1.009 08/16/2019 1954   PHURINE 6.0 08/16/2019 1954   GLUCOSEU NEGATIVE 08/16/2019 1954   HGBUR NEGATIVE 08/16/2019 1954   BILIRUBINUR NEGATIVE 08/16/2019 1954   KETONESUR NEGATIVE 08/16/2019 1954   PROTEINUR NEGATIVE 08/16/2019 1954   NITRITE NEGATIVE 08/16/2019 1954   LEUKOCYTESUR NEGATIVE 08/16/2019 1954   Sepsis Labs Invalid input(s): PROCALCITONIN,  WBC,  LACTICIDVEN Microbiology Recent Results (from the past 240 hour(s))  Urine culture     Status: None   Collection Time: 08/16/19  7:54 PM   Specimen: Urine, Random  Result Value Ref Range Status   Specimen Description   Final    URINE, RANDOM Performed at [facility], [address], [place], [place] [address]    Special Requests   Final    NONE Performed at [facility], [address], [place], [place] [address]    Culture   Final    NO GROWTH Performed at [facility], [address] [place], [place] [address]    Report Status 08/18/2019 FINAL  Final  SARS CORONAVIRUS 2 (TAT 6-24 HRS) Nasopharyngeal Nasopharyngeal Swab     Status: None   Collection Time: 08/16/19  9:15 PM    Specimen: Nasopharyngeal Swab  Result Value Ref Range Status   SARS Coronavirus 2 NEGATIVE NEGATIVE Final    Comment: (NOTE) SARS-CoV-2 target nucleic acids are NOT DETECTED. The SARS-CoV-2 RNA is generally detectable in upper and lower respiratory specimens during the acute phase of infection. Negative results do not preclude SARS-CoV-2 infection, do not rule out co-infections with other pathogens, and should not be used as the sole basis for treatment or other patient management decisions. Negative results must be combined with clinical observations, patient history, and epidemiological information. The expected result is Negative. Fact Sheet for Patients: HairSlick.no Fact Sheet for Healthcare Providers: quierodirigir.com This test is not yet approved or cleared by the [place] FDA and  has been authorized for detection  and/or diagnosis of SARS-CoV-2 by FDA under an Emergency Use Authorization (EUA). This EUA will remain  in effect (meaning this test can be used) for the duration of the COVID-19 declaration under Section 56 4(b)(1) of the Act, 21 U.S.C. section 360bbb-3(b)(1), unless the authorization is terminated or revoked sooner. Performed at [facility], [address] [place], [place] [address]   SARS CORONAVIRUS 2 (TAT 6-24 HRS) Nasopharyngeal Nasopharyngeal Swab     Status: None   Collection Time: 08/20/19 11:48 AM   Specimen: Nasopharyngeal Swab  Result Value Ref Range Status   SARS Coronavirus 2 NEGATIVE NEGATIVE Final    Comment: (NOTE) SARS-CoV-2 target nucleic acids are NOT DETECTED. The SARS-CoV-2 RNA is generally detectable in upper and lower respiratory specimens during the acute phase of infection. Negative results do not preclude SARS-CoV-2 infection, do not rule out co-infections with other pathogens, and should not be used as the sole basis for treatment or other patient management  decisions. Negative results must be combined with clinical observations, patient history, and epidemiological information. The expected result is Negative. Fact Sheet for Patients: HairSlick.nohttps://www.fda.gov/media/138098/download Fact Sheet for Healthcare Providers: quierodirigir.comhttps://www.fda.gov/media/138095/download This test is not yet approved or cleared by the [place] FDA and  has been authorized for detection and/or diagnosis of SARS-CoV-2 by FDA under an Emergency Use Authorization (EUA). This EUA will remain  in effect (meaning this test can be used) for the duration of the COVID-19 declaration under Section 56 4(b)(1) of the Act, 21 U.S.C. section 360bbb-3(b)(1), unless the authorization is terminated or revoked sooner. Performed at [facility], [address] [place], [place] [address]     Time coordinating discharge: Over 30 minutes  SIGNED:  Azucena FallenWilliam C Shenae Bonanno, DO [facility] 08/21/2019, 10:39 AM Pager: Secure chat/amion

## 2019-08-31 ENCOUNTER — Telehealth: Payer: Self-pay

## 2019-08-31 NOTE — Telephone Encounter (Signed)
NOTES ON FILE FROM [facility] [phone], SENT REFERRAL TO SCHEDULING °

## 2019-09-13 ENCOUNTER — Other Ambulatory Visit: Payer: Self-pay

## 2019-09-13 ENCOUNTER — Encounter: Payer: Self-pay | Admitting: Cardiology

## 2019-09-13 ENCOUNTER — Ambulatory Visit (INDEPENDENT_AMBULATORY_CARE_PROVIDER_SITE_OTHER): Payer: Medicare Other | Admitting: Cardiology

## 2019-09-13 VITALS — BP 128/84 | HR 104 | Ht 68.0 in | Wt 160.4 lb

## 2019-09-13 DIAGNOSIS — I4819 Other persistent atrial fibrillation: Secondary | ICD-10-CM

## 2019-09-13 DIAGNOSIS — R600 Localized edema: Secondary | ICD-10-CM | POA: Diagnosis not present

## 2019-09-13 DIAGNOSIS — I5042 Chronic combined systolic (congestive) and diastolic (congestive) heart failure: Secondary | ICD-10-CM | POA: Diagnosis not present

## 2019-09-13 DIAGNOSIS — Z7189 Other specified counseling: Secondary | ICD-10-CM | POA: Diagnosis not present

## 2019-09-13 DIAGNOSIS — Z79899 Other long term (current) drug therapy: Secondary | ICD-10-CM

## 2019-09-13 MED ORDER — ATORVASTATIN CALCIUM 40 MG PO TABS: 40.0000 mg | ORAL_TABLET | Freq: Every day | ORAL | 3 refills | Status: DC

## 2019-09-13 MED ORDER — METOPROLOL SUCCINATE ER 100 MG PO TB24: 100.0000 mg | ORAL_TABLET | Freq: Every day | ORAL | 3 refills | Status: DC

## 2019-09-13 MED ORDER — FUROSEMIDE 20 MG PO TABS: 20.0000 mg | ORAL_TABLET | Freq: Every day | ORAL | 3 refills | Status: DC

## 2019-09-13 NOTE — Progress Notes (Signed)
Cardiology Office Note:    Date:  09/13/2019   ID:  Michael Haas, DOB [birth year], MRN [id number]  PCP:  Michael Haas, Michael H, MD  Cardiologist:  Michael RedBridgette Catha Ontko, MD  Referring MD: Michael Haas, Michael R, NP   CC: new patient evaluation for atrial fibrillation and heart failure  History of Present Illness:    Michael Haas is a 86 y.o. male with a hx of hypertension, hyperlipidemia, chronic venous stasis ulcers who is seen as a new consult at the request of Michael Haas, Michael R, NP for the evaluation and management of atrial fibrillation and heart failure.  Hospitalization and recent discharge summary from 08/21/19 reviewed. Admitted for mechanical falls. Evaluation notable for elevated BNP, and his telemetry was concerning for atrial fibrillation. He was not started on anticoagulation given recent mechanical fall. He was recommended to discuss anticoagulation with his PCP Dr. Andrey CampanileWilson, but it does not appear that he has had that visit.  Today: Here with his wife today. They feel that very little was explained during their hospitalization. Overall we spent significant time today on education re: afib and heart failure.  Mr. Michael Haas has completed rehab, feeling better, getting strength back. Wearing compression stockings. Weight in the hospital was in the 190s, today is 160 lbs. Legs much less swollen, getting around much better.  Legs had been swollen for months prior to admission, was having weeping from some of the sites. Denies any prior cardiac history. Wife was frustrated, felt that nothing was done to address the swelling until he was admitted to the hospital.  Spent extensive time today counseling on heart failure education and diet/fluid recommendations.  Denies chest pain, shortness of breath at rest or with normal exertion. No PND, orthopnea, or unexpected weight gain. No syncope or palpitations.  Discussed anticoagulation, he is already an easy bleeder. They will think about this. Discussed  stroke risk extensively.   Past Medical History:  Diagnosis Date  . Hyperlipidemia   . Hypertension     Past Surgical History:  Procedure Laterality Date  . HERNIA REPAIR    . left hand surgery     "It was caught in a machine years ago"    Current Medications: Current Outpatient Medications on File Prior to Visit  Medication Sig  . aspirin 81 MG chewable tablet Chew 81 mg by mouth daily.   No current facility-administered medications on file prior to visit.     Allergies:   Patient has no known allergies.   Social History   Tobacco Use  . Smoking status: Never Smoker  . Smokeless tobacco: Former NeurosurgeonUser    Types: Chew  Substance Use Topics  . Alcohol use: Not Currently    Comment: last time 20 years ago  . Drug use: Never    Family History: family history is not on file.  ROS:   Please see the history of present illness.  Additional pertinent ROS: Constitutional: Negative for chills, fever, night sweats, unintentional weight loss  HENT: Negative for ear pain and hearing loss.   Eyes: Negative for loss of vision and eye pain.  Respiratory: Negative for cough, sputum, wheezing.   Cardiovascular: See HPI. Gastrointestinal: Negative for abdominal pain, melena, and hematochezia.  Genitourinary: Negative for dysuria and hematuria.  Musculoskeletal: Negative for falls and myalgias.  Skin: Negative for itching and rash.  Neurological: Negative for focal weakness, focal sensory changes and loss of consciousness.  Endo/Heme/Allergies: Does not bruise/bleed easily.     EKGs/Labs/Other Studies Reviewed:  The following studies were reviewed today: Echo 08/17/19 1. Left ventricular ejection fraction, by estimation, is 45 to 50%. The left ventricle has mildly decreased function. The left ventricle demonstrates global hypokinesis. There is mild concentric left ventricular hypertrophy. Left ventricular diastolic function could not be evaluated.  2. Right ventricular systolic  function is normal. The right ventricular size is normal. There is mildly elevated pulmonary artery systolic pressure.  3. Left atrial size was mildly dilated.  4. Right atrial size was mildly dilated.  5. The mitral valve is normal in structure. No evidence of mitral valve regurgitation.  6. Tricuspid valve regurgitation is mild to moderate.  7. The aortic valve is tricuspid. Aortic valve regurgitation is not visualized. Mild aortic valve stenosis.  8. The inferior vena cava is normal in size with <50% respiratory variability, suggesting right atrial pressure of 8 mmHg.   EKG:  EKG is personally reviewed.  The ekg ordered today demonstrates atrial fibrillaiton, PVCs vs aberrant conduction  Recent Labs: 08/16/2019: B Natriuretic Peptide 1,896.9; Magnesium 2.0 08/21/2019: ALT 25; BUN 52; Creatinine, Ser 1.43; Hemoglobin 15.1; Platelets 234; Potassium 3.3; Sodium 139  Recent Lipid Panel No results found for: CHOL, TRIG, HDL, CHOLHDL, VLDL, LDLCALC, LDLDIRECT  Physical Exam:    VS:  BP 128/84   Pulse (!) 104   Ht 5\' 8"  (1.727 m)   Wt 160 lb 6.4 oz (72.8 kg)   BMI 24.39 kg/m     Wt Readings from Last 3 Encounters:  09/13/19 160 lb 6.4 oz (72.8 kg)  08/21/19 158 lb 15.2 oz (72.1 kg)    GEN: Well nourished, well developed in no acute distress HEENT: Normal, moist mucous membranes NECK: No JVD CARDIAC: irregularly irregular rhythm, normal S1 and S2, no rubs or gallops. No murmurs. VASCULAR: Radial and DP pulses 2+ bilaterally. No carotid bruits RESPIRATORY:  Clear to auscultation without rales, wheezing or rhonchi  ABDOMEN: Soft, non-tender, non-distended MUSCULOSKELETAL:  Ambulates independently SKIN: Warm and dry, bilateral trace-1+ edema L>Haas, compression stockings in place NEUROLOGIC:  Alert and oriented x 3. No focal neuro deficits noted. PSYCHIATRIC:  Normal affect    ASSESSMENT:    1. Persistent atrial fibrillation (HCC)   2. Chronic combined systolic and diastolic heart  failure (HCC)   3. Encounter for education about heart failure   4. Bilateral leg edema   5. Cardiac risk counseling   6. Medication management    PLAN:    Atrial fibrillation: likely persistent given recent admission. Denies symptoms -CHA2DS2/VAS Stroke Risk Points=4 -we discussed anticoagulation at length, risk of stroke. He is an easy bleeder and is concerned -will check BMET today to determine the proper dose of apixaban -drop aspirin if he starts anticoagulation -they will discuss and let me know at follow up -continue rate control with metoprolol, change to succinate given reduced EF -cannot rhythm control unless he starts anticoagulation  Chronic systolic and diastolic heart failure: with months of LE edema prior to recent admission -denies any prior history -no chest pain ever -has lost 30 lbs of fluid, feeling much better -extensive heart failure education, see below -will decrease lasix to daily, check BMET -changing to metoprolol succinate today -no BP room for ACEi/ARB/ARNI -recheck echo once either rate/rhythm controlled and euvolemic. If still reduced, consider ischemia workup, will need to discuss further  Cardiac risk counseling and prevention recommendations: -recommend heart healthy/Mediterranean diet, with whole grains, fruits, vegetable, fish, lean meats, nuts, and olive oil. Limit salt. -recommend moderate walking, 3-5 times/week for 30-50  minutes each session. Aim for at least 150 minutes.week. Goal should be pace of 3 miles/hours, or walking 1.5 miles in 30 minutes -recommend avoidance of tobacco products. Avoid excess alcohol.  Plan for follow up: 2 weeks, to discuss next steps  Michael Red, MD, PhD [place]  [facility]    Medication Adjustments/Labs and Tests Ordered: Current medicines are reviewed at length with the patient today.  Concerns regarding medicines are outlined above.  Orders Placed This Encounter  Procedures  . Basic  metabolic panel  . CBC  . EKG 12-Lead   Meds ordered this encounter  Medications  . atorvastatin (LIPITOR) 40 MG tablet    Sig: Take 1 tablet (40 mg total) by mouth daily.    Dispense:  90 tablet    Refill:  3  . furosemide (LASIX) 20 MG tablet    Sig: Take 1 tablet (20 mg total) by mouth daily.    Dispense:  90 tablet    Refill:  3  . metoprolol succinate (TOPROL-XL) 100 MG 24 hr tablet    Sig: Take 1 tablet (100 mg total) by mouth daily. Take with or immediately following a meal.    Dispense:  90 tablet    Refill:  3    Patient Instructions  Medication Instructions:  Stop taking Metoprolol tartrate 50 mg twice a day Start taking Metoprolol Succinate 100 mg daily Decrease Lasix to 20 mg daily  *If you need a refill on your cardiac medications before your next appointment, please call your pharmacy*   Lab Work: Your physician recommends that you return for lab work today (CBC, BMP)  If you have labs (blood work) drawn today and your tests are completely normal, you will receive your results only by: Marland Kitchen MyChart Message (if you have MyChart) OR . A paper copy in the mail If you have any lab test that is abnormal or we need to change your treatment, we will call you to review the results.   Testing/Procedures: None   Follow-Up: At [facility], you and your health needs are our priority.  As part of our continuing mission to provide you with exceptional heart care, we have created designated Provider Care Teams.  These Care Teams include your primary Cardiologist (physician) and Advanced Practice Providers (APPs -  Physician Assistants and Nurse Practitioners) who all work together to provide you with the care you need, when you need it.  We recommend signing up for the patient portal called "MyChart".  Sign up information is provided on this After Visit Summary.  MyChart is used to connect with patients for Virtual Visits (Telemedicine).  Patients are able to view lab/test  results, encounter notes, upcoming appointments, etc.  Non-urgent messages can be sent to your provider as well.   To learn more about what you can do with MyChart, go to [email or web address].    Your next appointment:   2 week(s)  The format for your next appointment:   Virtual Visit   Provider:   Jodelle Red, MD    Do the following things EVERY DAY:  1) Weigh yourself EVERY morning after you go to the bathroom but before you eat or drink anything. Write this number down in a weight log/diary. If you gain 3 pounds overnight or 5 pounds in a week, call the office.  2) Take your medicines as prescribed. If you have concerns about your medications, please call us before you stop taking them.   3) Eat low salt foods-Limit salt (  sodium) to 2000 mg per day. This will help prevent your body from holding onto fluid. Read food labels as many processed foods have a lot of sodium, especially canned goods and prepackaged meats. If you would like some assistance choosing low sodium foods, we would be happy to set you up with a nutritionist.  4) Stay as active as you can everyday. Staying active will give you more energy and make your muscles stronger. Start with 5 minutes at a time and work your way up to 30 minutes a day. Break up your activities--do some in the morning and some in the afternoon. Start with 3 days per week and work your way up to 5 days as you can.  If you have chest pain, feel short of breath, dizzy, or lightheaded, STOP. If you don't feel better after a short rest, call 911. If you do feel better, call the office to let us know you have symptoms with exercise.  5) Limit all fluids for the day to less than 2 liters. Fluid includes all drinks, coffee, juice, ice chips, soup, jello, and all other liquids.  Heart Failure, Diagnosis  Heart failure is a condition in which the heart has trouble pumping blood because it has become weak or stiff. This means that the heart does  not pump blood well enough for the body to stay healthy. For some people with heart failure, fluid may back up into the lungs. There may also be swelling (edema) in the lower legs. Heart failure is usually a long-term (chronic) condition. It is important for you to take good care of yourself and follow the treatment plan from your health care provider. What are the causes? This condition may be caused by:  High blood pressure (hypertension). Hypertension causes the heart muscle to work harder than normal. This makes the heart stiff or weak.  Coronary artery disease, or CAD. CAD is the buildup of cholesterol and fat (plaque) in the arteries of the heart.  Heart attack, also called myocardial infarction. This injures the heart muscle, making it hard for the heart to pump blood.  Abnormal heart valves. The valves do not open and close properly, forcing the heart to pump harder to keep the blood flowing.  Heart muscle disease (cardiomyopathy or myocarditis). This is damage to the heart muscle. It can increase the risk of heart failure.  Lung disease. The heart works harder when the lungs are not healthy.  Abnormal heart rhythms. These can lead to heart failure. What increases the risk? The risk of heart failure increases as a person ages. This condition is also more likely to develop in people who:  Are overweight.  Are male.  Smoke or chew tobacco.  Abuse alcohol or illegal drugs.  Have taken medicines that can damage the heart, such as chemotherapy drugs.  Have diabetes.  Have abnormal heart rhythms.  Have thyroid problems.  Have low blood counts (anemia). What are the signs or symptoms? Symptoms of this condition include:  Shortness of breath with activity, such as when climbing stairs.  A cough that does not go away.  Swelling of the feet, ankles, legs, or abdomen.  Losing weight for no reason.  Trouble breathing when lying flat (orthopnea).  Waking from sleep  because of the need to sit up and get more air.  Rapid heartbeat.  Tiredness (fatigue) and loss of energy.  Feeling light-headed, dizzy, or close to fainting.  Loss of appetite.  Nausea.  Waking up more often during  the night to urinate (nocturia).  Confusion. How is this diagnosed? This condition is diagnosed based on:  Your medical history, symptoms, and a physical exam.  Diagnostic tests, which may include: ? Echocardiogram. ? Electrocardiogram (ECG). ? Chest X-ray. ? Blood tests. ? Exercise stress test. ? Radionuclide scans. ? Cardiac catheterization and angiogram. How is this treated? Treatment for this condition is aimed at managing the symptoms of heart failure. Medicines Treatment may include medicines that:  Help lower blood pressure by relaxing (dilating) the blood vessels. These medicines are called ACE inhibitors (angiotensin-converting enzyme) and ARBs (angiotensin receptor blockers).  Cause the kidneys to remove salt and water from the blood through urination (diuretics).  Improve heart muscle strength and prevent the heart from beating too fast (beta blockers).  Increase the force of the heartbeat (digoxin). Healthy behavior changes     Treatment may also include making healthy lifestyle changes, such as:  Reaching and staying at a healthy weight.  Quitting smoking or chewing tobacco.  Eating heart-healthy foods.  Limiting or avoiding alcohol.  Stopping the use of illegal drugs.  Being physically active.  Other treatments Other treatments may include:  Procedures to open blocked arteries or repair damaged valves.  Placing a pacemaker to improve heart function (cardiac resynchronization therapy).  Placing a device to treat serious abnormal heart rhythms (implantable cardioverter defibrillator, or ICD).  Placing a device to improve the pumping ability of the heart (left ventricular assist device, or LVAD).  Receiving a healthy heart  from a donor (heart transplant). This is done when other treatments have not helped. Follow these instructions at home:  Manage other health conditions as told by your health care provider. These may include hypertension, diabetes, thyroid disease, or abnormal heart rhythms.  Get ongoing education and support as needed. Learn as much as you can about heart failure.  Keep all follow-up visits as told by your health care provider. This is important. Summary  Heart failure is a condition in which the heart has trouble pumping blood because it has become weak or stiff.  This condition is caused by high blood pressure and other diseases of the heart and lungs.  Symptoms of this condition include shortness of breath, tiredness (fatigue), nausea, and swelling of the feet, ankles, legs, or abdomen.  Treatments for this condition may include medicines, lifestyle changes, and surgery.  Manage other health conditions as told by your health care provider. This information is not intended to replace advice given to you by your health care provider. Make sure you discuss any questions you have with your health care provider. Document Revised: 07/14/2018 Document Reviewed: 07/14/2018 Elsevier Patient Education  2020 Elsevier Inc.  Atrial Fibrillation  Atrial fibrillation is a type of irregular or rapid heartbeat (arrhythmia). In atrial fibrillation, the top part of the heart (atria) beats in an irregular pattern. This makes the heart unable to pump blood normally and effectively. The goal of treatment is to prevent blood clots from forming, control your heart rate, or restore your heartbeat to a normal rhythm. If this condition is not treated, it can cause serious problems, such as a weakened heart muscle (cardiomyopathy) or a stroke. What are the causes? This condition is often caused by medical conditions that damage the heart's electrical system. These include:  High blood pressure (hypertension).  This is the most common cause.  Certain heart problems or conditions, such as heart failure, coronary artery disease, heart valve problems, or heart surgery.  Diabetes.  Overactive thyroid (hyperthyroidism).  Obesity.  Chronic kidney disease. In some cases, the cause of this condition is not known. What increases the risk? This condition is more likely to develop in:  Older people.  People who smoke.  Athletes who do endurance exercise.  People who have a family history of atrial fibrillation.  Men.  People who use drugs.  People who drink a lot of alcohol.  People who have lung conditions, such as emphysema, pneumonia, or COPD.  People who have obstructive sleep apnea. What are the signs or symptoms? Symptoms of this condition include:  A feeling that your heart is racing or beating irregularly.  Discomfort or pain in your chest.  Shortness of breath.  Sudden light-headedness or weakness.  Tiring easily during exercise or activity.  Fatigue.  Syncope (fainting).  Sweating. In some cases, there are no symptoms. How is this diagnosed? Your health care provider may detect atrial fibrillation when taking your pulse. If detected, this condition may be diagnosed with:  An electrocardiogram (ECG) to check electrical signals of the heart.  An ambulatory cardiac monitor to record your heart's activity for a few days.  A transthoracic echocardiogram (TTE) to create pictures of your heart.  A transesophageal echocardiogram (TEE) to create even closer pictures of your heart.  A stress test to check your blood supply while you exercise.  Imaging tests, such as a CT scan or chest X-ray.  Blood tests. How is this treated? Treatment depends on underlying conditions and how you feel when you experience atrial fibrillation. This condition may be treated with:  Medicines to prevent blood clots or to treat heart rate or heart rhythm problems.  Electrical  cardioversion to reset the heart's rhythm.  A pacemaker to correct abnormal heart rhythm.  Ablation to remove the heart tissue that sends abnormal signals.  Left atrial appendage closure to seal the area where blood clots can form. In some cases, underlying conditions will be treated. Follow these instructions at home: Medicines  Take over-the counter and prescription medicines only as told by your health care provider.  Do not take any new medicines without talking to your health care provider.  If you are taking blood thinners: ? Talk with your health care provider before you take any medicines that contain aspirin or NSAIDs, such as ibuprofen. These medicines increase your risk for dangerous bleeding. ? Take your medicine exactly as told, at the same time every day. ? Avoid activities that could cause injury or bruising, and follow instructions about how to prevent falls. ? Wear a medical alert bracelet or carry a card that lists what medicines you take. Lifestyle      Do not use any products that contain nicotine or tobacco, such as cigarettes, e-cigarettes, and chewing tobacco. If you need help quitting, ask your health care provider.  Eat heart-healthy foods. Talk with a dietitian to make an eating plan that is right for you.  Exercise regularly as told by your health care provider.  Do not drink alcohol.  Lose weight if you are overweight.  Do not use drugs, including cannabis. General instructions  If you have obstructive sleep apnea, manage your condition as told by your health care provider.  Do not use diet pills unless your health care provider approves. Diet pills can make heart problems worse.  Keep all follow-up visits as told by your health care provider. This is important. Contact a health care provider if you:  Notice a change in the rate, rhythm, or strength of your  heartbeat.  Are taking a blood thinner and you notice more bruising.  Tire more  easily when you exercise or do heavy work.  Have a sudden change in weight. Get help right away if you have:   Chest pain, abdominal pain, sweating, or weakness.  Trouble breathing.  Side effects of blood thinners, such as blood in your vomit, stool, or urine, or bleeding that cannot stop.  Any symptoms of a stroke. "BE FAST" is an easy way to remember the main warning signs of a stroke: ? B - Balance. Signs are dizziness, sudden trouble walking, or loss of balance. ? E - Eyes. Signs are trouble seeing or a sudden change in vision. ? F - Face. Signs are sudden weakness or numbness of the face, or the face or eyelid drooping on one side. ? A - Arms. Signs are weakness or numbness in an arm. This happens suddenly and usually on one side of the body. ? S - Speech. Signs are sudden trouble speaking, slurred speech, or trouble understanding what people say. ? T - Time. Time to call emergency services. Write down what time symptoms started.  Other signs of a stroke, such as: ? A sudden, severe headache with no known cause. ? Nausea or vomiting. ? Seizure. These symptoms may represent a serious problem that is an emergency. Do not wait to see if the symptoms will go away. Get medical help right away. Call your local emergency services (911 in the [place] Do not drive yourself to the hospital. Summary  Atrial fibrillation is a type of irregular or rapid heartbeat (arrhythmia).  Symptoms include a feeling that your heart is beating fast or irregularly.  You may be given medicines to prevent blood clots or to treat heart rate or heart rhythm problems.  Get help right away if you have signs or symptoms of a stroke.  Get help right away if you cannot catch your breath or have chest pain or pressure. This information is not intended to replace advice given to you by your health care provider. Make sure you discuss any questions you have with your health care provider. Document Revised:  10/18/2018 Document Reviewed: 10/18/2018 Elsevier Patient Education  2020 [employer] Inc. Apixaban oral tablets What is this medicine? APIXABAN (a PIX a ban) is an anticoagulant (blood thinner). It is used to lower the chance of stroke in people with a medical condition called atrial fibrillation. It is also used to treat or prevent blood clots in the lungs or in the veins. This medicine may be used for other purposes; ask your health care provider or pharmacist if you have questions. COMMON BRAND NAME(S): Eliquis What should I tell my health care provider before I take this medicine? They need to know if you have any of these conditions:  antiphospholipid antibody syndrome  bleeding disorders  bleeding in the brain  blood in your stools (black or tarry stools) or if you have blood in your vomit  history of blood clots  history of stomach bleeding  kidney disease  liver disease  mechanical heart valve  an unusual or allergic reaction to apixaban, other medicines, foods, dyes, or preservatives  pregnant or trying to get pregnant  breast-feeding How should I use this medicine? Take this medicine by mouth with a glass of water. Follow the directions on the prescription label. You can take it with or without food. If it upsets your stomach, take it with food. Take your medicine at regular intervals. Do not  take it more often than directed. Do not stop taking except on your doctor's advice. Stopping this medicine may increase your risk of a blood clot. Be sure to refill your prescription before you run out of medicine. Talk to your pediatrician regarding the use of this medicine in children. Special care may be needed. Overdosage: If you think you have taken too much of this medicine contact a poison control center or emergency room at once. NOTE: This medicine is only for you. Do not share this medicine with others. What if I miss a dose? If you miss a dose, take it as soon as you  can. If it is almost time for your next dose, take only that dose. Do not take double or extra doses. What may interact with this medicine? This medicine may interact with the following:  aspirin and aspirin-like medicines  certain medicines for fungal infections like ketoconazole and itraconazole  certain medicines for seizures like carbamazepine and phenytoin  certain medicines that treat or prevent blood clots like warfarin, enoxaparin, and dalteparin  clarithromycin  NSAIDs, medicines for pain and inflammation, like ibuprofen or naproxen  rifampin  ritonavir  St. John's wort This list may not describe all possible interactions. Give your health care provider a list of all the medicines, herbs, non-prescription drugs, or dietary supplements you use. Also tell them if you smoke, drink alcohol, or use illegal drugs. Some items may interact with your medicine. What should I watch for while using this medicine? Visit your healthcare professional for regular checks on your progress. You may need blood work done while you are taking this medicine. Your condition will be monitored carefully while you are receiving this medicine. It is important not to miss any appointments. Avoid sports and activities that might cause injury while you are using this medicine. Severe falls or injuries can cause unseen bleeding. Be careful when using sharp tools or knives. Consider using an Neurosurgeon. Take special care brushing or flossing your teeth. Report any injuries, bruising, or red spots on the skin to your healthcare professional. If you are going to need surgery or other procedure, tell your healthcare professional that you are taking this medicine. Wear a medical ID bracelet or chain. Carry a card that describes your disease and details of your medicine and dosage times. What side effects may I notice from receiving this medicine? Side effects that you should report to your doctor or health care  professional as soon as possible:  allergic reactions like skin rash, itching or hives, swelling of the face, lips, or tongue  signs and symptoms of bleeding such as bloody or black, tarry stools; red or dark-brown urine; spitting up blood or brown material that looks like coffee grounds; red spots on the skin; unusual bruising or bleeding from the eye, gums, or nose  signs and symptoms of a blood clot such as chest pain; shortness of breath; pain, swelling, or warmth in the leg  signs and symptoms of a stroke such as changes in vision; confusion; trouble speaking or understanding; severe headaches; sudden numbness or weakness of the face, arm or leg; trouble walking; dizziness; loss of coordination This list may not describe all possible side effects. Call your doctor for medical advice about side effects. You may report side effects to FDA at 1-800-FDA-1088. Where should I keep my medicine? Keep out of the reach of children. Store at room temperature between 20 and 25 degrees C (68 and 77 degrees F). Throw away any  unused medicine after the expiration date. NOTE: This sheet is a summary. It may not cover all possible information. If you have questions about this medicine, talk to your doctor, pharmacist, or health care provider.  2020 [employer] (2018-01-04 17:39:34)    Signed, Michael Red, MD PhD 09/13/2019 5:59 PM    [facility]

## 2019-09-13 NOTE — Patient Instructions (Addendum)
Medication Instructions:  Stop taking Metoprolol tartrate 50 mg twice a day Start taking Metoprolol Succinate 100 mg daily Decrease Lasix to 20 mg daily  *If you need a refill on your cardiac medications before your next appointment, please call your pharmacy*   Lab Work: Your physician recommends that you return for lab work today (CBC, BMP)  If you have labs (blood work) drawn today and your tests are completely normal, you will receive your results only by: Marland Kitchen MyChart Message (if you have MyChart) OR . A paper copy in the mail If you have any lab test that is abnormal or we need to change your treatment, we will call you to review the results.   Testing/Procedures: None   Follow-Up: At [facility], you and your health needs are our priority.  As part of our continuing mission to provide you with exceptional heart care, we have created designated Provider Care Teams.  These Care Teams include your primary Cardiologist (physician) and Advanced Practice Providers (APPs -  Physician Assistants and Nurse Practitioners) who all work together to provide you with the care you need, when you need it.  We recommend signing up for the patient portal called "MyChart".  Sign up information is provided on this After Visit Summary.  MyChart is used to connect with patients for Virtual Visits (Telemedicine).  Patients are able to view lab/test results, encounter notes, upcoming appointments, etc.  Non-urgent messages can be sent to your provider as well.   To learn more about what you can do with MyChart, go to [email or web address].    Your next appointment:   2 week(s)  The format for your next appointment:   Virtual Visit   Provider:   Jodelle Red, MD    Do the following things EVERY DAY:  1) Weigh yourself EVERY morning after you go to the bathroom but before you eat or drink anything. Write this number down in a weight log/diary. If you gain 3 pounds overnight or 5  pounds in a week, call the office.  2) Take your medicines as prescribed. If you have concerns about your medications, please call us before you stop taking them.   3) Eat low salt foods--Limit salt (sodium) to 2000 mg per day. This will help prevent your body from holding onto fluid. Read food labels as many processed foods have a lot of sodium, especially canned goods and prepackaged meats. If you would like some assistance choosing low sodium foods, we would be happy to set you up with a nutritionist.  4) Stay as active as you can everyday. Staying active will give you more energy and make your muscles stronger. Start with 5 minutes at a time and work your way up to 30 minutes a day. Break up your activities--do some in the morning and some in the afternoon. Start with 3 days per week and work your way up to 5 days as you can.  If you have chest pain, feel short of breath, dizzy, or lightheaded, STOP. If you don't feel better after a short rest, call 911. If you do feel better, call the office to let us know you have symptoms with exercise.  5) Limit all fluids for the day to less than 2 liters. Fluid includes all drinks, coffee, juice, ice chips, soup, jello, and all other liquids.  Heart Failure, Diagnosis  Heart failure is a condition in which the heart has trouble pumping blood because it has become weak or stiff. This means that  the heart does not pump blood well enough for the body to stay healthy. For some people with heart failure, fluid may back up into the lungs. There may also be swelling (edema) in the lower legs. Heart failure is usually a long-term (chronic) condition. It is important for you to take good care of yourself and follow the treatment plan from your health care provider. What are the causes? This condition may be caused by:  High blood pressure (hypertension). Hypertension causes the heart muscle to work harder than normal. This makes the heart stiff or weak.  Coronary  artery disease, or CAD. CAD is the buildup of cholesterol and fat (plaque) in the arteries of the heart.  Heart attack, also called myocardial infarction. This injures the heart muscle, making it hard for the heart to pump blood.  Abnormal heart valves. The valves do not open and close properly, forcing the heart to pump harder to keep the blood flowing.  Heart muscle disease (cardiomyopathy or myocarditis). This is damage to the heart muscle. It can increase the risk of heart failure.  Lung disease. The heart works harder when the lungs are not healthy.  Abnormal heart rhythms. These can lead to heart failure. What increases the risk? The risk of heart failure increases as a person ages. This condition is also more likely to develop in people who:  Are overweight.  Are male.  Smoke or chew tobacco.  Abuse alcohol or illegal drugs.  Have taken medicines that can damage the heart, such as chemotherapy drugs.  Have diabetes.  Have abnormal heart rhythms.  Have thyroid problems.  Have low blood counts (anemia). What are the signs or symptoms? Symptoms of this condition include:  Shortness of breath with activity, such as when climbing stairs.  A cough that does not go away.  Swelling of the feet, ankles, legs, or abdomen.  Losing weight for no reason.  Trouble breathing when lying flat (orthopnea).  Waking from sleep because of the need to sit up and get more air.  Rapid heartbeat.  Tiredness (fatigue) and loss of energy.  Feeling light-headed, dizzy, or close to fainting.  Loss of appetite.  Nausea.  Waking up more often during the night to urinate (nocturia).  Confusion. How is this diagnosed? This condition is diagnosed based on:  Your medical history, symptoms, and a physical exam.  Diagnostic tests, which may include: ? Echocardiogram. ? Electrocardiogram (ECG). ? Chest X-ray. ? Blood tests. ? Exercise stress test. ? Radionuclide  scans. ? Cardiac catheterization and angiogram. How is this treated? Treatment for this condition is aimed at managing the symptoms of heart failure. Medicines Treatment may include medicines that:  Help lower blood pressure by relaxing (dilating) the blood vessels. These medicines are called ACE inhibitors (angiotensin-converting enzyme) and ARBs (angiotensin receptor blockers).  Cause the kidneys to remove salt and water from the blood through urination (diuretics).  Improve heart muscle strength and prevent the heart from beating too fast (beta blockers).  Increase the force of the heartbeat (digoxin). Healthy behavior changes     Treatment may also include making healthy lifestyle changes, such as:  Reaching and staying at a healthy weight.  Quitting smoking or chewing tobacco.  Eating heart-healthy foods.  Limiting or avoiding alcohol.  Stopping the use of illegal drugs.  Being physically active.  Other treatments Other treatments may include:  Procedures to open blocked arteries or repair damaged valves.  Placing a pacemaker to improve heart function (cardiac resynchronization therapy).  Placing a device to treat serious abnormal heart rhythms (implantable cardioverter defibrillator, or ICD).  Placing a device to improve the pumping ability of the heart (left ventricular assist device, or LVAD).  Receiving a healthy heart from a donor (heart transplant). This is done when other treatments have not helped. Follow these instructions at home:  Manage other health conditions as told by your health care provider. These may include hypertension, diabetes, thyroid disease, or abnormal heart rhythms.  Get ongoing education and support as needed. Learn as much as you can about heart failure.  Keep all follow-up visits as told by your health care provider. This is important. Summary  Heart failure is a condition in which the heart has trouble pumping blood because it  has become weak or stiff.  This condition is caused by high blood pressure and other diseases of the heart and lungs.  Symptoms of this condition include shortness of breath, tiredness (fatigue), nausea, and swelling of the feet, ankles, legs, or abdomen.  Treatments for this condition may include medicines, lifestyle changes, and surgery.  Manage other health conditions as told by your health care provider. This information is not intended to replace advice given to you by your health care provider. Make sure you discuss any questions you have with your health care provider. Document Revised: 07/14/2018 Document Reviewed: 07/14/2018 Elsevier Patient Education  [place].  Atrial Fibrillation  Atrial fibrillation is a type of irregular or rapid heartbeat (arrhythmia). In atrial fibrillation, the top part of the heart (atria) beats in an irregular pattern. This makes the heart unable to pump blood normally and effectively. The goal of treatment is to prevent blood clots from forming, control your heart rate, or restore your heartbeat to a normal rhythm. If this condition is not treated, it can cause serious problems, such as a weakened heart muscle (cardiomyopathy) or a stroke. What are the causes? This condition is often caused by medical conditions that damage the heart's electrical system. These include:  High blood pressure (hypertension). This is the most common cause.  Certain heart problems or conditions, such as heart failure, coronary artery disease, heart valve problems, or heart surgery.  Diabetes.  Overactive thyroid (hyperthyroidism).  Obesity.  Chronic kidney disease. In some cases, the cause of this condition is not known. What increases the risk? This condition is more likely to develop in:  Older people.  People who smoke.  Athletes who do endurance exercise.  People who have a family history of atrial fibrillation.  Men.  People who use  drugs.  People who drink a lot of alcohol.  People who have lung conditions, such as emphysema, pneumonia, or COPD.  People who have obstructive sleep apnea. What are the signs or symptoms? Symptoms of this condition include:  A feeling that your heart is racing or beating irregularly.  Discomfort or pain in your chest.  Shortness of breath.  Sudden light-headedness or weakness.  Tiring easily during exercise or activity.  Fatigue.  Syncope (fainting).  Sweating. In some cases, there are no symptoms. How is this diagnosed? Your health care provider may detect atrial fibrillation when taking your pulse. If detected, this condition may be diagnosed with:  An electrocardiogram (ECG) to check electrical signals of the heart.  An ambulatory cardiac monitor to record your heart's activity for a few days.  A transthoracic echocardiogram (TTE) to create pictures of your heart.  A transesophageal echocardiogram (TEE) to create even closer pictures of your heart.  A  stress test to check your blood supply while you exercise.  Imaging tests, such as a CT scan or chest X-ray.  Blood tests. How is this treated? Treatment depends on underlying conditions and how you feel when you experience atrial fibrillation. This condition may be treated with:  Medicines to prevent blood clots or to treat heart rate or heart rhythm problems.  Electrical cardioversion to reset the heart's rhythm.  A pacemaker to correct abnormal heart rhythm.  Ablation to remove the heart tissue that sends abnormal signals.  Left atrial appendage closure to seal the area where blood clots can form. In some cases, underlying conditions will be treated. Follow these instructions at home: Medicines  Take over-the counter and prescription medicines only as told by your health care provider.  Do not take any new medicines without talking to your health care provider.  If you are taking blood  thinners: ? Talk with your health care provider before you take any medicines that contain aspirin or NSAIDs, such as ibuprofen. These medicines increase your risk for dangerous bleeding. ? Take your medicine exactly as told, at the same time every day. ? Avoid activities that could cause injury or bruising, and follow instructions about how to prevent falls. ? Wear a medical alert bracelet or carry a card that lists what medicines you take. Lifestyle      Do not use any products that contain nicotine or tobacco, such as cigarettes, e-cigarettes, and chewing tobacco. If you need help quitting, ask your health care provider.  Eat heart-healthy foods. Talk with a dietitian to make an eating plan that is right for you.  Exercise regularly as told by your health care provider.  Do not drink alcohol.  Lose weight if you are overweight.  Do not use drugs, including cannabis. General instructions  If you have obstructive sleep apnea, manage your condition as told by your health care provider.  Do not use diet pills unless your health care provider approves. Diet pills can make heart problems worse.  Keep all follow-up visits as told by your health care provider. This is important. Contact a health care provider if you:  Notice a change in the rate, rhythm, or strength of your heartbeat.  Are taking a blood thinner and you notice more bruising.  Tire more easily when you exercise or do heavy work.  Have a sudden change in weight. Get help right away if you have:   Chest pain, abdominal pain, sweating, or weakness.  Trouble breathing.  Side effects of blood thinners, such as blood in your vomit, stool, or urine, or bleeding that cannot stop.  Any symptoms of a stroke. "BE FAST" is an easy way to remember the main warning signs of a stroke: ? B - Balance. Signs are dizziness, sudden trouble walking, or loss of balance. ? E - Eyes. Signs are trouble seeing or a sudden change in  vision. ? F - Face. Signs are sudden weakness or numbness of the face, or the face or eyelid drooping on one side. ? A - Arms. Signs are weakness or numbness in an arm. This happens suddenly and usually on one side of the body. ? S - Speech. Signs are sudden trouble speaking, slurred speech, or trouble understanding what people say. ? T - Time. Time to call emergency services. Write down what time symptoms started.  Other signs of a stroke, such as: ? A sudden, severe headache with no known cause. ? Nausea or vomiting. ? Seizure.  These symptoms may represent a serious problem that is an emergency. Do not wait to see if the symptoms will go away. Get medical help right away. Call your local emergency services (911 in the [place] Do not drive yourself to the hospital. Summary  Atrial fibrillation is a type of irregular or rapid heartbeat (arrhythmia).  Symptoms include a feeling that your heart is beating fast or irregularly.  You may be given medicines to prevent blood clots or to treat heart rate or heart rhythm problems.  Get help right away if you have signs or symptoms of a stroke.  Get help right away if you cannot catch your breath or have chest pain or pressure. This information is not intended to replace advice given to you by your health care provider. Make sure you discuss any questions you have with your health care provider. Document Revised: 10/18/2018 Document Reviewed: 10/18/2018 Elsevier Patient Education  2020 Elsevier Inc. Apixaban oral tablets What is this medicine? APIXABAN (a PIX a ban) is an anticoagulant (blood thinner). It is used to lower the chance of stroke in people with a medical condition called atrial fibrillation. It is also used to treat or prevent blood clots in the lungs or in the veins. This medicine may be used for other purposes; ask your health care provider or pharmacist if you have questions. COMMON BRAND NAME(S): Eliquis What should I tell my  health care provider before I take this medicine? They need to know if you have any of these conditions:  antiphospholipid antibody syndrome  bleeding disorders  bleeding in the brain  blood in your stools (black or tarry stools) or if you have blood in your vomit  history of blood clots  history of stomach bleeding  kidney disease  liver disease  mechanical heart valve  an unusual or allergic reaction to apixaban, other medicines, foods, dyes, or preservatives  pregnant or trying to get pregnant  breast-feeding How should I use this medicine? Take this medicine by mouth with a glass of water. Follow the directions on the prescription label. You can take it with or without food. If it upsets your stomach, take it with food. Take your medicine at regular intervals. Do not take it more often than directed. Do not stop taking except on your doctor's advice. Stopping this medicine may increase your risk of a blood clot. Be sure to refill your prescription before you run out of medicine. Talk to your pediatrician regarding the use of this medicine in children. Special care may be needed. Overdosage: If you think you have taken too much of this medicine contact a poison control center or emergency room at once. NOTE: This medicine is only for you. Do not share this medicine with others. What if I miss a dose? If you miss a dose, take it as soon as you can. If it is almost time for your next dose, take only that dose. Do not take double or extra doses. What may interact with this medicine? This medicine may interact with the following:  aspirin and aspirin-like medicines  certain medicines for fungal infections like ketoconazole and itraconazole  certain medicines for seizures like carbamazepine and phenytoin  certain medicines that treat or prevent blood clots like warfarin, enoxaparin, and dalteparin  clarithromycin  NSAIDs, medicines for pain and inflammation, like ibuprofen  or naproxen  rifampin  ritonavir  St. John's wort This list may not describe all possible interactions. Give your health care provider a list of all  the medicines, herbs, non-prescription drugs, or dietary supplements you use. Also tell them if you smoke, drink alcohol, or use illegal drugs. Some items may interact with your medicine. What should I watch for while using this medicine? Visit your healthcare professional for regular checks on your progress. You may need blood work done while you are taking this medicine. Your condition will be monitored carefully while you are receiving this medicine. It is important not to miss any appointments. Avoid sports and activities that might cause injury while you are using this medicine. Severe falls or injuries can cause unseen bleeding. Be careful when using sharp tools or knives. Consider using an Neurosurgeon. Take special care brushing or flossing your teeth. Report any injuries, bruising, or red spots on the skin to your healthcare professional. If you are going to need surgery or other procedure, tell your healthcare professional that you are taking this medicine. Wear a medical ID bracelet or chain. Carry a card that describes your disease and details of your medicine and dosage times. What side effects may I notice from receiving this medicine? Side effects that you should report to your doctor or health care professional as soon as possible:  allergic reactions like skin rash, itching or hives, swelling of the face, lips, or tongue  signs and symptoms of bleeding such as bloody or black, tarry stools; red or dark-brown urine; spitting up blood or brown material that looks like coffee grounds; red spots on the skin; unusual bruising or bleeding from the eye, gums, or nose  signs and symptoms of a blood clot such as chest pain; shortness of breath; pain, swelling, or warmth in the leg  signs and symptoms of a stroke such as changes in vision;  confusion; trouble speaking or understanding; severe headaches; sudden numbness or weakness of the face, arm or leg; trouble walking; dizziness; loss of coordination This list may not describe all possible side effects. Call your doctor for medical advice about side effects. You may report side effects to FDA at 1-800-FDA-1088. Where should I keep my medicine? Keep out of the reach of children. Store at room temperature between 20 and 25 degrees C (68 and 77 degrees F). Throw away any unused medicine after the expiration date. NOTE: This sheet is a summary. It may not cover all possible information. If you have questions about this medicine, talk to your doctor, pharmacist, or health care provider.  2020 Elsevier/Gold Standard (2018-01-04 17:39:34)

## 2019-09-14 LAB — CBC
Hematocrit: 43 % (ref 37.5–51.0)
Hemoglobin: 15 g/dL (ref 13.0–17.7)
MCH: 33.7 pg — ABNORMAL HIGH (ref 26.6–33.0)
MCHC: 34.9 g/dL (ref 31.5–35.7)
MCV: 97 fL (ref 79–97)
Platelets: 288 10*3/uL (ref 150–450)
RBC: 4.45 x10E6/uL (ref 4.14–5.80)
RDW: 12.8 % (ref 11.6–15.4)
WBC: 12.8 10*3/uL — ABNORMAL HIGH (ref 3.4–10.8)

## 2019-09-14 LAB — BASIC METABOLIC PANEL
BUN/Creatinine Ratio: 21 (ref 10–24)
BUN: 32 mg/dL — ABNORMAL HIGH (ref 8–27)
CO2: 26 mmol/L (ref 20–29)
Calcium: 9.6 mg/dL (ref 8.6–10.2)
Chloride: 102 mmol/L (ref 96–106)
Creatinine, Ser: 1.51 mg/dL — ABNORMAL HIGH (ref 0.76–1.27)
GFR calc Af Amer: 47 mL/min/{1.73_m2} — ABNORMAL LOW (ref >59)
GFR calc non Af Amer: 40 mL/min/{1.73_m2} — ABNORMAL LOW (ref >59)
Glucose: 52 mg/dL — ABNORMAL LOW (ref 65–99)
Potassium: 4.8 mmol/L (ref 3.5–5.2)
Sodium: 146 mmol/L — ABNORMAL HIGH (ref 134–144)

## 2019-09-19 DIAGNOSIS — N1832 Chronic kidney disease, stage 3b: Secondary | ICD-10-CM | POA: Insufficient documentation

## 2019-09-25 ENCOUNTER — Telehealth (INDEPENDENT_AMBULATORY_CARE_PROVIDER_SITE_OTHER): Payer: Medicare Other | Admitting: Cardiology

## 2019-09-25 ENCOUNTER — Encounter: Payer: Self-pay | Admitting: Cardiology

## 2019-09-25 VITALS — BP 143/74 | HR 81 | Ht 67.0 in | Wt 156.2 lb

## 2019-09-25 DIAGNOSIS — Z79899 Other long term (current) drug therapy: Secondary | ICD-10-CM

## 2019-09-25 DIAGNOSIS — I4819 Other persistent atrial fibrillation: Secondary | ICD-10-CM

## 2019-09-25 DIAGNOSIS — Z7189 Other specified counseling: Secondary | ICD-10-CM

## 2019-09-25 MED ORDER — APIXABAN 2.5 MG PO TABS: 2.5000 mg | ORAL_TABLET | Freq: Two times a day (BID) | ORAL | 11 refills | Status: DC

## 2019-09-25 NOTE — Patient Instructions (Signed)
Medication Instructions:  Stop taking Aspirin 81 mg  Start taking Eliquis 2.5 mg twice a day  *If you need a refill on your cardiac medications before your next appointment, please call your pharmacy*   Lab Work: None   Testing/Procedures: None   Follow-Up: At [facility], you and your health needs are our priority.  As part of our continuing mission to provide you with exceptional heart care, we have created designated Provider Care Teams.  These Care Teams include your primary Cardiologist (physician) and Advanced Practice Providers (APPs -  Physician Assistants and Nurse Practitioners) who all work together to provide you with the care you need, when you need it.  We recommend signing up for the patient portal called "MyChart".  Sign up information is provided on this After Visit Summary.  MyChart is used to connect with patients for Virtual Visits (Telemedicine).  Patients are able to view lab/test results, encounter notes, upcoming appointments, etc.  Non-urgent messages can be sent to your provider as well.   To learn more about what you can do with MyChart, go to [email or web address].    Your next appointment:   1 month  The format for your next appointment:   In office  Provider:   Dr. Cristal Deer

## 2019-09-25 NOTE — Progress Notes (Signed)
Virtual Visit via Telephone Note   This visit type was conducted due to national recommendations for restrictions regarding the COVID-19 Pandemic (e.g. social distancing) in an effort to limit this patient's exposure and mitigate transmission in our community.  Due to his co-morbid illnesses, this patient is at least at moderate risk for complications without adequate follow up.  This format is felt to be most appropriate for this patient at this time.  The patient did not have access to video technology/had technical difficulties with video requiring transitioning to audio format only (telephone).  All issues noted in this document were discussed and addressed.  No physical exam could be performed with this format.  Please refer to the patient's chart for his  consent to telehealth for [facility].   The patient was identified using 2 identifiers.  Date:  09/25/2019   ID:  Michael Haas, DOB November 25, 1928, MRN [id number]  Patient Location: Home Provider Location: Home  PCP:  Christain Sacramento, MD  Cardiologist:  Buford Dresser, MD  Electrophysiologist:  None   Evaluation Performed:  Follow-Up Visit  Chief Complaint:  followup  History of Present Illness:    Michael Haas is a 86 y.o. male with a hx of hypertension, hyperlipidemia, chronic venous stasis ulcers  The patient does not have symptoms concerning for COVID-19 infection (fever, chills, cough, or new shortness of breath).   Today: We reviewed recent PCP visit with Dr. Redmond Pulling. Per the note, he did not think anticoagulation was indicated.  No falls since leaving the hospital, but did have falls prior to this. We spent extensive time discussing the risk of bleeding with a fall vs. Risk of stroke. Discussed that aspirin does not prevent strokes in afib. Wife has had strokes in the past, and they have discussed their preference. They would like to try low dose apixaban (based on age and Cr) for a month. Will stop aspirin as  well. Counseled on red flag signs  No LE edema. Reviewed last kidney function was stable. Ulcer on his leg healing well.   Denies chest pain, shortness of breath at rest or with normal exertion. No PND, orthopnea, LE edema or unexpected weight gain. No syncope or palpitations.   Past Medical History:  Diagnosis Date  . Hyperlipidemia   . Hypertension    Past Surgical History:  Procedure Laterality Date  . HERNIA REPAIR    . left hand surgery     "It was caught in a machine years ago"     Current Meds  Medication Sig  . aspirin 81 MG chewable tablet Chew 81 mg by mouth daily.  Marland Kitchen atorvastatin (LIPITOR) 40 MG tablet Take 1 tablet (40 mg total) by mouth daily.  . furosemide (LASIX) 20 MG tablet Take 1 tablet (20 mg total) by mouth daily.  . metoprolol succinate (TOPROL-XL) 100 MG 24 hr tablet Take 1 tablet (100 mg total) by mouth daily. Take with or immediately following a meal.     Allergies:   Patient has no known allergies.   Social History   Tobacco Use  . Smoking status: Never Smoker  . Smokeless tobacco: Former Systems developer    Types: Chew  Substance Use Topics  . Alcohol use: Not Currently    Comment: last time 20 years ago  . Drug use: Never     Family Hx: No family history of heart issues that he is aware of. All lived to be in their 23s.  ROS:   Please see  the history of present illness.    All other systems reviewed and are negative.   Prior CV studies:   The following studies were reviewed today: Echo 08/17/19 reviewed  Labs/Other Tests and Data Reviewed:    EKG:  An ECG dated 09/13/19 was personally reviewed today and demonstrated:  atrial fibrillation, RBBB, PVCs vs. aberrant conduction  Recent Labs: 08/16/2019: B Natriuretic Peptide 1,896.9; Magnesium 2.0 08/21/2019: ALT 25 09/13/2019: BUN 32; Creatinine, Ser 1.51; Hemoglobin 15.0; Platelets 288; Potassium 4.8; Sodium 146   Recent Lipid Panel No results found for: CHOL, TRIG, HDL, CHOLHDL, LDLCALC,  LDLDIRECT  Wt Readings from Last 3 Encounters:  09/25/19 156 lb 3.2 oz (70.9 kg)  09/13/19 160 lb 6.4 oz (72.8 kg)  08/21/19 158 lb 15.2 oz (72.1 kg)     Objective:    Vital Signs:  BP (!) 143/74   Pulse 81   Ht 5\' 7"  (1.702 m)   Wt 156 lb 3.2 oz (70.9 kg)   BMI 24.46 kg/m    Speaking comfortably on the phone, no audible wheezing In no acute distress Alert and oriented Normal affect Normal speech  ASSESSMENT & PLAN:    Atrial fibrillation: likely persistent -CHA2DS2/VAS Stroke Risk Points=4 -we have had extensive risk/benefit discussion of anticoagulation. They would like to avoid stroke as a priority -will start apixaban 2.5 mg BID today -stop aspirin -counseled on monitoring for bleeding -continue metoprolol succinate 100 mg daily for rate control -discuss rhythm control if he tolerates anticoagulation  Chronic systolic and diastolic heart failure:  -edema now resolved. -reiterated heart failure education -continue furosemide 20 mg daily -continue metoprolol succinate -no BP room for ACEi/ARB/ARNI -recheck echo in the future  Cardiac risk counseling and prevention recommendations: -recommend heart healthy/Mediterranean diet, with whole grains, fruits, vegetable, fish, lean meats, nuts, and olive oil. Limit salt. -recommend moderate walking, 3-5 times/week for 30-50 minutes each session. Aim for at least 150 minutes.week. Goal should be pace of 3 miles/hours, or walking 1.5 miles in 30 minutes -recommend avoidance of tobacco products. Avoid excess alcohol.  COVID-19 Education: The signs and symptoms of COVID-19 were discussed with the patient and how to seek care for testing (follow up with PCP or arrange E-visit).  The importance of social distancing was discussed today.  Time:   Today, I have spent 27 minutes with the patient with telehealth technology discussing the above problems.     Medication Adjustments/Labs and Tests Ordered: Current medicines are  reviewed at length with the patient today.  Concerns regarding medicines are outlined above.   Patient Instructions  Medication Instructions:  Stop taking Aspirin 81 mg  Start taking Eliquis 2.5 mg twice a day  *If you need a refill on your cardiac medications before your next appointment, please call your pharmacy*   Lab Work: None   Testing/Procedures: None   Follow-Up: At , you and your health needs are our priority.  As part of our continuing mission to provide you with exceptional heart care, we have created designated Provider Care Teams.  These Care Teams include your primary Cardiologist (physician) and Advanced Practice Providers (APPs -  Physician Assistants and Nurse Practitioners) who all work together to provide you with the care you need, when you need it.  We recommend signing up for the patient portal called "MyChart".  Sign up information is provided on this After Visit Summary.  MyChart is used to connect with patients for Virtual Visits (Telemedicine).  Patients are able to view lab/test results,  encounter notes, upcoming appointments, etc.  Non-urgent messages can be sent to your provider as well.   To learn more about what you can do with MyChart, go to [email or web address].    Your next appointment:   1 month  The format for your next appointment:   In office  Provider:   Dr. Cristal Deer       Signed, Jodelle Red, MD  09/25/2019  [facility]

## 2019-11-14 ENCOUNTER — Telehealth (INDEPENDENT_AMBULATORY_CARE_PROVIDER_SITE_OTHER): Payer: Medicare Other | Admitting: Cardiology

## 2019-11-14 ENCOUNTER — Encounter: Payer: Self-pay | Admitting: Cardiology

## 2019-11-14 VITALS — BP 134/85 | HR 90 | Temp 96.1°F | Ht 68.0 in | Wt 157.2 lb

## 2019-11-14 DIAGNOSIS — R600 Localized edema: Secondary | ICD-10-CM

## 2019-11-14 DIAGNOSIS — Z7189 Other specified counseling: Secondary | ICD-10-CM | POA: Diagnosis not present

## 2019-11-14 DIAGNOSIS — I4819 Other persistent atrial fibrillation: Secondary | ICD-10-CM | POA: Diagnosis not present

## 2019-11-14 DIAGNOSIS — I5042 Chronic combined systolic (congestive) and diastolic (congestive) heart failure: Secondary | ICD-10-CM | POA: Diagnosis not present

## 2019-11-14 NOTE — Patient Instructions (Signed)
Medication Instructions:  Your Physician recommend you continue on your current medication as directed.    *If you need a refill on your cardiac medications before your next appointment, please call your pharmacy*   Lab Work: None   Testing/Procedures: None  Follow-Up: At [facility], you and your health needs are our priority.  As part of our continuing mission to provide you with exceptional heart care, we have created designated Provider Care Teams.  These Care Teams include your primary Cardiologist (physician) and Advanced Practice Providers (APPs -  Physician Assistants and Nurse Practitioners) who all work together to provide you with the care you need, when you need it.  We recommend signing up for the patient portal called "MyChart".  Sign up information is provided on this After Visit Summary.  MyChart is used to connect with patients for Virtual Visits (Telemedicine).  Patients are able to view lab/test results, encounter notes, upcoming appointments, etc.  Non-urgent messages can be sent to your provider as well.   To learn more about what you can do with MyChart, go to [email or web address].    Your next appointment:   1 month  The format for your next appointment:   In Person  Provider:   Jodelle Red, MD

## 2019-11-14 NOTE — Progress Notes (Signed)
Virtual Visit via Telephone Note   This visit type was conducted due to national recommendations for restrictions regarding the COVID-19 Pandemic (e.g. social distancing) in an effort to limit this patient's exposure and mitigate transmission in our community.  Due to his co-morbid illnesses, this patient is at least at moderate risk for complications without adequate follow up.  This format is felt to be most appropriate for this patient at this time.  The patient did not have access to video technology/had technical difficulties with video requiring transitioning to audio format only (telephone).  All issues noted in this document were discussed and addressed.  No physical exam could be performed with this format.  Please refer to the patient's chart for his  consent to telehealth for [facility].   The patient was identified using 2 identifiers.  Date:  11/14/2019   ID:  Michael Haas, DOB 1929-01-29, MRN [id number]  Patient Location: Home Provider Location: Home  PCP:  Barbie Banner, MD  Cardiologist:  Jodelle Red, MD  Electrophysiologist:  None   Evaluation Performed:  Follow-Up Visit  Chief Complaint:  followup  History of Present Illness:    Michael Haas is a 86 y.o. male with a hx of hypertension, hyperlipidemia, chronic venous stasis ulcers  The patient does not have symptoms concerning for COVID-19 infection (fever, chills, cough, or new shortness of breath).   Today: Makes good urine on the furosemide 20 mg daily. Keeping fluid down. Legs are looking much improved. Weight is stable. He denies shortness of breath, but wife endorses that he gets fatigued easily and does complain about shortness of breath.   Doing well on the blood thinner. No severe bleeding, no melena or hematochezia. No hematuria. No falls.  Heart rates have been highly variable. Range 90-110s. Discussed cardioversion today. He wishes to consider for a while.  Denies chest pain, shortness  of breath at rest or with normal exertion. No PND, orthopnea, or unexpected weight gain. No syncope or palpitations.  Past Medical History:  Diagnosis Date  . Hyperlipidemia   . Hypertension    Past Surgical History:  Procedure Laterality Date  . HERNIA REPAIR    . left hand surgery     "It was caught in a machine years ago"     Current Meds  Medication Sig  . apixaban (ELIQUIS) 2.5 MG TABS tablet Take 1 tablet (2.5 mg total) by mouth 2 (two) times daily.  Marland Kitchen atorvastatin (LIPITOR) 40 MG tablet Take 1 tablet (40 mg total) by mouth daily.  . furosemide (LASIX) 20 MG tablet Take 1 tablet (20 mg total) by mouth daily.  . metoprolol succinate (TOPROL-XL) 100 MG 24 hr tablet Take 1 tablet (100 mg total) by mouth daily. Take with or immediately following a meal.     Allergies:   Patient has no known allergies.   Social History   Tobacco Use  . Smoking status: Never Smoker  . Smokeless tobacco: Former Neurosurgeon    Types: Engineer, drilling  . Vaping Use: Never used  Substance Use Topics  . Alcohol use: Not Currently    Comment: last time 20 years ago  . Drug use: Never     Family Hx: No family history of heart issues that he is aware of. All lived to be in their 90s.  ROS:   Please see the history of present illness.    All other systems reviewed and are negative.   Prior CV studies:  The following studies were reviewed today: Echo 08/17/19 reviewed  Labs/Other Tests and Data Reviewed:    EKG:  An ECG dated 09/13/19 was personally reviewed today and demonstrated:  atrial fibrillation, RBBB, PVCs vs. aberrant conduction  Recent Labs: 08/16/2019: B Natriuretic Peptide 1,896.9; Magnesium 2.0 08/21/2019: ALT 25 09/13/2019: BUN 32; Creatinine, Ser 1.51; Hemoglobin 15.0; Platelets 288; Potassium 4.8; Sodium 146   Recent Lipid Panel No results found for: CHOL, TRIG, HDL, CHOLHDL, LDLCALC, LDLDIRECT  Wt Readings from Last 3 Encounters:  11/14/19 157 lb 3.2 oz (71.3 kg)  09/25/19 156  lb 3.2 oz (70.9 kg)  09/13/19 160 lb 6.4 oz (72.8 kg)     Objective:    Vital Signs:  BP 134/85   Pulse 90   Temp (!) 96.1 F (35.6 C)   Ht 5\' 8"  (1.727 m)   Wt 157 lb 3.2 oz (71.3 kg)   SpO2 98%   BMI 23.90 kg/m    Speaking comfortably on the phone, no audible wheezing In no acute distress Alert and oriented Normal affect Normal speech  ASSESSMENT & PLAN:    Atrial fibrillation: likely persistent -CHA2DS2/VAS Stroke Risk Points=4 -tolerating apixaban 2.5 mg BID -counseled on monitoring for bleeding -continue metoprolol succinate 100 mg daily for rate control -we discussed cardioversion today. He would like to think about it. Will arrange for close follow up  Chronic systolic and diastolic heart failure:  -edema improved -reiterated heart failure education -continue furosemide 20 mg daily -continue metoprolol succinate -if BP allows at followup, consider ACEi/ARB/ARNI -recheck echo in the future, ideally in sinus rhythm  Cardiac risk counseling and prevention recommendations: -recommend heart healthy/Mediterranean diet, with whole grains, fruits, vegetable, fish, lean meats, nuts, and olive oil. Limit salt. -recommend moderate walking, 3-5 times/week for 30-50 minutes each session. Aim for at least 150 minutes.week. Goal should be pace of 3 miles/hours, or walking 1.5 miles in 30 minutes -recommend avoidance of tobacco products. Avoid excess alcohol.  COVID-19 Education: The signs and symptoms of COVID-19 were discussed with the patient and how to seek care for testing (follow up with PCP or arrange E-visit).  The importance of social distancing was discussed today.  Time:   Today, I have spent 12 minutes with the patient with telehealth technology discussing the above problems.    Follow up: In office 1 mos, discuss cardioversion  Medication Adjustments/Labs and Tests Ordered: Current medicines are reviewed at length with the patient today.  Concerns regarding  medicines are outlined above.   Patient Instructions  Medication Instructions:  Your Physician recommend you continue on your current medication as directed.    *If you need a refill on your cardiac medications before your next appointment, please call your pharmacy*   Lab Work: None   Testing/Procedures: None  Follow-Up: At [facility], you and your health needs are our priority.  As part of our continuing mission to provide you with exceptional heart care, we have created designated Provider Care Teams.  These Care Teams include your primary Cardiologist (physician) and Advanced Practice Providers (APPs -  Physician Assistants and Nurse Practitioners) who all work together to provide you with the care you need, when you need it.  We recommend signing up for the patient portal called "MyChart".  Sign up information is provided on this After Visit Summary.  MyChart is used to connect with patients for Virtual Visits (Telemedicine).  Patients are able to view lab/test results, encounter notes, upcoming appointments, etc.  Non-urgent messages can be sent  to your provider as well.   To learn more about what you can do with MyChart, go to [email or web address].    Your next appointment:   1 month  The format for your next appointment:   In Person  Provider:   Jodelle Red, MD       Signed, Jodelle Red, MD  11/14/2019  [facility]

## 2019-12-26 ENCOUNTER — Other Ambulatory Visit: Payer: Self-pay

## 2019-12-26 ENCOUNTER — Ambulatory Visit (INDEPENDENT_AMBULATORY_CARE_PROVIDER_SITE_OTHER): Payer: Medicare Other | Admitting: Cardiology

## 2019-12-26 ENCOUNTER — Encounter: Payer: Self-pay | Admitting: Cardiology

## 2019-12-26 VITALS — BP 130/76 | HR 102 | Ht 67.0 in | Wt 159.6 lb

## 2019-12-26 DIAGNOSIS — I4811 Longstanding persistent atrial fibrillation: Secondary | ICD-10-CM | POA: Diagnosis not present

## 2019-12-26 DIAGNOSIS — Z7901 Long term (current) use of anticoagulants: Secondary | ICD-10-CM

## 2019-12-26 DIAGNOSIS — Z7189 Other specified counseling: Secondary | ICD-10-CM | POA: Diagnosis not present

## 2019-12-26 DIAGNOSIS — I5042 Chronic combined systolic (congestive) and diastolic (congestive) heart failure: Secondary | ICD-10-CM

## 2019-12-26 DIAGNOSIS — I4819 Other persistent atrial fibrillation: Secondary | ICD-10-CM

## 2019-12-26 NOTE — Patient Instructions (Addendum)
Medication Instructions:  Your Physician recommend you continue on your current medication as directed.    *If you need a refill on your cardiac medications before your next appointment, please call your pharmacy*   Lab Work: None   Testing/Procedures: None   Follow-Up: At [facility], you and your health needs are our priority.  As part of our continuing mission to provide you with exceptional heart care, we have created designated Provider Care Teams.  These Care Teams include your primary Cardiologist (physician) and Advanced Practice Providers (APPs -  Physician Assistants and Nurse Practitioners) who all work together to provide you with the care you need, when you need it.  We recommend signing up for the patient portal called "MyChart".  Sign up information is provided on this After Visit Summary.  MyChart is used to connect with patients for Virtual Visits (Telemedicine).  Patients are able to view lab/test results, encounter notes, upcoming appointments, etc.  Non-urgent messages can be sent to your provider as well.   To learn more about what you can do with MyChart, go to [email or web address].    Your next appointment:   3 month(s)  The format for your next appointment:   In Person  Provider:   Jodelle Red, MD     Electrical Cardioversion Electrical cardioversion is the delivery of a jolt of electricity to restore a normal rhythm to the heart. A rhythm that is too fast or is not regular keeps the heart from pumping well. In this procedure, sticky patches or metal paddles are placed on the chest to deliver electricity to the heart from a device. This procedure may be done in an emergency if:  There is low or no blood pressure as a result of the heart rhythm.  Normal rhythm must be restored as fast as possible to protect the brain and heart from further damage.  It may save a life. This may also be a scheduled procedure for irregular or fast heart  rhythms that are not immediately life-threatening. Tell a health care provider about:  Any allergies you have.  All medicines you are taking, including vitamins, herbs, eye drops, creams, and over-the-counter medicines.  Any problems you or family members have had with anesthetic medicines.  Any blood disorders you have.  Any surgeries you have had.  Any medical conditions you have.  Whether you are pregnant or may be pregnant. What are the risks? Generally, this is a safe procedure. However, problems may occur, including:  Allergic reactions to medicines.  A blood clot that breaks free and travels to other parts of your body.  The possible return of an abnormal heart rhythm within hours or days after the procedure.  Your heart stopping (cardiac arrest). This is rare. What happens before the procedure? Medicines  Your health care provider may have you start taking: ? Blood-thinning medicines (anticoagulants) so your blood does not clot as easily. ? Medicines to help stabilize your heart rate and rhythm.  Ask your health care provider about: ? Changing or stopping your regular medicines. This is especially important if you are taking diabetes medicines or blood thinners. ? Taking medicines such as aspirin and ibuprofen. These medicines can thin your blood. Do not take these medicines unless your health care provider tells you to take them. ? Taking over-the-counter medicines, vitamins, herbs, and supplements. General instructions  Follow instructions from your health care provider about eating or drinking restrictions.  Plan to have someone take you home from the hospital or  clinic.  If you will be going home right after the procedure, plan to have someone with you for 24 hours.  Ask your health care provider what steps will be taken to help prevent infection. These may include washing your skin with a germ-killing soap. What happens during the procedure?   An IV will  be inserted into one of your veins.  Sticky patches (electrodes) or metal paddles may be placed on your chest.  You will be given a medicine to help you relax (sedative).  An electrical shock will be delivered. The procedure may vary among health care providers and hospitals. What can I expect after the procedure?  Your blood pressure, heart rate, breathing rate, and blood oxygen level will be monitored until you leave the hospital or clinic.  Your heart rhythm will be watched to make sure it does not change.  You may have some redness on the skin where the shocks were given. Follow these instructions at home:  Do not drive for 24 hours if you were given a sedative during your procedure.  Take over-the-counter and prescription medicines only as told by your health care provider.  Ask your health care provider how to check your pulse. Check it often.  Rest for 48 hours after the procedure or as told by your health care provider.  Avoid or limit your caffeine use as told by your health care provider.  Keep all follow-up visits as told by your health care provider. This is important. Contact a health care provider if:  You feel like your heart is beating too quickly or your pulse is not regular.  You have a serious muscle cramp that does not go away. Get help right away if:  You have discomfort in your chest.  You are dizzy or you feel faint.  You have trouble breathing or you are short of breath.  Your speech is slurred.  You have trouble moving an arm or leg on one side of your body.  Your fingers or toes turn cold or blue. Summary  Electrical cardioversion is the delivery of a jolt of electricity to restore a normal rhythm to the heart.  This procedure may be done right away in an emergency or may be a scheduled procedure if the condition is not an emergency.  Generally, this is a safe procedure.  After the procedure, check your pulse often as told by your health  care provider. This information is not intended to replace advice given to you by your health care provider. Make sure you discuss any questions you have with your health care provider. Document Revised: 11/27/2018 Document Reviewed: 11/27/2018 Elsevier Patient Education  [place].

## 2019-12-26 NOTE — Progress Notes (Signed)
Cardiology Office Note:    Date:  12/26/2019   ID:  Michael Haas, DOB June 27, 1928, MRN [id number]  PCP:  Barbie Banner, MD  Cardiologist:  Jodelle Red, MD  Referring MD: Barbie Banner, MD   CC: follow up  History of Present Illness:    Michael Haas is a 86 y.o. male with a hx of atrial fibrillation, chronic systolic and diastolic heart failure, hypertension, hyperlipidemia, chronic venous stasis ulcers who is seen for follow up today. I initially saw him 09/13/19 as a new consult at the request of Barbie Banner, MD for the evaluation and management of atrial fibrillation and heart failure.  Today: Feeling well overall. Has energy to get around, uses his walker. Feels that he is gradually getting more energy. Has been doing yardwork, as long as he takes it slow he doesn't feel he is limited. Can mow the whole yard on his riding lawnmower for 2 hours without stopping.  Using lasix, feels it makes him urinate frequently. Takes in AM, feels that it makes him urinate all day. Weighs at home, weight has only fluctuated within 3 lbs the entire month. Very trivial LE edema, wearing compression stockings today.  Discussed cardioversion, see below.   Blood pressure is better today. Discussed adding ARB, changing furosemide to every other day. He feels that things are working and would rather not change.   Doesn't check heart rate/BP regularly at home. His wife checks for him and has told him it was good. Had to walk back to his car today because he forgot his mask, walked a lot more than usual. Manually rechecked pulse for 30 seconds, got HR 88 bpm.   No issues with the blood thinner. No melena/hematochezia/hematuria. Bruises easily, no significant bleeding.   Denies chest pain, shortness of breath at rest or with normal exertion. No PND, orthopnea, LE edema or unexpected weight gain. No syncope or palpitations.   Past Medical History:  Diagnosis Date  . Hyperlipidemia   .  Hypertension     Past Surgical History:  Procedure Laterality Date  . HERNIA REPAIR    . left hand surgery     "It was caught in a machine years ago"    Current Medications: Current Outpatient Medications on File Prior to Visit  Medication Sig  . apixaban (ELIQUIS) 2.5 MG TABS tablet Take 1 tablet (2.5 mg total) by mouth 2 (two) times daily.  Marland Kitchen atorvastatin (LIPITOR) 40 MG tablet Take 1 tablet (40 mg total) by mouth daily.  . furosemide (LASIX) 20 MG tablet Take 1 tablet (20 mg total) by mouth daily.  . metoprolol succinate (TOPROL-XL) 100 MG 24 hr tablet Take 1 tablet (100 mg total) by mouth daily. Take with or immediately following a meal.   No current facility-administered medications on file prior to visit.     Allergies:   Patient has no known allergies.   Social History   Tobacco Use  . Smoking status: Never Smoker  . Smokeless tobacco: Former Neurosurgeon    Types: Engineer, drilling  . Vaping Use: Never used  Substance Use Topics  . Alcohol use: Not Currently    Comment: last time 20 years ago  . Drug use: Never    Family History: No history of premature cardiovascular disease  ROS:   Please see the history of present illness.  Additional pertinent ROS otherwise unremarkable.  EKGs/Labs/Other Studies Reviewed:    The following studies were reviewed today: Echo 08/17/19 1.  Left ventricular ejection fraction, by estimation, is 45 to 50%. The left ventricle has mildly decreased function. The left ventricle demonstrates global hypokinesis. There is mild concentric left ventricular hypertrophy. Left ventricular diastolic function could not be evaluated.  2. Right ventricular systolic function is normal. The right ventricular size is normal. There is mildly elevated pulmonary artery systolic pressure.  3. Left atrial size was mildly dilated.  4. Right atrial size was mildly dilated.  5. The mitral valve is normal in structure. No evidence of mitral valve regurgitation.    6. Tricuspid valve regurgitation is mild to moderate.  7. The aortic valve is tricuspid. Aortic valve regurgitation is not visualized. Mild aortic valve stenosis.  8. The inferior vena cava is normal in size with <50% respiratory variability, suggesting right atrial pressure of 8 mmHg.   EKG:  EKG is personally reviewed.  The ekg ordered today demonstrates atrial fibrillation, rate 102 bpm, RBBB  Recent Labs: 08/16/2019: B Natriuretic Peptide 1,896.9; Magnesium 2.0 08/21/2019: ALT 25 09/13/2019: BUN 32; Creatinine, Ser 1.51; Hemoglobin 15.0; Platelets 288; Potassium 4.8; Sodium 146  Recent Lipid Panel No results found for: CHOL, TRIG, HDL, CHOLHDL, VLDL, LDLCALC, LDLDIRECT  Physical Exam:    VS:  BP 130/76   Pulse (!) 102   Ht 5\' 7"  (1.702 m)   Wt 159 lb 9.6 oz (72.4 kg)   SpO2 97%   BMI 25.00 kg/m     Wt Readings from Last 3 Encounters:  12/26/19 159 lb 9.6 oz (72.4 kg)  11/14/19 157 lb 3.2 oz (71.3 kg)  09/25/19 156 lb 3.2 oz (70.9 kg)    GEN: Well nourished, well developed in no acute distress HEENT: Normal, moist mucous membranes NECK: No JVD CARDIAC: irregularly irregular rhythm, normal S1 and S2, no rubs or gallops. No murmur. VASCULAR: Radial and DP pulses 2+ bilaterally. No carotid bruits RESPIRATORY:  Clear to auscultation without rales, wheezing or rhonchi  ABDOMEN: Soft, non-tender, non-distended MUSCULOSKELETAL:  Ambulates independently SKIN: Warm and dry, bilateral trace LE edema NEUROLOGIC:  Alert and oriented x 3. No focal neuro deficits noted. PSYCHIATRIC:  Normal affect   ASSESSMENT:    1. Longstanding persistent atrial fibrillation (HCC)   2. Chronic combined systolic and diastolic heart failure (HCC)   3. Current use of long term anticoagulation   4. Cardiac risk counseling   5. Counseling on health promotion and disease prevention    PLAN:    Atrial fibrillation: likely now longstanding persistent to permanent -CHA2DS2/VAS Stroke Risk  Points=4 -tolerating apixaban -we discussed cardioversion today, discussed risk/benefit. He declines cardioversion at this time but will contact me if he changes his mind. Gave him information on cardioversion today. -continue rate control with metoprolol succinate  Chronic systolic and diastolic heart failure:  -EF 45-50% 08/2019 -we discussed options for medication changes today. He feels his current regimen is working well -we have discussed ischemic evaluation. Declines at this time  Cardiac risk counseling and prevention recommendations: -recommend heart healthy/Mediterranean diet, with whole grains, fruits, vegetable, fish, lean meats, nuts, and olive oil. Limit salt. -recommend moderate walking, 3-5 times/week for 30-50 minutes each session. Aim for at least 150 minutes.week. Goal should be pace of 3 miles/hours, or walking 1.5 miles in 30 minutes -recommend avoidance of tobacco products. Avoid excess alcohol.  Plan for follow up: 3 mos or sooner as needed  09/2019, MD, PhD [place]  [facility]    Medication Adjustments/Labs and Tests Ordered: Current medicines are reviewed at length with  the patient today.  Concerns regarding medicines are outlined above.  Orders Placed This Encounter  Procedures  . EKG 12-Lead   No orders of the defined types were placed in this encounter.   Patient Instructions  Medication Instructions:  Your Physician recommend you continue on your current medication as directed.    *If you need a refill on your cardiac medications before your next appointment, please call your pharmacy*   Lab Work: None   Testing/Procedures: None   Follow-Up: At [facility], you and your health needs are our priority.  As part of our continuing mission to provide you with exceptional heart care, we have created designated Provider Care Teams.  These Care Teams include your primary Cardiologist (physician) and Advanced Practice Providers  (APPs -  Physician Assistants and Nurse Practitioners) who all work together to provide you with the care you need, when you need it.  We recommend signing up for the patient portal called "MyChart".  Sign up information is provided on this After Visit Summary.  MyChart is used to connect with patients for Virtual Visits (Telemedicine).  Patients are able to view lab/test results, encounter notes, upcoming appointments, etc.  Non-urgent messages can be sent to your provider as well.   To learn more about what you can do with MyChart, go to [email or web address].    Your next appointment:   3 month(s)  The format for your next appointment:   In Person  Provider:   Jodelle RedBridgette Quinisha Mould, MD     Electrical Cardioversion Electrical cardioversion is the delivery of a jolt of electricity to restore a normal rhythm to the heart. A rhythm that is too fast or is not regular keeps the heart from pumping well. In this procedure, sticky patches or metal paddles are placed on the chest to deliver electricity to the heart from a device. This procedure may be done in an emergency if:  There is low or no blood pressure as a result of the heart rhythm.  Normal rhythm must be restored as fast as possible to protect the brain and heart from further damage.  It may save a life. This may also be a scheduled procedure for irregular or fast heart rhythms that are not immediately life-threatening. Tell a health care provider about:  Any allergies you have.  All medicines you are taking, including vitamins, herbs, eye drops, creams, and over-the-counter medicines.  Any problems you or family members have had with anesthetic medicines.  Any blood disorders you have.  Any surgeries you have had.  Any medical conditions you have.  Whether you are pregnant or may be pregnant. What are the risks? Generally, this is a safe procedure. However, problems may occur, including:  Allergic reactions to  medicines.  A blood clot that breaks free and travels to other parts of your body.  The possible return of an abnormal heart rhythm within hours or days after the procedure.  Your heart stopping (cardiac arrest). This is rare. What happens before the procedure? Medicines  Your health care provider may have you start taking: ? Blood-thinning medicines (anticoagulants) so your blood does not clot as easily. ? Medicines to help stabilize your heart rate and rhythm.  Ask your health care provider about: ? Changing or stopping your regular medicines. This is especially important if you are taking diabetes medicines or blood thinners. ? Taking medicines such as aspirin and ibuprofen. These medicines can thin your blood. Do not take these medicines unless your health care provider  tells you to take them. ? Taking over-the-counter medicines, vitamins, herbs, and supplements. General instructions  Follow instructions from your health care provider about eating or drinking restrictions.  Plan to have someone take you home from the hospital or clinic.  If you will be going home right after the procedure, plan to have someone with you for 24 hours.  Ask your health care provider what steps will be taken to help prevent infection. These may include washing your skin with a germ-killing soap. What happens during the procedure?   An IV will be inserted into one of your veins.  Sticky patches (electrodes) or metal paddles may be placed on your chest.  You will be given a medicine to help you relax (sedative).  An electrical shock will be delivered. The procedure may vary among health care providers and hospitals. What can I expect after the procedure?  Your blood pressure, heart rate, breathing rate, and blood oxygen level will be monitored until you leave the hospital or clinic.  Your heart rhythm will be watched to make sure it does not change.  You may have some redness on the skin  where the shocks were given. Follow these instructions at home:  Do not drive for 24 hours if you were given a sedative during your procedure.  Take over-the-counter and prescription medicines only as told by your health care provider.  Ask your health care provider how to check your pulse. Check it often.  Rest for 48 hours after the procedure or as told by your health care provider.  Avoid or limit your caffeine use as told by your health care provider.  Keep all follow-up visits as told by your health care provider. This is important. Contact a health care provider if:  You feel like your heart is beating too quickly or your pulse is not regular.  You have a serious muscle cramp that does not go away. Get help right away if:  You have discomfort in your chest.  You are dizzy or you feel faint.  You have trouble breathing or you are short of breath.  Your speech is slurred.  You have trouble moving an arm or leg on one side of your body.  Your fingers or toes turn cold or blue. Summary  Electrical cardioversion is the delivery of a jolt of electricity to restore a normal rhythm to the heart.  This procedure may be done right away in an emergency or may be a scheduled procedure if the condition is not an emergency.  Generally, this is a safe procedure.  After the procedure, check your pulse often as told by your health care provider. This information is not intended to replace advice given to you by your health care provider. Make sure you discuss any questions you have with your health care provider. Document Revised: 11/27/2018 Document Reviewed: 11/27/2018 Elsevier Patient Education  2020 [employer].    Signed, Jodelle Red, MD PhD 12/26/2019  [facility]

## 2020-03-28 ENCOUNTER — Ambulatory Visit (INDEPENDENT_AMBULATORY_CARE_PROVIDER_SITE_OTHER): Payer: Medicare Other | Admitting: Cardiology

## 2020-03-28 ENCOUNTER — Encounter: Payer: Self-pay | Admitting: Cardiology

## 2020-03-28 VITALS — BP 160/98 | HR 84 | Ht 68.0 in | Wt 164.0 lb

## 2020-03-28 DIAGNOSIS — Z7901 Long term (current) use of anticoagulants: Secondary | ICD-10-CM | POA: Diagnosis not present

## 2020-03-28 DIAGNOSIS — I4811 Longstanding persistent atrial fibrillation: Secondary | ICD-10-CM

## 2020-03-28 DIAGNOSIS — I4821 Permanent atrial fibrillation: Secondary | ICD-10-CM

## 2020-03-28 DIAGNOSIS — I10 Essential (primary) hypertension: Secondary | ICD-10-CM

## 2020-03-28 DIAGNOSIS — I5042 Chronic combined systolic (congestive) and diastolic (congestive) heart failure: Secondary | ICD-10-CM

## 2020-03-28 NOTE — Progress Notes (Signed)
Cardiology Office Note:    Date:  03/28/2020   ID:  Michael Haas, DOB April 21, 1929, MRN [id number]  PCP:  Barbie Banner, MD  Cardiologist:  Jodelle Red, MD  Referring MD: Barbie Banner, MD   CC: follow up  History of Present Illness:    Michael Haas is a 86 y.o. male with a hx of atrial fibrillation, chronic systolic and diastolic heart failure, hypertension, hyperlipidemia, chronic venous stasis ulcers who is seen for follow up today. I initially saw him 09/13/19 as a new consult at the request of Barbie Banner, MD for the evaluation and management of atrial fibrillation and heart failure.  Today: Doing well overall. Weight is 154 lbs at home this morning. Minimal LE edema. Energy is good. Tolerating apixaban, no melena/hematochezia/hematuria.  Wife says he refuses to let her take his blood pressure at home. Elevated in office today. Discussed options of adding medication today or measuring home blood pressures. He will check BP at home and call me.  Denies chest pain, shortness of breath at rest or with normal exertion. No PND, orthopnea, LE edema or unexpected weight gain. No syncope or palpitations.  Counseled on salt avoidance. This has been difficult for him. Discussed how this affects his blood pressure.  Past Medical History:  Diagnosis Date   Hyperlipidemia    Hypertension     Past Surgical History:  Procedure Laterality Date   HERNIA REPAIR     left hand surgery     "It was caught in a machine years ago"    Current Medications: Current Outpatient Medications on File Prior to Visit  Medication Sig   apixaban (ELIQUIS) 2.5 MG TABS tablet Take 1 tablet (2.5 mg total) by mouth 2 (two) times daily.   atorvastatin (LIPITOR) 40 MG tablet Take 1 tablet (40 mg total) by mouth daily.   furosemide (LASIX) 20 MG tablet Take 1 tablet (20 mg total) by mouth daily.   metoprolol succinate (TOPROL-XL) 100 MG 24 hr tablet Take 1 tablet (100 mg total) by mouth  daily. Take with or immediately following a meal.   No current facility-administered medications on file prior to visit.     Allergies:   Patient has no known allergies.   Social History   Tobacco Use   Smoking status: Never Smoker   Smokeless tobacco: Former Neurosurgeon    Types: Associate Professor Use: Never used  Substance Use Topics   Alcohol use: Not Currently    Comment: last time 20 years ago   Drug use: Never    Family History: No history of premature cardiovascular disease  ROS:   Please see the history of present illness.  Additional pertinent ROS otherwise unremarkable.  EKGs/Labs/Other Studies Reviewed:    The following studies were reviewed today: Echo 08/17/19 1. Left ventricular ejection fraction, by estimation, is 45 to 50%. The left ventricle has mildly decreased function. The left ventricle demonstrates global hypokinesis. There is mild concentric left ventricular hypertrophy. Left ventricular diastolic function could not be evaluated.  2. Right ventricular systolic function is normal. The right ventricular size is normal. There is mildly elevated pulmonary artery systolic pressure.  3. Left atrial size was mildly dilated.  4. Right atrial size was mildly dilated.  5. The mitral valve is normal in structure. No evidence of mitral valve regurgitation.  6. Tricuspid valve regurgitation is mild to moderate.  7. The aortic valve is tricuspid. Aortic valve regurgitation is not visualized.  Mild aortic valve stenosis.  8. The inferior vena cava is normal in size with <50% respiratory variability, suggesting right atrial pressure of 8 mmHg.   EKG:  EKG is personally reviewed.  The ekg ordered today demonstrates atrial fibrillation, rate 84 bpm, RBBB, PVC  Recent Labs: 08/16/2019: B Natriuretic Peptide 1,896.9; Magnesium 2.0 08/21/2019: ALT 25 09/13/2019: BUN 32; Creatinine, Ser 1.51; Hemoglobin 15.0; Platelets 288; Potassium 4.8; Sodium 146  Recent Lipid  Panel No results found for: CHOL, TRIG, HDL, CHOLHDL, VLDL, LDLCALC, LDLDIRECT  Physical Exam:    VS:  BP (!) 160/98    Pulse 84    Ht 5\' 8"  (1.727 m)    Wt 164 lb (74.4 kg)    SpO2 99%    BMI 24.94 kg/m     Wt Readings from Last 3 Encounters:  03/28/20 164 lb (74.4 kg)  12/26/19 159 lb 9.6 oz (72.4 kg)  11/14/19 157 lb 3.2 oz (71.3 kg)    GEN: Well nourished, well developed in no acute distress HEENT: Normal, moist mucous membranes NECK: No JVD CARDIAC: regular rhythm, normal S1 and S2, no rubs or gallops. No murmur. VASCULAR: Radial and DP pulses 2+ bilaterally. No carotid bruits RESPIRATORY:  Clear to auscultation without rales, wheezing or rhonchi  ABDOMEN: Soft, non-tender, non-distended MUSCULOSKELETAL:  Ambulates independently SKIN: Warm and dry, left leg with trace edema, right leg without edema NEUROLOGIC:  Alert and oriented x 3. No focal neuro deficits noted. PSYCHIATRIC:  Normal affect   ASSESSMENT:    1. Longstanding persistent atrial fibrillation (HCC)   2. Chronic combined systolic and diastolic heart failure (HCC)   3. Current use of long term anticoagulation   4. Permanent atrial fibrillation (HCC)   5. Essential hypertension    PLAN:    Atrial fibrillation: permanent -CHA2DS2/VAS Stroke Risk Points=4 -tolerating apixaban -we have discussed attempt at rhythm control previously. He feels fine in his afib and wishes to continue with rate control -continue metoprolol succinate  Chronic systolic and diastolic heart failure:  -EF 45-50% 08/2019 -euvolemic on exam today -we have discussed ischemic evaluation. Declines at this time -on furosemide and metoprolol succinate -with elevated blood pressure today, we discussed ARB. See below. Did well on lisinopril in the past  Hypertension: -elevated today -has not been checking at home -we discussed options, including starting medication now vs home BP checking. He would like to proceed with home BP checking and  will send me measurements. If remains elevated, would start ARB  Cardiac risk counseling and prevention recommendations: -recommend heart healthy/Mediterranean diet, with whole grains, fruits, vegetable, fish, lean meats, nuts, and olive oil. Limit salt. -recommend moderate walking, 3-5 times/week for 30-50 minutes each session. Aim for at least 150 minutes.week. Goal should be pace of 3 miles/hours, or walking 1.5 miles in 30 minutes -recommend avoidance of tobacco products. Avoid excess alcohol.  Plan for follow up: 3 mos video visit per patient preference  09/2019, MD, PhD [place]   [facility]    Medication Adjustments/Labs and Tests Ordered: Current medicines are reviewed at length with the patient today.  Concerns regarding medicines are outlined above.  Orders Placed This Encounter  Procedures   EKG 12-Lead   No orders of the defined types were placed in this encounter.   Patient Instructions  Medication Instructions:  Your Physician recommend you continue on your current medication as directed.    *If you need a refill on your cardiac medications before your next appointment, please call your  pharmacy*   Lab Work: None   Testing/Procedures: None   Follow-Up: At [facility], you and your health needs are our priority.  As part of our continuing mission to provide you with exceptional heart care, we have created designated Provider Care Teams.  These Care Teams include your primary Cardiologist (physician) and Advanced Practice Providers (APPs -  Physician Assistants and Nurse Practitioners) who all work together to provide you with the care you need, when you need it.  We recommend signing up for the patient portal called "MyChart".  Sign up information is provided on this After Visit Summary.  MyChart is used to connect with patients for Virtual Visits (Telemedicine).  Patients are able to view lab/test results, encounter notes, upcoming  appointments, etc.  Non-urgent messages can be sent to your provider as well.   To learn more about what you can do with MyChart, go to [email or web address].    Your next appointment:   3 month(s)  The format for your next appointment:   Virtual Visit   Provider:   Jodelle Red, MD   Other Instructions Check blood pressure three times/week and call me after a few weeks to let me know how it is going.   Signed, Jodelle Red, MD PhD 03/28/2020  [facility]

## 2020-03-28 NOTE — Patient Instructions (Addendum)
Medication Instructions:  Your Physician recommend you continue on your current medication as directed.    *If you need a refill on your cardiac medications before your next appointment, please call your pharmacy*   Lab Work: None   Testing/Procedures: None   Follow-Up: At [facility], you and your health needs are our priority.  As part of our continuing mission to provide you with exceptional heart care, we have created designated Provider Care Teams.  These Care Teams include your primary Cardiologist (physician) and Advanced Practice Providers (APPs -  Physician Assistants and Nurse Practitioners) who all work together to provide you with the care you need, when you need it.  We recommend signing up for the patient portal called "MyChart".  Sign up information is provided on this After Visit Summary.  MyChart is used to connect with patients for Virtual Visits (Telemedicine).  Patients are able to view lab/test results, encounter notes, upcoming appointments, etc.  Non-urgent messages can be sent to your provider as well.   To learn more about what you can do with MyChart, go to [email or web address].    Your next appointment:   3 month(s)  The format for your next appointment:   Virtual Visit   Provider:   Jodelle Red, MD   Other Instructions Check blood pressure three times/week and call me after a few weeks to let me know how it is going.

## 2020-07-01 ENCOUNTER — Encounter: Payer: Self-pay | Admitting: Cardiology

## 2020-07-01 ENCOUNTER — Telehealth (INDEPENDENT_AMBULATORY_CARE_PROVIDER_SITE_OTHER): Payer: Medicare Other | Admitting: Cardiology

## 2020-07-01 VITALS — BP 165/104 | HR 106 | Ht 68.0 in | Wt 152.6 lb

## 2020-07-01 DIAGNOSIS — I4821 Permanent atrial fibrillation: Secondary | ICD-10-CM | POA: Diagnosis not present

## 2020-07-01 DIAGNOSIS — I10 Essential (primary) hypertension: Secondary | ICD-10-CM

## 2020-07-01 DIAGNOSIS — Z7901 Long term (current) use of anticoagulants: Secondary | ICD-10-CM

## 2020-07-01 DIAGNOSIS — I5042 Chronic combined systolic (congestive) and diastolic (congestive) heart failure: Secondary | ICD-10-CM

## 2020-07-01 MED ORDER — AMLODIPINE BESYLATE 5 MG PO TABS: 5.0000 mg | ORAL_TABLET | Freq: Every day | ORAL | 3 refills | Status: DC

## 2020-07-01 NOTE — Progress Notes (Signed)
Virtual Visit via Telephone Note   This visit type was conducted due to national recommendations for restrictions regarding the COVID-19 Pandemic (e.g. social distancing) in an effort to limit this patient's exposure and mitigate transmission in our community.  Due to his co-morbid illnesses, this patient is at least at moderate risk for complications without adequate follow up.  This format is felt to be most appropriate for this patient at this time.  The patient did not have access to video technology/had technical difficulties with video requiring transitioning to audio format only (telephone).  All issues noted in this document were discussed and addressed.  No physical exam could be performed with this format.  Please refer to the patient's chart for his  consent to telehealth for [facility].   The patient was identified using 2 identifiers.  Patient Location: Home Provider Location: Home Office  Date:  07/01/2020   ID:  Michael Haas, Michael Haas 10-02-1928, MRN [id number]  PCP:  Barbie Banner, MD  Cardiologist:  Jodelle Red, MD  Referring MD: Barbie Banner, MD   CC: follow up  History of Present Illness:    Michael Haas is a 87 y.o. male with a hx of permanent atrial fibrillation, chronic systolic and diastolic heart failure with EF 45-50%, hypertension, hyperlipidemia, chronic venous stasis ulcers who is seen for follow up today. I initially saw him 09/13/19 as a new consult at the request of Barbie Banner, MD for the evaluation and management of atrial fibrillation and heart failure.  Today: Wife and patient on the phone together. No LE edema any more. Breathing is stable. No bleeding issues.  Has been checking BP at home. Has been high. Wife notes that he eats a lot of salt and sugar, despite her efforts. Eats chips, bacon, sausage. He has avoided this for the last 5 days and BP still elevated. BP 172/107, 162/106  Urine remains dark, almost rust colored. Has not  improved since he left the hospital. Drinks a lot of juice but no water only. Hasn't seen his PCP since post hospital discharge. Discussed that they need to follow up with Dr. Andrey Campanile, they will make appointment. We discussed that he needs labs, they would like to see if they can get with Dr. Andrey Campanile, but if they cannot get an appt soon they will call me and we will arrange for labs.  Denies chest pain, shortness of breath at rest or with normal exertion. No PND, orthopnea, LE edema or unexpected weight gain. No syncope or palpitations.  Past Medical History:  Diagnosis Date  . Hyperlipidemia   . Hypertension     Past Surgical History:  Procedure Laterality Date  . HERNIA REPAIR    . left hand surgery     "It was caught in a machine years ago"    Current Medications: Current Outpatient Medications on File Prior to Visit  Medication Sig  . apixaban (ELIQUIS) 2.5 MG TABS tablet Take 1 tablet (2.5 mg total) by mouth 2 (two) times daily.  Marland Kitchen atorvastatin (LIPITOR) 40 MG tablet Take 1 tablet (40 mg total) by mouth daily.  . furosemide (LASIX) 20 MG tablet Take 1 tablet (20 mg total) by mouth daily.  . metoprolol succinate (TOPROL-XL) 100 MG 24 hr tablet Take 1 tablet (100 mg total) by mouth daily. Take with or immediately following a meal.   No current facility-administered medications on file prior to visit.     Allergies:   Patient has no known  allergies.   Social History   Tobacco Use  . Smoking status: Never Smoker  . Smokeless tobacco: Former Neurosurgeon    Types: Engineer, drilling  . Vaping Use: Never used  Substance Use Topics  . Alcohol use: Not Currently    Comment: last time 20 years ago  . Drug use: Never    Family History: No history of premature cardiovascular disease  ROS:   Please see the history of present illness.  Additional pertinent ROS otherwise unremarkable.  EKGs/Labs/Other Studies Reviewed:    The following studies were reviewed today: Echo 08/17/19 1. Left  ventricular ejection fraction, by estimation, is 45 to 50%. The left ventricle has mildly decreased function. The left ventricle demonstrates global hypokinesis. There is mild concentric left ventricular hypertrophy. Left ventricular diastolic function could not be evaluated.  2. Right ventricular systolic function is normal. The right ventricular size is normal. There is mildly elevated pulmonary artery systolic pressure.  3. Left atrial size was mildly dilated.  4. Right atrial size was mildly dilated.  5. The mitral valve is normal in structure. No evidence of mitral valve regurgitation.  6. Tricuspid valve regurgitation is mild to moderate.  7. The aortic valve is tricuspid. Aortic valve regurgitation is not visualized. Mild aortic valve stenosis.  8. The inferior vena cava is normal in size with <50% respiratory variability, suggesting right atrial pressure of 8 mmHg.   EKG:  EKG is personally reviewed.  The ekg ordered 03/28/20 demonstrates atrial fibrillation, rate 84 bpm, RBBB, PVC  Recent Labs: 08/16/2019: B Natriuretic Peptide 1,896.9; Magnesium 2.0 08/21/2019: ALT 25 09/13/2019: BUN 32; Creatinine, Ser 1.51; Hemoglobin 15.0; Platelets 288; Potassium 4.8; Sodium 146  Recent Lipid Panel No results found for: CHOL, TRIG, HDL, CHOLHDL, VLDL, LDLCALC, LDLDIRECT  Physical Exam:    VS:  BP (!) 165/104   Pulse (!) 106   Ht 5\' 8"  (1.727 m)   Wt 152 lb 9.6 oz (69.2 kg)   SpO2 98%   BMI 23.20 kg/m     Wt Readings from Last 3 Encounters:  07/01/20 152 lb 9.6 oz (69.2 kg)  03/28/20 164 lb (74.4 kg)  12/26/19 159 lb 9.6 oz (72.4 kg)    Speaking comfortably on the phone, no audible wheezing In no acute distress Alert and oriented Normal affect Normal speech  ASSESSMENT:    1. Essential hypertension   2. Permanent atrial fibrillation (HCC)   3. Chronic combined systolic and diastolic heart failure (HCC)   4. Current use of long term anticoagulation    PLAN:    Atrial  fibrillation: permanent -CHA2DS2/VAS Stroke Risk Points=4 -tolerating apixaban -we have discussed attempt at rhythm control previously. He feels fine in his afib and wishes to continue with rate control -continue metoprolol succinate  Chronic systolic and diastolic heart failure:  -EF 45-50% 08/2019 -we have discussed ischemic evaluation. Declines. -on furosemide and metoprolol succinate -with elevated blood pressure today, we discussed ARB, however urine is dark. Will hold on this -reports edema is fully resolved  Hypertension: -elevated today -has not been checking at home -we discussed options for management today. With his dark urine, we will start amlodipine instead of ARB. Prefer ARB given systolic heart failure, but need to preserve his kidneys. He will followup with his PCP about his urine -discussed salt avoidance  Cardiac risk counseling and prevention recommendations: -recommend heart healthy/Mediterranean diet, with whole grains, fruits, vegetable, fish, lean meats, nuts, and olive oil. Limit salt. -recommend moderate walking, 3-5 times/week  for 30-50 minutes each session. Aim for at least 150 minutes.week. Goal should be pace of 3 miles/hours, or walking 1.5 miles in 30 minutes -recommend avoidance of tobacco products. Avoid excess alcohol.  Plan for follow up: 4-6 weeks  Today, I have spent 21 minutes with the patient with telehealth technology discussing the above problems.  Additional time spent in chart review, documentation, and communication.  Jodelle Red, MD, PhD, [facility]  [facility]    Medication Adjustments/Labs and Tests Ordered: Current medicines are reviewed at length with the patient today.  Concerns regarding medicines are outlined above.  No orders of the defined types were placed in this encounter.  Meds ordered this encounter  Medications  . amLODipine (NORVASC) 5 MG tablet    Sig: Take 1 tablet (5 mg total) by mouth daily.     Dispense:  90 tablet    Refill:  3    Patient Instructions  Medication Instructions:  Start Amlodipine 5 mg daily    *If you need a refill on your cardiac medications before your next appointment, please call your pharmacy*   Lab Work: None  Testing/Procedures: None   Follow-Up: At [facility], you and your health needs are our priority.  As part of our continuing mission to provide you with exceptional heart care, we have created designated Provider Care Teams.  These Care Teams include your primary Cardiologist (physician) and Advanced Practice Providers (APPs -  Physician Assistants and Nurse Practitioners) who all work together to provide you with the care you need, when you need it.  We recommend signing up for the patient portal called "MyChart".  Sign up information is provided on this After Visit Summary.  MyChart is used to connect with patients for Virtual Visits (Telemedicine).  Patients are able to view lab/test results, encounter notes, upcoming appointments, etc.  Non-urgent messages can be sent to your provider as well.   To learn more about what you can do with MyChart, go to [email or web address].    Your next appointment:   4-6 week(s)  The format for your next appointment:   In Person  Provider:   Jodelle Red, MD       Signed, Jodelle Red, MD PhD 07/01/2020  [facility]

## 2020-07-01 NOTE — Patient Instructions (Signed)
Medication Instructions:  Start Amlodipine 5 mg daily    *If you need a refill on your cardiac medications before your next appointment, please call your pharmacy*   Lab Work: None  Testing/Procedures: None   Follow-Up: At [facility], you and your health needs are our priority.  As part of our continuing mission to provide you with exceptional heart care, we have created designated Provider Care Teams.  These Care Teams include your primary Cardiologist (physician) and Advanced Practice Providers (APPs -  Physician Assistants and Nurse Practitioners) who all work together to provide you with the care you need, when you need it.  We recommend signing up for the patient portal called "MyChart".  Sign up information is provided on this After Visit Summary.  MyChart is used to connect with patients for Virtual Visits (Telemedicine).  Patients are able to view lab/test results, encounter notes, upcoming appointments, etc.  Non-urgent messages can be sent to your provider as well.   To learn more about what you can do with MyChart, go to [email or web address].    Your next appointment:   4-6 week(s)  The format for your next appointment:   In Person  Provider:   Jodelle Red, MD

## 2020-07-07 DIAGNOSIS — Z79899 Other long term (current) drug therapy: Secondary | ICD-10-CM

## 2020-07-09 ENCOUNTER — Encounter: Payer: Self-pay | Admitting: Cardiology

## 2020-07-09 ENCOUNTER — Telehealth: Payer: Self-pay | Admitting: Cardiology

## 2020-07-09 NOTE — Telephone Encounter (Signed)
    Pt was called 3x with no response. Letter sent   Parke Poisson, RN  P Cv Div Nl Scheduling Please schedule pt an appointment with Dr. Cristal Deer in 4-6 weeks. Virtual or in person   07/01/20 11:20am LVM to schedule f/u with Dr. Cristal Deer in 4-6 weeks (OV or VV) per staff - LCN     07/03/20 8:14am LVM to schedule 4-6 week f/u with Dr. Cristal Deer (OV or VV) - LCN     07/07/20 8:59 am - called pt to schedule appt with Dr. Tanja Port (OV or VV) - AH

## 2020-07-11 NOTE — Telephone Encounter (Signed)
Can we order BMET and CBC for them? Thanks!

## 2020-07-11 NOTE — Telephone Encounter (Signed)
Lab orders placed.  

## 2020-07-14 LAB — CBC
Hematocrit: 41.3 % (ref 37.5–51.0)
Hemoglobin: 14.1 g/dL (ref 13.0–17.7)
MCH: 32.5 pg (ref 26.6–33.0)
MCHC: 34.1 g/dL (ref 31.5–35.7)
MCV: 95 fL (ref 79–97)
Platelets: 301 10*3/uL (ref 150–450)
RBC: 4.34 x10E6/uL (ref 4.14–5.80)
RDW: 12.5 % (ref 11.6–15.4)
WBC: 11.1 10*3/uL — ABNORMAL HIGH (ref 3.4–10.8)

## 2020-07-14 LAB — BASIC METABOLIC PANEL
BUN/Creatinine Ratio: 16 (ref 10–24)
BUN: 20 mg/dL (ref 10–36)
CO2: 23 mmol/L (ref 20–29)
Calcium: 9.1 mg/dL (ref 8.6–10.2)
Chloride: 102 mmol/L (ref 96–106)
Creatinine, Ser: 1.27 mg/dL (ref 0.76–1.27)
Glucose: 117 mg/dL — ABNORMAL HIGH (ref 65–99)
Potassium: 3.8 mmol/L (ref 3.5–5.2)
Sodium: 141 mmol/L (ref 134–144)
eGFR: 54 mL/min/{1.73_m2} — ABNORMAL LOW (ref >59)

## 2020-09-10 ENCOUNTER — Other Ambulatory Visit: Payer: Self-pay | Admitting: Cardiology

## 2020-09-10 DIAGNOSIS — I5042 Chronic combined systolic (congestive) and diastolic (congestive) heart failure: Secondary | ICD-10-CM

## 2020-09-10 DIAGNOSIS — I4819 Other persistent atrial fibrillation: Secondary | ICD-10-CM

## 2020-09-18 ENCOUNTER — Other Ambulatory Visit: Payer: Self-pay | Admitting: Cardiology

## 2020-09-18 DIAGNOSIS — I4819 Other persistent atrial fibrillation: Secondary | ICD-10-CM

## 2020-09-18 NOTE — Telephone Encounter (Signed)
Pt requests refil for eliquis 2.5mg  but qualifies for 5mg  will route to pharmd pool. 84m, 74.4kg, scr 1.27 07/14/20, lovw/christopher 03/28/20

## 2020-09-19 NOTE — Telephone Encounter (Signed)
Per Dr Di Kindle notes: "we discussed anticoagulation at length, risk of stroke. He is an easy bleeder and is concerneder"   "will start apixaban 2.5 mg BID today -stop aspirin"  Low dose apixaban started d/t increased risk of bleeding.

## 2020-09-29 ENCOUNTER — Other Ambulatory Visit: Payer: Self-pay | Admitting: Cardiology

## 2020-09-29 DIAGNOSIS — I5042 Chronic combined systolic (congestive) and diastolic (congestive) heart failure: Secondary | ICD-10-CM

## 2020-10-15 ENCOUNTER — Other Ambulatory Visit: Payer: Self-pay | Admitting: Cardiology

## 2020-10-15 DIAGNOSIS — I5042 Chronic combined systolic (congestive) and diastolic (congestive) heart failure: Secondary | ICD-10-CM

## 2020-10-15 NOTE — Progress Notes (Signed)
Cardiology Office Note:    Date:  10/17/2020   ID:  [name], DOB 27-Oct-1928, MRN [id number]  PCP:  Barbie Banner, MD  Cardiologist:  Jodelle Red, MD  Referring MD: Barbie Banner, MD   CC: follow up  History of Present Illness:    Michael Haas is a 87 y.o. male with a hx of permanent atrial fibrillation, chronic systolic and diastolic heart failure with EF 45-50%, hypertension, hyperlipidemia, chronic venous stasis ulcers who is seen for follow up today. I initially saw him 09/13/19 as a new consult at the request of Barbie Banner, MD for the evaluation and management of atrial fibrillation and heart failure.  Today: Overall, he is feeling good. He is able to drive himself and shop for groceries. He uses his walker and reports having no falls.  His urine is lighter in color lately. He no longer drinks soda, and now drinks tea. There is no hematuria or blood in his stool. For his diet he is also cooking his own food most of the time.  Occasionally he notices minor LE edema, but he is wearing compression socks and doing well today.  There is a raised lesion on his right forearm. He notes having several similar lesions on his forearm, but they have all resolved except the one. He does not know their etiology.  He denies any chest pain, shortness of breath, palpitations, or exertional symptoms. No headaches, lightheadedness, or syncope to report. Also has no orthopnea or PND.   Past Medical History:  Diagnosis Date   Hyperlipidemia    Hypertension     Past Surgical History:  Procedure Laterality Date   HERNIA REPAIR     left hand surgery     "It was caught in a machine years ago"    Current Medications: Current Outpatient Medications on File Prior to Visit  Medication Sig   amLODipine (NORVASC) 5 MG tablet Take 1 tablet (5 mg total) by mouth daily.   atorvastatin (LIPITOR) 40 MG tablet TAKE 1 TABLET(40 MG) BY MOUTH DAILY   ELIQUIS 2.5 MG TABS tablet TAKE 1  TABLET(2.5 MG) BY MOUTH TWICE DAILY   furosemide (LASIX) 20 MG tablet TAKE 1 TABLET(20 MG) BY MOUTH DAILY   metoprolol succinate (TOPROL-XL) 100 MG 24 hr tablet TAKE 1 TABLET(100 MG) BY MOUTH DAILY WITH OR IMMEDIATELY FOLLOWING A MEAL   No current facility-administered medications on file prior to visit.     Allergies:   Patient has no known allergies.   Social History   Tobacco Use   Smoking status: Never   Smokeless tobacco: Former    Types: Chew    Quit date: 06/18/2019  Vaping Use   Vaping Use: Never used  Substance Use Topics   Alcohol use: Not Currently    Comment: last time 20 years ago   Drug use: Never    Family History: No history of premature cardiovascular disease  ROS:   Please see the history of present illness.   (+) Lesion, Right forearm Additional pertinent ROS otherwise unremarkable.  EKGs/Labs/Other Studies Reviewed:    The following studies were reviewed today:  Echo 08/17/19 1. Left ventricular ejection fraction, by estimation, is 45 to 50%. The left ventricle has mildly decreased function. The left ventricle demonstrates global hypokinesis. There is mild concentric left ventricular hypertrophy. Left ventricular diastolic function could not be evaluated.   2. Right ventricular systolic function is normal. The right ventricular size is normal. There is mildly elevated  pulmonary artery systolic pressure.   3. Left atrial size was mildly dilated.   4. Right atrial size was mildly dilated.   5. The mitral valve is normal in structure. No evidence of mitral valve regurgitation.   6. Tricuspid valve regurgitation is mild to moderate.   7. The aortic valve is tricuspid. Aortic valve regurgitation is not visualized. Mild aortic valve stenosis.   8. The inferior vena cava is normal in size with <50% respiratory variability, suggesting right atrial pressure of 8 mmHg.   EKG:  EKG is personally reviewed.   10/17/2020: Atrial fibrillation, rate 97 bpm, RBBB, PVC vs  aberrancy 03/28/20: atrial fibrillation, rate 84 bpm, RBBB, PVC  Recent Labs: 07/14/2020: BUN 20; Creatinine, Ser 1.27; Hemoglobin 14.1; Platelets 301; Potassium 3.8; Sodium 141  Recent Lipid Panel No results found for: CHOL, TRIG, HDL, CHOLHDL, VLDL, LDLCALC, LDLDIRECT  Physical Exam:    VS:  BP 122/64 (BP Location: Left Arm, Patient Position: Sitting, Cuff Size: Normal)   Pulse 97   Ht 5\' 8"  (1.727 m)   Wt 157 lb (71.2 kg)   BMI 23.87 kg/m     Wt Readings from Last 3 Encounters:  10/17/20 157 lb (71.2 kg)  07/01/20 152 lb 9.6 oz (69.2 kg)  03/28/20 164 lb (74.4 kg)    GEN: Well nourished, well developed in no acute distress HEENT: Normal, moist mucous membranes NECK: No JVD CARDIAC: irregularly irregular rhythm, normal S1 and S2, no rubs or gallops. No murmur. VASCULAR: Radial and DP pulses 2+ bilaterally. No carotid bruits RESPIRATORY:  Clear to auscultation without rales, wheezing or rhonchi  ABDOMEN: Soft, non-tender, non-distended MUSCULOSKELETAL:  Ambulates independently with rollator walker SKIN: Warm and dry, no edema NEUROLOGIC:  Alert and oriented x 3. No focal neuro deficits noted. PSYCHIATRIC:  Normal affect    ASSESSMENT:    1. Permanent atrial fibrillation (HCC)   2. Chronic combined systolic and diastolic heart failure (HCC)   3. Current use of long term anticoagulation   4. Essential hypertension   5. Counseling on health promotion and disease prevention    PLAN:    Atrial fibrillation: permanent -CHA2DS2/VAS Stroke Risk Points=4 -tolerating apixaban -we have discussed attempt at rhythm control previously. He feels fine in his afib and wishes to continue with rate control -continue metoprolol succinate  Chronic systolic and diastolic heart failure:  -EF 45-50% 08/2019 -NYHA class I-II (limited exertion for assessment) -we have discussed ischemic evaluation. Declines. -on furosemide and metoprolol succinate -we have discussed ARB in the past, BP is  variable, continue to monitor  Hypertension: -well controlled today -see prior discussion re: ARBs -discussed salt avoidance  Cardiac risk counseling and prevention recommendations: -recommend heart healthy/Mediterranean diet, with whole grains, fruits, vegetable, fish, lean meats, nuts, and olive oil. Limit salt. -recommend moderate walking, 3-5 times/week for 30-50 minutes each session. Aim for at least 150 minutes.week. Goal should be pace of 3 miles/hours, or walking 1.5 miles in 30 minutes -recommend avoidance of tobacco products. Avoid excess alcohol.  Plan for follow up: 6 months  09/2019, MD, PhD, [facility] [place]  [facility]    Medication Adjustments/Labs and Tests Ordered: Current medicines are reviewed at length with the patient today.  Concerns regarding medicines are outlined above.  Orders Placed This Encounter  Procedures   EKG 12-Lead   No orders of the defined types were placed in this encounter.   Patient Instructions  Medication Instructions:  Your Physician recommend you continue on your current medication  as directed.    *If you need a refill on your cardiac medications before your next appointment, please call your pharmacy*   Lab Work: None ordered today   Testing/Procedures: None ordered today   Follow-Up: At [facility], you and your health needs are our priority.  As part of our continuing mission to provide you with exceptional heart care, we have created designated Provider Care Teams.  These Care Teams include your primary Cardiologist (physician) and Advanced Practice Providers (APPs -  Physician Assistants and Nurse Practitioners) who all work together to provide you with the care you need, when you need it.  We recommend signing up for the patient portal called "MyChart".  Sign up information is provided on this After Visit Summary.  MyChart is used to connect with patients for Virtual Visits (Telemedicine).  Patients  are able to view lab/test results, encounter notes, upcoming appointments, etc.  Non-urgent messages can be sent to your provider as well.   To learn more about what you can do with MyChart, go to [email or web address].    Your next appointment:   6 month(s) @ [address] [place], [place] [address]   The format for your next appointment:   In Person  Provider:   Jodelle Red, MD     [facility],acting as a scribe for Jodelle Red, MD.,have documented all relevant documentation on the behalf of Jodelle Red, MD,as directed by  Jodelle Red, MD while in the presence of Jodelle Red, MD.  I, Jodelle Red, MD, have reviewed all documentation for this visit. The documentation on 10/17/20 for the exam, diagnosis, procedures, and orders are all accurate and complete.   Signed, Jodelle Red, MD PhD 10/17/2020  [facility]

## 2020-10-17 ENCOUNTER — Ambulatory Visit (INDEPENDENT_AMBULATORY_CARE_PROVIDER_SITE_OTHER): Payer: Medicare Other | Admitting: Cardiology

## 2020-10-17 ENCOUNTER — Other Ambulatory Visit: Payer: Self-pay

## 2020-10-17 ENCOUNTER — Encounter: Payer: Self-pay | Admitting: Cardiology

## 2020-10-17 VITALS — BP 122/64 | HR 97 | Ht 68.0 in | Wt 157.0 lb

## 2020-10-17 DIAGNOSIS — I10 Essential (primary) hypertension: Secondary | ICD-10-CM | POA: Diagnosis not present

## 2020-10-17 DIAGNOSIS — I4821 Permanent atrial fibrillation: Secondary | ICD-10-CM | POA: Diagnosis not present

## 2020-10-17 DIAGNOSIS — Z7901 Long term (current) use of anticoagulants: Secondary | ICD-10-CM

## 2020-10-17 DIAGNOSIS — I5042 Chronic combined systolic (congestive) and diastolic (congestive) heart failure: Secondary | ICD-10-CM | POA: Diagnosis not present

## 2020-10-17 DIAGNOSIS — Z7189 Other specified counseling: Secondary | ICD-10-CM

## 2020-10-17 NOTE — Patient Instructions (Signed)
Medication Instructions:  Your Physician recommend you continue on your current medication as directed.    *If you need a refill on your cardiac medications before your next appointment, please call your pharmacy*   Lab Work: None ordered today   Testing/Procedures: None ordered today   Follow-Up: At [facility], you and your health needs are our priority.  As part of our continuing mission to provide you with exceptional heart care, we have created designated Provider Care Teams.  These Care Teams include your primary Cardiologist (physician) and Advanced Practice Providers (APPs -  Physician Assistants and Nurse Practitioners) who all work together to provide you with the care you need, when you need it.  We recommend signing up for the patient portal called "MyChart".  Sign up information is provided on this After Visit Summary.  MyChart is used to connect with patients for Virtual Visits (Telemedicine).  Patients are able to view lab/test results, encounter notes, upcoming appointments, etc.  Non-urgent messages can be sent to your provider as well.   To learn more about what you can do with MyChart, go to [email or web address].    Your next appointment:   6 month(s) @ [address] [place], [place] [address]   The format for your next appointment:   In Person  Provider:   Bridgette Christopher, MD     

## 2021-03-21 ENCOUNTER — Other Ambulatory Visit: Payer: Self-pay | Admitting: Cardiology

## 2021-03-21 DIAGNOSIS — I4819 Other persistent atrial fibrillation: Secondary | ICD-10-CM

## 2021-03-23 NOTE — Telephone Encounter (Signed)
89 M 71.2 kg, SCr 1.27.  SCr has fluctuated over time most readings in the past 2-3 years have been > 1.5.  Will continue with 2.5 mg dose for now and continue to watch

## 2021-06-24 ENCOUNTER — Ambulatory Visit (INDEPENDENT_AMBULATORY_CARE_PROVIDER_SITE_OTHER): Payer: Medicare Other | Admitting: Cardiology

## 2021-06-24 ENCOUNTER — Telehealth (HOSPITAL_BASED_OUTPATIENT_CLINIC_OR_DEPARTMENT_OTHER): Payer: Self-pay | Admitting: *Deleted

## 2021-06-24 ENCOUNTER — Encounter (HOSPITAL_BASED_OUTPATIENT_CLINIC_OR_DEPARTMENT_OTHER): Payer: Self-pay | Admitting: Cardiology

## 2021-06-24 ENCOUNTER — Other Ambulatory Visit: Payer: Self-pay

## 2021-06-24 VITALS — BP 100/58 | HR 85 | Ht 68.0 in | Wt 159.0 lb

## 2021-06-24 DIAGNOSIS — Z7901 Long term (current) use of anticoagulants: Secondary | ICD-10-CM | POA: Diagnosis not present

## 2021-06-24 DIAGNOSIS — G4730 Sleep apnea, unspecified: Secondary | ICD-10-CM

## 2021-06-24 DIAGNOSIS — Z79899 Other long term (current) drug therapy: Secondary | ICD-10-CM

## 2021-06-24 DIAGNOSIS — I4821 Permanent atrial fibrillation: Secondary | ICD-10-CM

## 2021-06-24 DIAGNOSIS — I5042 Chronic combined systolic (congestive) and diastolic (congestive) heart failure: Secondary | ICD-10-CM

## 2021-06-24 DIAGNOSIS — Z7189 Other specified counseling: Secondary | ICD-10-CM

## 2021-06-24 DIAGNOSIS — I4819 Other persistent atrial fibrillation: Secondary | ICD-10-CM

## 2021-06-24 DIAGNOSIS — I10 Essential (primary) hypertension: Secondary | ICD-10-CM

## 2021-06-24 NOTE — Patient Instructions (Signed)
Medication Instructions:  Your Physician recommend you continue on your current medication as directed.    *If you need a refill on your cardiac medications before your next appointment, please call your pharmacy*   Lab Work: Your provider has recommended lab work today (CBC, BMP). Please have this collected at [facility] at [place]. The lab is open 8:00 am - 4:30 pm. Please avoid 12:00p - 1:00p for lunch hour. You do not need an appointment. Please go to [address] [place], [place] [address]. This is in the Primary Care office on the 3rd floor, let them know you are there for blood work and they will direct you to the lab.    Testing/Procedures: WatchPAT?  Is a FDA cleared portable home sleep study test that uses a watch and 3 points of contact to monitor 7 different channels, including your heart rate, oxygen saturations, body position, snoring, and chest motion.  The study is easy to use from the comfort of your own home and accurately detect sleep apnea.  Before bed, you attach the chest sensor, attached the sleep apnea bracelet to your nondominant hand, and attach the finger probe.  After the study, the raw data is downloaded from the watch and scored for apnea events.   For more information: [email or web address]    Follow-Up: At [facility], you and your health needs are our priority.  As part of our continuing mission to provide you with exceptional heart care, we have created designated Provider Care Teams.  These Care Teams include your primary Cardiologist (physician) and Advanced Practice Providers (APPs -  Physician Assistants and Nurse Practitioners) who all work together to provide you with the care you need, when you need it.  We recommend signing up for the patient portal called "MyChart".  Sign up information is provided on this After Visit Summary.  MyChart is used to connect with patients for Virtual Visits (Telemedicine).   Patients are able to view lab/test results, encounter notes, upcoming appointments, etc.  Non-urgent messages can be sent to your provider as well.   To learn more about what you can do with MyChart, go to [email or web address].    Your next appointment:   1 year(s)  The format for your next appointment:   In Person  Provider:   Jodelle Red, MD

## 2021-06-24 NOTE — Telephone Encounter (Signed)
Call and spoke with pt wife letting her know pt can put on sleep device, pin given to pt wife

## 2021-06-24 NOTE — Progress Notes (Signed)
Cardiology Office Note:    Date:  06/24/2021   ID:  Michael Haas, MRN [id number]  PCP:  Michael Sacramento, MD  Cardiologist:  Michael Dresser, MD  Referring MD: Michael Sacramento, MD   CC: follow up  History of Present Illness:    Michael Haas is a 88 y.o. male with a hx of permanent atrial fibrillation, chronic systolic and diastolic heart failure with EF 45-50%, hypertension, hyperlipidemia, chronic venous stasis ulcers who is seen for follow up today. I initially saw him 09/13/19 as a new consult at the request of Michael Sacramento, MD for the evaluation and management of atrial fibrillation and heart failure.  Today: He is doing well and is accompanied by his spouse. She believes he is not breathing well when he is sleeping at night, noting that some times he appears to stop breathing. She endorses that he snores at night and he makes a whistling sound when he breathes all the time. He becomes congested at night and has to breathe through his mouth. We did his STOP-BANG score today, and it was 7. Discussed sleep study, and they are amenable.  He does not regularly check his weight, blood pressure, or oxygen level at home. He notices he is swelling when he can feel it in his ankles and wrists.   His spouse also reports his urine is an orange color chronically, no hematuria or dysuria. Michael Haas prefers to drink teas and juices and does not drink water.  The 55-month prescription of Eliquis is too expensive for him now because he is in the donut hole. He would prefer a 61-month prescription. We reviewed criteria to continue reduced dose; his kidneys have improved from prior and he may need full dose. Will check labs today.  He denies any chest pain or exertional symptoms. No headaches, lightheadedness, or syncope. Also has no orthopnea or PND.  Past Medical History:  Diagnosis Date   Hyperlipidemia    Hypertension     Past Surgical History:  Procedure Laterality Date    HERNIA REPAIR     left hand surgery     "It was caught in a machine years ago"    Current Medications: Current Outpatient Medications on File Prior to Visit  Medication Sig   amLODipine (NORVASC) 5 MG tablet Take 1 tablet (5 mg total) by mouth daily.   atorvastatin (LIPITOR) 40 MG tablet TAKE 1 TABLET(40 MG) BY MOUTH DAILY   ELIQUIS 2.5 MG TABS tablet TAKE 1 TABLET(2.5 MG) BY MOUTH TWICE DAILY   furosemide (LASIX) 20 MG tablet TAKE 1 TABLET(20 MG) BY MOUTH DAILY   metoprolol succinate (TOPROL-XL) 100 MG 24 hr tablet TAKE 1 TABLET(100 MG) BY MOUTH DAILY WITH OR IMMEDIATELY FOLLOWING A MEAL   No current facility-administered medications on file prior to visit.     Allergies:   Patient has no known allergies.   Social History   Tobacco Use   Smoking status: Never   Smokeless tobacco: Former    Types: Chew    Quit date: 06/18/2019  Vaping Use   Vaping Use: Never used  Substance Use Topics   Alcohol use: Not Currently    Comment: last time 20 years ago   Drug use: Never    Family History: No history of premature cardiovascular disease  ROS:   Please see the history of present illness.   (+) Snores (+) Shortness of breath (+) Swelling (+) Dark urine color Additional pertinent ROS otherwise  unremarkable.  EKGs/Labs/Other Studies Reviewed:    The following studies were reviewed today: Echo 08/17/19 1. Left ventricular ejection fraction, by estimation, is 45 to 50%. The left ventricle has mildly decreased function. The left ventricle demonstrates global hypokinesis. There is mild concentric left ventricular hypertrophy. Left ventricular diastolic function could not be evaluated.   2. Right ventricular systolic function is normal. The right ventricular size is normal. There is mildly elevated pulmonary artery systolic pressure.   3. Left atrial size was mildly dilated.   4. Right atrial size was mildly dilated.   5. The mitral valve is normal in structure. No evidence of mitral  valve regurgitation.   6. Tricuspid valve regurgitation is mild to moderate.   7. The aortic valve is tricuspid. Aortic valve regurgitation is not visualized. Mild aortic valve stenosis.   8. The inferior vena cava is normal in size with <50% respiratory variability, suggesting right atrial pressure of 8 mmHg.   EKG:  EKG is personally reviewed.   06/24/2021: atrial fibrillation, 85 bpm, RBBB 10/17/2020: Atrial fibrillation, rate 97 bpm, RBBB, PVC vs aberrancy 03/28/20: atrial fibrillation, rate 84 bpm, RBBB, PVC  Recent Labs: 07/14/2020: BUN 20; Creatinine, Ser 1.27; Hemoglobin 14.1; Platelets 301; Potassium 3.8; Sodium 141  Recent Lipid Panel No results found for: CHOL, TRIG, HDL, CHOLHDL, VLDL, LDLCALC, LDLDIRECT  Physical Exam:    VS:  BP (!) 100/58    Pulse 85    Ht 5\' 8"  (1.727 m)    Wt 159 lb (72.1 kg)    SpO2 95%    BMI 24.18 kg/m     Wt Readings from Last 3 Encounters:  06/24/21 159 lb (72.1 kg)  10/17/20 157 lb (71.2 kg)  07/01/20 152 lb 9.6 oz (69.2 kg)    GEN: Well nourished, well developed in no acute distress HEENT: Normal, moist mucous membranes NECK: No JVD CARDIAC: irregularly irregular rhythm, normal S1 and S2, no rubs or gallops. No murmur. VASCULAR: Radial and DP pulses 2+ bilaterally. No carotid bruits RESPIRATORY:  Clear to auscultation without rales, wheezing or rhonchi  ABDOMEN: Soft, non-tender, non-distended MUSCULOSKELETAL:  Ambulates independently with rollator walker SKIN: Warm and dry, no edema NEUROLOGIC:  Alert and oriented x 3. No focal neuro deficits noted. PSYCHIATRIC:  Normal affect    ASSESSMENT:    1. Permanent atrial fibrillation ([place])   2. Sleep apnea, unspecified type   3. Chronic combined systolic and diastolic heart failure ([place])   4. Current use of long term anticoagulation   5. Essential hypertension   6. Counseling on health promotion and disease prevention   7. Persistent atrial fibrillation ([place])   8. Medication management      PLAN:    Atrial fibrillation: permanent -CHA2DS2/VAS Stroke Risk Points=4 -tolerating apixaban, currently at reduced dose but renal function improved and weight >60 kg. We will recheck labs today and send refills of apixaban based on his results. They would prefer 30 day fills instead of 90 day. They would be interested in dabigatran when the generic becomes available. -continue metoprolol succinate  Chronic systolic and diastolic heart failure:  -EF 45-50% 08/2019 -NYHA class I-II (limited exertion for assessment) -we have discussed ischemic evaluation. Declines. -on furosemide and metoprolol succinate -we have discussed ARB in the past, BP is variable, continue to monitor  Hypertension: -well controlled today -see prior discussion re: ARBs -discussed salt avoidance -he does not feel poorly, but if BP remains on the low side, would stop amlodipine. They will follow up with  their PCP on this.  Cardiac risk counseling and prevention recommendations: -recommend heart healthy/Mediterranean diet, with whole grains, fruits, vegetable, fish, lean meats, nuts, and olive oil. Limit salt. -recommend moderate walking, 3-5 times/week for 30-50 minutes each session. Aim for at least 150 minutes.week. Goal should be pace of 3 miles/hours, or walking 1.5 miles in 30 minutes -recommend avoidance of tobacco products. Avoid excess alcohol.  Plan for follow up: 1 year  Michael Dresser, MD, PhD, [facility]    Medication Adjustments/Labs and Tests Ordered: Current medicines are reviewed at length with the patient today.  Concerns regarding medicines are outlined above.  Orders Placed This Encounter  Procedures   CBC   Basic Metabolic Panel (BMET)   EKG 12-Lead   [facility] Sleep Study   No orders of the defined types were placed in this encounter.    Patient Instructions  Medication Instructions:  Your Physician recommend you continue on your current medication  as directed.    *If you need a refill on your cardiac medications before your next appointment, please call your pharmacy*   Lab Work: Your provider has recommended lab work today (CBC, BMP). Please have this collected at [facility] at [place]. The lab is open 8:00 am - 4:30 pm. Please avoid 12:00p - 1:00p for lunch hour. You do not need an appointment. Please go to [address] [place], [place] [address]. This is in the Primary Care office on the 3rd floor, let them know you are there for blood work and they will direct you to the lab.    Testing/Procedures: WatchPAT?  Is a FDA cleared portable home sleep study test that uses a watch and 3 points of contact to monitor 7 different channels, including your heart rate, oxygen saturations, body position, snoring, and chest motion.  The study is easy to use from the comfort of your own home and accurately detect sleep apnea.  Before bed, you attach the chest sensor, attached the sleep apnea bracelet to your nondominant hand, and attach the finger probe.  After the study, the raw data is downloaded from the watch and scored for apnea events.   For more information: [email or web address]    Follow-Up: At [facility], you and your health needs are our priority.  As part of our continuing mission to provide you with exceptional heart care, we have created designated Provider Care Teams.  These Care Teams include your primary Cardiologist (physician) and Advanced Practice Providers (APPs -  Physician Assistants and Nurse Practitioners) who all work together to provide you with the care you need, when you need it.  We recommend signing up for the patient portal called "MyChart".  Sign up information is provided on this After Visit Summary.  MyChart is used to connect with patients for Virtual Visits (Telemedicine).  Patients are able to view lab/test results, encounter notes, upcoming appointments, etc.   Non-urgent messages can be sent to your provider as well.   To learn more about what you can do with MyChart, go to [email or web address].    Your next appointment:   1 year(s)  The format for your next appointment:   In Person  Provider:   Buford Dresser, MD        Wilhemina Bonito as a scribe for Michael Dresser, MD.,have documented all relevant documentation on the behalf of Michael Dresser, MD,as directed by  Michael Dresser, MD while in the presence of Michael Dresser, MD.  I, Michael Dresser,  MD, have reviewed all documentation for this visit. The documentation on 06/24/21 for the exam, diagnosis, procedures, and orders are all accurate and complete.    Signed, Michael Dresser, MD PhD 06/24/2021  [place]

## 2021-06-25 ENCOUNTER — Encounter (INDEPENDENT_AMBULATORY_CARE_PROVIDER_SITE_OTHER): Payer: Medicare Other | Admitting: Cardiology

## 2021-06-25 DIAGNOSIS — G4733 Obstructive sleep apnea (adult) (pediatric): Secondary | ICD-10-CM

## 2021-06-25 LAB — CBC
Hematocrit: 43.5 % (ref 37.5–51.0)
Hemoglobin: 14.9 g/dL (ref 13.0–17.7)
MCH: 33.3 pg — ABNORMAL HIGH (ref 26.6–33.0)
MCHC: 34.3 g/dL (ref 31.5–35.7)
MCV: 97 fL (ref 79–97)
Platelets: 335 10*3/uL (ref 150–450)
RBC: 4.48 x10E6/uL (ref 4.14–5.80)
RDW: 12.4 % (ref 11.6–15.4)
WBC: 11.1 10*3/uL — ABNORMAL HIGH (ref 3.4–10.8)

## 2021-06-25 LAB — BASIC METABOLIC PANEL
BUN/Creatinine Ratio: 15 (ref 10–24)
BUN: 19 mg/dL (ref 10–36)
CO2: 25 mmol/L (ref 20–29)
Calcium: 9.4 mg/dL (ref 8.6–10.2)
Chloride: 102 mmol/L (ref 96–106)
Creatinine, Ser: 1.24 mg/dL (ref 0.76–1.27)
Glucose: 108 mg/dL — ABNORMAL HIGH (ref 70–99)
Potassium: 4.3 mmol/L (ref 3.5–5.2)
Sodium: 142 mmol/L (ref 134–144)
eGFR: 55 mL/min/{1.73_m2} — ABNORMAL LOW (ref >59)

## 2021-06-26 NOTE — Procedures (Signed)
° °  Sleep Study Report  Patient Information Study Date: 06/25/21 Patient Name: Osie Amparo Patient ID: [id number] Birth Date: 06/11/2028 Age: 88 Gender: Male BMI: 24.1 (W=159 lb, H=5' 8'') Referring Physician: Jodelle Red, MD  TEST DESCRIPTION: Home sleep apnea testing was completed using the WatchPat, a Type 1 device, utilizing peripheral arterial tonometry (PAT), chest movement, actigraphy, pulse oximetry, pulse rate, body position and snore. AHI was calculated with apnea and hypopnea using valid sleep time as the denominator. RDI includes apneas, hypopneas, and RERAs. The data acquired and the scoring of sleep and all associated events were performed in accordance with the recommended standards and specifications as outlined in the AASM Manual for the Scoring of Sleep and Associated Events 2.2.0 (2015).  FINDINGS: 1. Severe Obstructive Sleep Apnea with AHI49 /hr. 2. Mild Central Sleep Apnea with pAHIc 12.6/hr with 33% Cheyne Stokes repirations. 3. Oxygen desaturations as low as 63%. 4. Mild snoring was present. O2 sats were < 88% for 34.4 min. 5. Total sleep time was 7 hrs and 18 min. 6. 24.4% of total sleep time was spent in REM sleep. 7. Normal sleep onset latency at 16 min. 8. Prolonged REM sleep onset latency at 250 min. 9. Total awakenings were 5.  DIAGNOSIS: Severe Obstructive Sleep Apnea (G47.33) Central Sleep Apnea  RECOMMENDATIONS: 1. Clinical correlation of these findings is necessary. The decision to treat obstructive sleep apnea (OSA) is usually based on the presence of apnea symptoms or the presence of associated medical conditions such as Hypertension, Congestive Heart Failure, Atrial Fibrillation or Obesity. The most common symptoms of OSA are snoring, gasping for breath while sleeping, daytime sleepiness and fatigue.  2. Initiating apnea therapy is recommended given the presence of symptoms and/or associated conditions. Recommend proceeding with one  of the following:   a. Auto-CPAP therapy with a pressure range of 5-20cm H2O.   b. An oral appliance (OA) that can be obtained from certain dentists with expertise in sleep medicine. These are primarily of use in non-obese patients with mild and moderate disease.   c. An ENT consultation which may be useful to look for specific causes of obstruction and possible treatment options.   d. If patient is intolerant to PAP therapy, consider referral to ENT for evaluation for hypoglossal nerve stimulator.  3. Close follow-up is necessary to ensure success with CPAP or oral appliance therapy for maximum benefit .  4. A follow-up oximetry study on CPAP is recommended to assess the adequacy of therapy and determine the need for supplemental oxygen or the potential need for Bi-level therapy. An arterial blood gas to determine the adequacy of baseline ventilation and oxygenation should also be considered.  5. Healthy sleep recommendations include: adequate nightly sleep (normal 7-9 hrs/night), avoidance of caffeine after noon and alcohol near bedtime, and maintaining a sleep environment that is cool, dark and quiet.  6. Weight loss for overweight patients is recommended. Even modest amounts of weight loss can significantly improve the severity of sleep apnea.  7. Snoring recommendations include: weight loss where appropriate, side sleeping, and avoidance of alcohol before bed.  8. Operation of motor vehicle should be avoided when sleepy.  Signature: Electronically Signed: 06/26/21 Armanda Magic, MD; [facility]; Diplomat, American Board of Sleep Medicine

## 2021-06-27 ENCOUNTER — Other Ambulatory Visit: Payer: Self-pay | Admitting: Cardiology

## 2021-06-27 DIAGNOSIS — I10 Essential (primary) hypertension: Secondary | ICD-10-CM

## 2021-06-29 ENCOUNTER — Other Ambulatory Visit: Payer: Self-pay | Admitting: Cardiology

## 2021-06-29 ENCOUNTER — Telehealth: Payer: Self-pay | Admitting: *Deleted

## 2021-06-29 ENCOUNTER — Ambulatory Visit: Payer: Medicare Other

## 2021-06-29 DIAGNOSIS — G4733 Obstructive sleep apnea (adult) (pediatric): Secondary | ICD-10-CM

## 2021-06-29 DIAGNOSIS — J9601 Acute respiratory failure with hypoxia: Secondary | ICD-10-CM

## 2021-06-29 DIAGNOSIS — I4811 Longstanding persistent atrial fibrillation: Secondary | ICD-10-CM

## 2021-06-29 DIAGNOSIS — G4730 Sleep apnea, unspecified: Secondary | ICD-10-CM

## 2021-06-29 NOTE — Telephone Encounter (Signed)
Patient and wife notified of sleep study results and recommendations. They agree to having CPAP titration. Appointment has been scheduled for 07/01/21.

## 2021-06-29 NOTE — Telephone Encounter (Signed)
Rx(s) sent to pharmacy electronically.  

## 2021-06-29 NOTE — Telephone Encounter (Signed)
-----   Message from Sueanne Margarita, MD sent at 06/26/2021 10:18 AM EST ----- Please let patient know that they have sleep apnea.  Recommend therapeutic CPAP titration for treatment of patient's sleep disordered breathing.  If unable to perform an in lab titration then initiate ResMed auto CPAP from 4 to 15cm H2O with heated humidity and mask of choice and overnight pulse ox on CPAP.

## 2021-07-01 ENCOUNTER — Ambulatory Visit (HOSPITAL_BASED_OUTPATIENT_CLINIC_OR_DEPARTMENT_OTHER): Payer: Medicare Other | Attending: Cardiology | Admitting: Cardiology

## 2021-07-01 ENCOUNTER — Other Ambulatory Visit: Payer: Self-pay

## 2021-07-01 VITALS — Ht 68.0 in | Wt 159.0 lb

## 2021-07-01 DIAGNOSIS — G4734 Idiopathic sleep related nonobstructive alveolar hypoventilation: Secondary | ICD-10-CM

## 2021-07-01 DIAGNOSIS — I4811 Longstanding persistent atrial fibrillation: Secondary | ICD-10-CM | POA: Diagnosis not present

## 2021-07-01 DIAGNOSIS — G4733 Obstructive sleep apnea (adult) (pediatric): Secondary | ICD-10-CM | POA: Insufficient documentation

## 2021-07-01 DIAGNOSIS — J9601 Acute respiratory failure with hypoxia: Secondary | ICD-10-CM | POA: Insufficient documentation

## 2021-07-01 DIAGNOSIS — I493 Ventricular premature depolarization: Secondary | ICD-10-CM | POA: Diagnosis not present

## 2021-07-03 NOTE — Procedures (Signed)
° °  Patient Name: Michael Haas, Michael Haas Date: 07/01/2021 Gender: Male D.O.B: [birth year] Age (years): 73 Referring Provider: Armanda Magic MD, ABSM Height (inches): 68 Interpreting Physician: Armanda Magic MD, ABSM Weight (lbs): 159 RPSGT: Ulyess Mort BMI: 24 MRN: [id number] Neck Size: 16.50  CLINICAL INFORMATION The patient is referred for a BiPAP titration to treat sleep apnea.  SLEEP STUDY TECHNIQUE As per the AASM Manual for the Scoring of Sleep and Associated Events v2.3 (April 2016) with a hypopnea requiring 4% desaturations.  The channels recorded and monitored were frontal, central and occipital EEG, electrooculogram (EOG), submentalis EMG (chin), nasal and oral airflow, thoracic and abdominal wall motion, anterior tibialis EMG, snore microphone, electrocardiogram, and pulse oximetry. Bilevel positive airway pressure (BPAP) was initiated at the beginning of the study and titrated to treat sleep-disordered breathing.  MEDICATIONS Medications self-administered by patient taken the night of the study : eliquis  RESPIRATORY PARAMETERS Optimal IPAP Pressure (cm): 24  AHI at Optimal Pressure (/hr) 0 Optimal EPAP Pressure (cm):20  Overall Minimal O2 (%):79.0 Minimal O2 at Optimal Pressure (%): 90.0  SLEEP ARCHITECTURE Start Time:10:34:10 PM  Stop Time:4:49:09 AM  Total Time (min):375  Total Sleep Time (min):225.5 Sleep Latency (min):13.2  Sleep Efficiency (%):60.1%  REM Latency (min):74.0  WASO (min):136.3 Stage N1 (%): 23.7%  Stage N2 (%): 65.2%  Stage N3 (%): 0.0%  Stage R (%): 11.1 Supine (%):100.00  Arousal Index (/hr):33.3   CARDIAC DATA The 2 lead EKG demonstrated atrial fibrillation. The mean heart rate was 75.1 beats per minute. Other EKG findings include:PVCs.  LEG MOVEMENT DATA The total Periodic Limb Movements of Sleep (PLMS) were 0. The PLMS index was 0.0. A PLMS index of <15 is considered normal in adults.  IMPRESSIONS - An optimal PAP pressure was  selected for this patient ( 24/20 cm of water) - Mild Central Sleep Apnea was noted during this titration (CAI = 7.5/h). - Severe oxygen desaturations were observed during this titration (min O2 = 79.0%). - The patient snored with soft snoring volume. - 2-lead EKG demonstrated: Atrial Fibrillation, PVCs - Clinically significant periodic limb movements were not noted during this study. Arousals associated with PLMs were rare.  DIAGNOSIS - Obstructive Sleep Apnea (G47.33) - Nocturnal Hypoxemia  RECOMMENDATIONS - Trial of auto BiPAP therapy with IPAP max 24cm H2O, EPAP min 5cm H2O and PS 4cm H2O with a Small size Fisher&Paykel Full Face Simplus mask with additional chin strap and heated humidification. - Avoid alcohol, sedatives and other CNS depressants that may worsen sleep apnea and disrupt normal sleep architecture. - Sleep hygiene should be reviewed to assess factors that may improve sleep quality. - Weight management and regular exercise should be initiated or continued. - Return to Sleep Center for re-evaluation after 6 weeks of therapy  [Electronically signed] 07/03/2021 11:37 AM  Armanda Magic MD, ABSM Diplomate, American Board of Sleep Medicine

## 2021-07-06 ENCOUNTER — Telehealth: Payer: Self-pay | Admitting: *Deleted

## 2021-07-06 DIAGNOSIS — G4733 Obstructive sleep apnea (adult) (pediatric): Secondary | ICD-10-CM

## 2021-07-06 NOTE — Telephone Encounter (Signed)
The patient has been notified of the result and verbalized understanding.  All questions (if any) were answered. Marolyn Hammock, [place] 07/06/2021 12:59 PM    Upon patient request DME selection is [facility] Patient understands he will be contacted by [facility] to set up his cpap. Patient understands to call if [facility] does not contact him with new setup in a timely manner. Patient understands they will be called once confirmation has been received from [facility]/ that they have received their new machine to schedule 10 week follow up appointment.   [facility] notified of new cpap order  Please add to airview Patient was grateful for the call and thanked me.

## 2021-07-06 NOTE — Telephone Encounter (Signed)
-----   Message from Gaynelle Cage, [place] sent at 07/03/2021  2:42 PM EST -----  ----- Message ----- From: Quintella Reichert, MD Sent: 07/03/2021  11:39 AM EST To: [facility] Sleep Studies  Please let patient know that they had a successful PAP titration and let DME know that orders are in EPIC.  Please set up 6 week OV with me. Needs overnight pulse ox on BIPAP

## 2021-09-04 ENCOUNTER — Other Ambulatory Visit: Payer: Self-pay | Admitting: Cardiology

## 2021-09-04 DIAGNOSIS — I4819 Other persistent atrial fibrillation: Secondary | ICD-10-CM

## 2021-09-04 DIAGNOSIS — I5042 Chronic combined systolic (congestive) and diastolic (congestive) heart failure: Secondary | ICD-10-CM

## 2021-09-04 NOTE — Telephone Encounter (Signed)
Rx(s) sent to pharmacy electronically.  

## 2021-09-22 NOTE — Telephone Encounter (Signed)
very large mask leak - please get an appt in person with DME to check his mask and get a download in 4 weeks. ?

## 2021-09-22 NOTE — Addendum Note (Signed)
Addended by: Reesa Chew on: 09/22/2021 09:40 AM ? ? Modules accepted: Orders ? ?

## 2021-09-22 NOTE — Telephone Encounter (Signed)
Order placed to [facility] via community message. 

## 2021-09-28 ENCOUNTER — Other Ambulatory Visit: Payer: Self-pay | Admitting: Cardiology

## 2021-09-28 DIAGNOSIS — I4819 Other persistent atrial fibrillation: Secondary | ICD-10-CM

## 2021-09-28 NOTE — Telephone Encounter (Signed)
Prescription refill request for Eliquis received. Indication:Afib Last office visit:2/23 Scr:1.2 Age: 88 Weight:72.1 kg  Prescription refilled

## 2021-10-01 ENCOUNTER — Telehealth: Payer: Self-pay | Admitting: *Deleted

## 2021-10-01 ENCOUNTER — Encounter: Payer: Self-pay | Admitting: Cardiology

## 2021-10-01 ENCOUNTER — Telehealth (INDEPENDENT_AMBULATORY_CARE_PROVIDER_SITE_OTHER): Payer: Medicare Other | Admitting: Cardiology

## 2021-10-01 VITALS — BP 157/85 | HR 71 | Ht 68.0 in | Wt 157.0 lb

## 2021-10-01 DIAGNOSIS — G4733 Obstructive sleep apnea (adult) (pediatric): Secondary | ICD-10-CM | POA: Diagnosis not present

## 2021-10-01 DIAGNOSIS — I10 Essential (primary) hypertension: Secondary | ICD-10-CM

## 2021-10-01 NOTE — Telephone Encounter (Signed)
4 week reminder made

## 2021-10-01 NOTE — Telephone Encounter (Signed)
Per Dr Mayford Knife download in 4 weeks - followup with me in 1 year

## 2021-10-01 NOTE — Progress Notes (Addendum)
Virtual Visit via Video Note   Because of Michael Haas's co-morbid illnesses, he is at least at moderate risk for complications without adequate follow up.  This format is felt to be most appropriate for this patient at this time.  All issues noted in this document were discussed and addressed.  A limited physical exam was performed with this format.  Please refer to the patient's chart for his consent to telehealth for Michael Haas.       Date:  10/01/2021   ID:  Michael Haas, DOB [birth year], MRN [id number] The patient was identified using 2 identifiers.  Patient Location: Home Provider Location: Office/Clinic   PCP:  Michael Banner, MD   [facility] Providers Cardiologist:  Michael Red, MD Sleep Medicine:  Michael Magic, MD     Evaluation Performed:  Follow-Up Visit  Chief Complaint:  OSA  History of Present Illness:    Michael Haas is a 88 y.o. male with a history of permanent atrial fibrillation, chronic systolic/diastolic CHF, hypertension who was referred by Michael Haas for evaluation for sleep apnea due to hypertension, CHF and atrial fibrillation.  He underwent home sleep study on 06/25/2021 showing severe obstructive sleep apnea with an AHI of 49/h and mild central sleep apnea with an AHI of 12.6/h with 33% Cheyne-Stokes respirations, nocturnal hypoxemia with O2 saturation 63% at lowest.  He was referred for CPAP titration but due to ongoing respiratory events could not be titrated and was placed on auto BiPAP with IPAP max 24 cm H2O, EPAP min 5 cm H2O and pressure support 4 cm H2O.  He is now here for follow-up.  He is doing well with his CPAP device and thinks that he has gotten used to it.  He tolerates the mask and feels the pressure is adequate.  Since going on CPAP he feels rested in the am and has no significant daytime sleepiness.  He denies any significant nasal dryness but has some mild nasal congestion.  He does occasionally has some mild mouth  dryness. He does not think that he snores.      Past Medical History:  Diagnosis Date   Hyperlipidemia    Hypertension    Past Surgical History:  Procedure Laterality Date   HERNIA REPAIR     left hand surgery     "It was caught in a machine years ago"     Current Meds  Medication Sig   amLODipine (NORVASC) 5 MG tablet TAKE 1 TABLET(5 MG) BY MOUTH DAILY   atorvastatin (LIPITOR) 40 MG tablet TAKE 1 TABLET(40 MG) BY MOUTH DAILY   ELIQUIS 2.5 MG TABS tablet TAKE 1 TABLET(2.5 MG) BY MOUTH TWICE DAILY   furosemide (LASIX) 20 MG tablet TAKE 1 TABLET(20 MG) BY MOUTH DAILY   metoprolol succinate (TOPROL-XL) 100 MG 24 hr tablet TAKE 1 TABLET(100 MG) BY MOUTH DAILY WITH OR IMMEDIATELY FOLLOWING A MEAL     Allergies:   Patient has no known allergies.   Social History   Tobacco Use   Smoking status: Never   Smokeless tobacco: Former    Types: Chew    Quit date: 06/18/2019  Vaping Use   Vaping Use: Never used  Substance Use Topics   Alcohol use: Not Currently    Comment: last time 20 years ago   Drug use: Never     Family Hx: The patient's family history is not on file.  ROS:   Please see the history of present illness.  All other systems reviewed and are negative.   Prior CV studies:   The following studies were reviewed today:  Home sleep study, BiPAP titration and Pap compliance download  Labs/Other Tests and Data Reviewed:    EKG:  No ECG reviewed.  Recent Labs: 06/24/2021: BUN 19; Creatinine, Ser 1.24; Hemoglobin 14.9; Platelets 335; Potassium 4.3; Sodium 142   Recent Lipid Panel No results found for: CHOL, TRIG, HDL, CHOLHDL, LDLCALC, LDLDIRECT  Wt Readings from Last 3 Encounters:  10/01/21 157 lb (71.2 kg)  07/01/21 159 lb (72.1 kg)  06/24/21 159 lb (72.1 kg)     STOP-Bang Score:  7      Objective:    Vital Signs:  BP (!) 157/85   Pulse 71   Ht 5\' 8"  (1.727 m)   Wt 157 lb (71.2 kg)   BMI 23.87 kg/m    VITAL SIGNS:  reviewed GEN:  no acute  distress EYES:  sclerae anicteric, EOMI - Extraocular Movements Intact RESPIRATORY:  normal respiratory effort, symmetric expansion CARDIOVASCULAR:  no peripheral edema SKIN:  no rash, lesions or ulcers. MUSCULOSKELETAL:  no obvious deformities. NEURO:  alert and oriented x 3, no obvious focal deficit PSYCH:  normal affect  ASSESSMENT & PLAN:    OSA - The patient is tolerating PAP therapy well without any problems. The PAP download performed by his DME was personally reviewed and interpreted by me today and showed an AHI of 12.9/hr on auto BiPAP cm H2O with 69% compliance in using more than 4 hours nightly.  The patient has been using and benefiting from PAP use and will continue to benefit from therapy.  -the % usage on the download is wrong because it ranged from March until June 20th and we are not in June yet - I will repeat the download in 4 weeks -I have encouraged him to sleep on his side and avoid sleeping on his back -I have also encouraged him to use nasal saline spray>>use 2 sprays in each nostril BID for nasal congestion  Hypertension -BP is borderline controlled at home -Continue prescription drug management with amlodipine 5 mg daily and Toprol-XL 100 mg daily with as needed refills   Time:   Today, I have spent 15 minutes with the patient with telehealth technology discussing the above problems.     Medication Adjustments/Labs and Tests Ordered: Current medicines are reviewed at length with the patient today.  Concerns regarding medicines are outlined above.   Tests Ordered: No orders of the defined types were placed in this encounter.   Medication Changes: No orders of the defined types were placed in this encounter.   Follow Up:  In Person in 1 year  Signed, July, MD  10/01/2021 10:07 AM    [facility]

## 2021-10-19 ENCOUNTER — Other Ambulatory Visit: Payer: Self-pay | Admitting: Cardiology

## 2021-10-19 DIAGNOSIS — I5042 Chronic combined systolic (congestive) and diastolic (congestive) heart failure: Secondary | ICD-10-CM

## 2021-10-19 NOTE — Telephone Encounter (Signed)
Rx(s) sent to pharmacy electronically.  

## 2021-12-15 IMAGING — CR DG CHEST 2V
2 series · 2 of 2 positions shown · non-contrast
Comparison: None.

CLINICAL DATA: Un witnessed fall.

EXAM:
CHEST - 2 VIEW

[x chest ap]
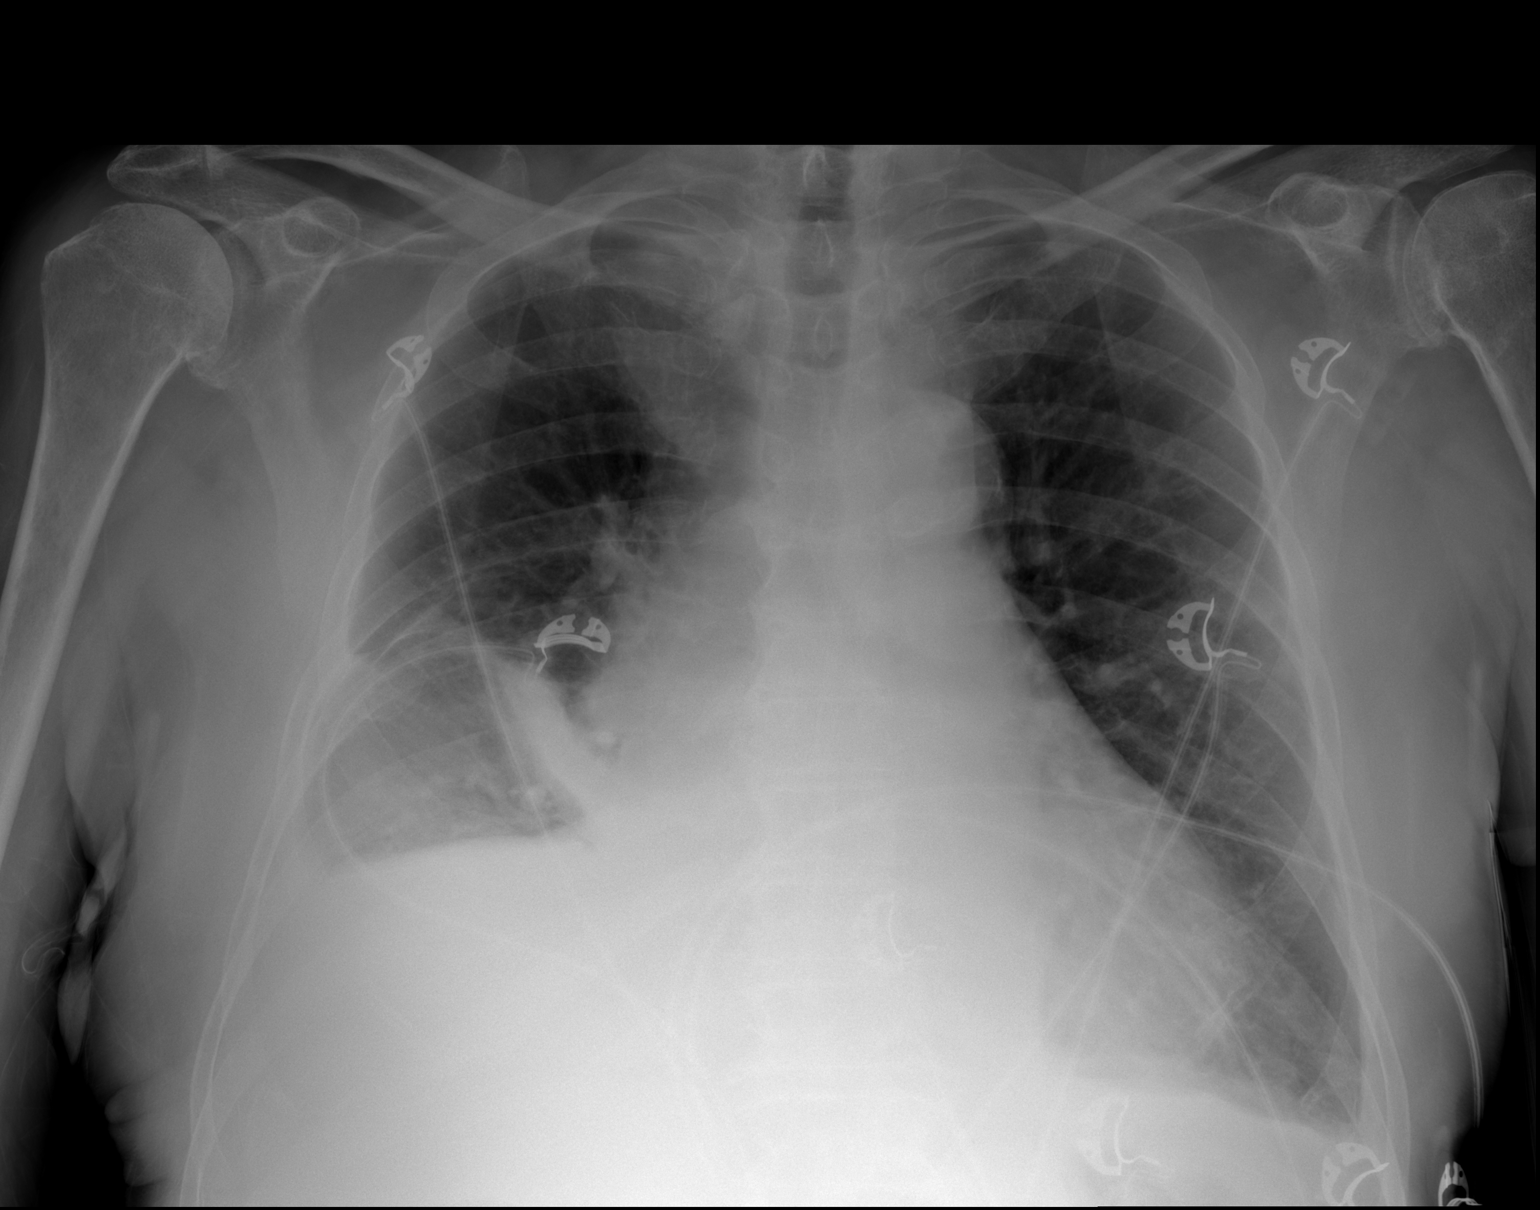

[w chest lat]
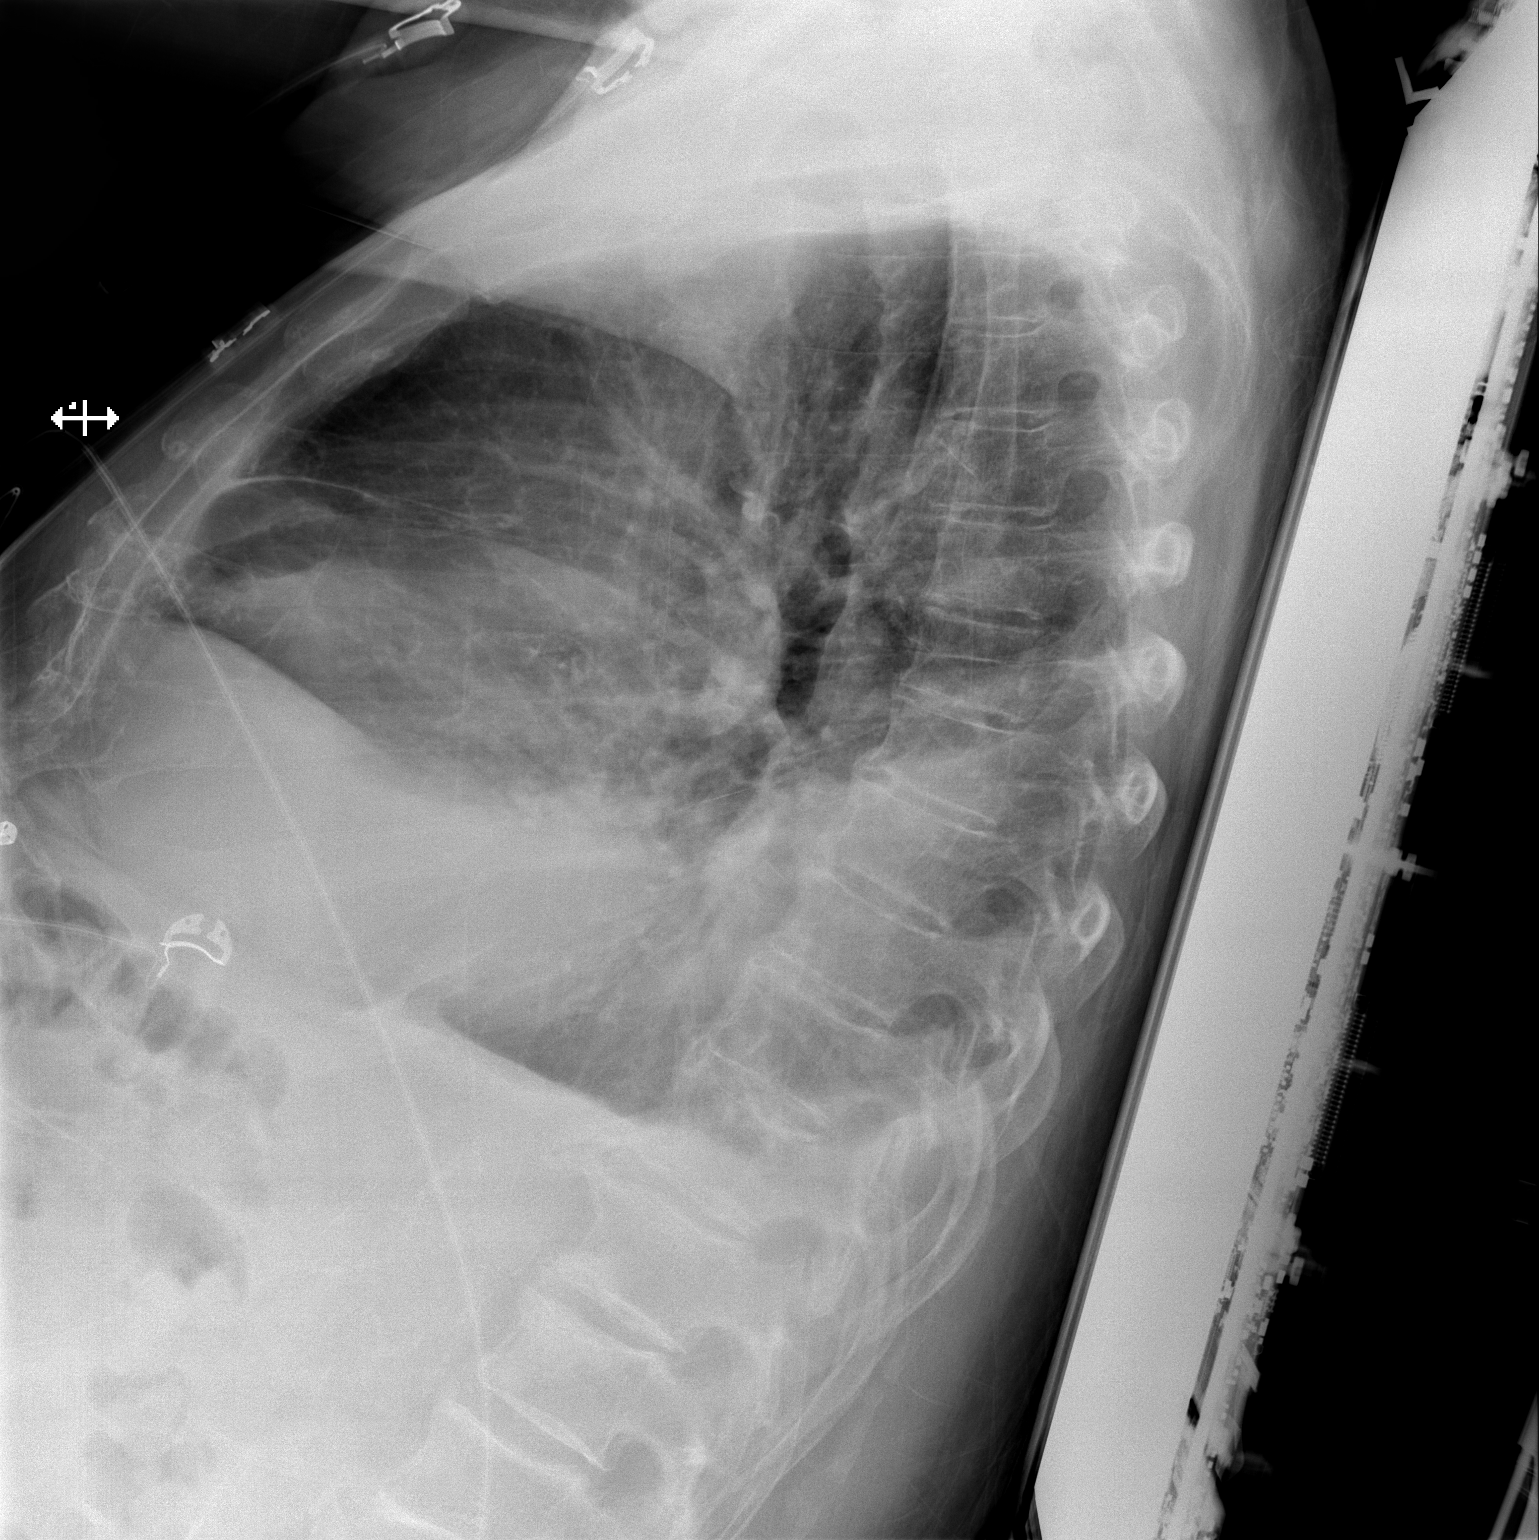

[2 of 2 positions shown; findings below may reference images not displayed]

FINDINGS: Moderate right and small left pleural effusions. There is additional
lung base opacity, also greater on the right, consistent with
atelectasis. Remainder of the lungs is clear.

No pneumothorax.

Cardiac silhouette is mildly enlarged. No mediastinal or hilar
masses.

Skeletal structures are grossly intact.
IMPRESSION: 1. Moderate right and small left pleural effusions with right
greater than left lung base atelectasis. No convincing pneumonia and
no evidence of pulmonary edema.
2. Mild cardiomegaly.

## 2021-12-15 IMAGING — CR DG HIP (WITH OR WITHOUT PELVIS) 2-3V*L*
2 series · 2 of 2 positions shown · non-contrast
Comparison: None.

CLINICAL DATA: Un witnessed fall.  Left hip pain.

EXAM:
DG HIP (WITH OR WITHOUT PELVIS) 2-3V LEFT

[x hip ap left]
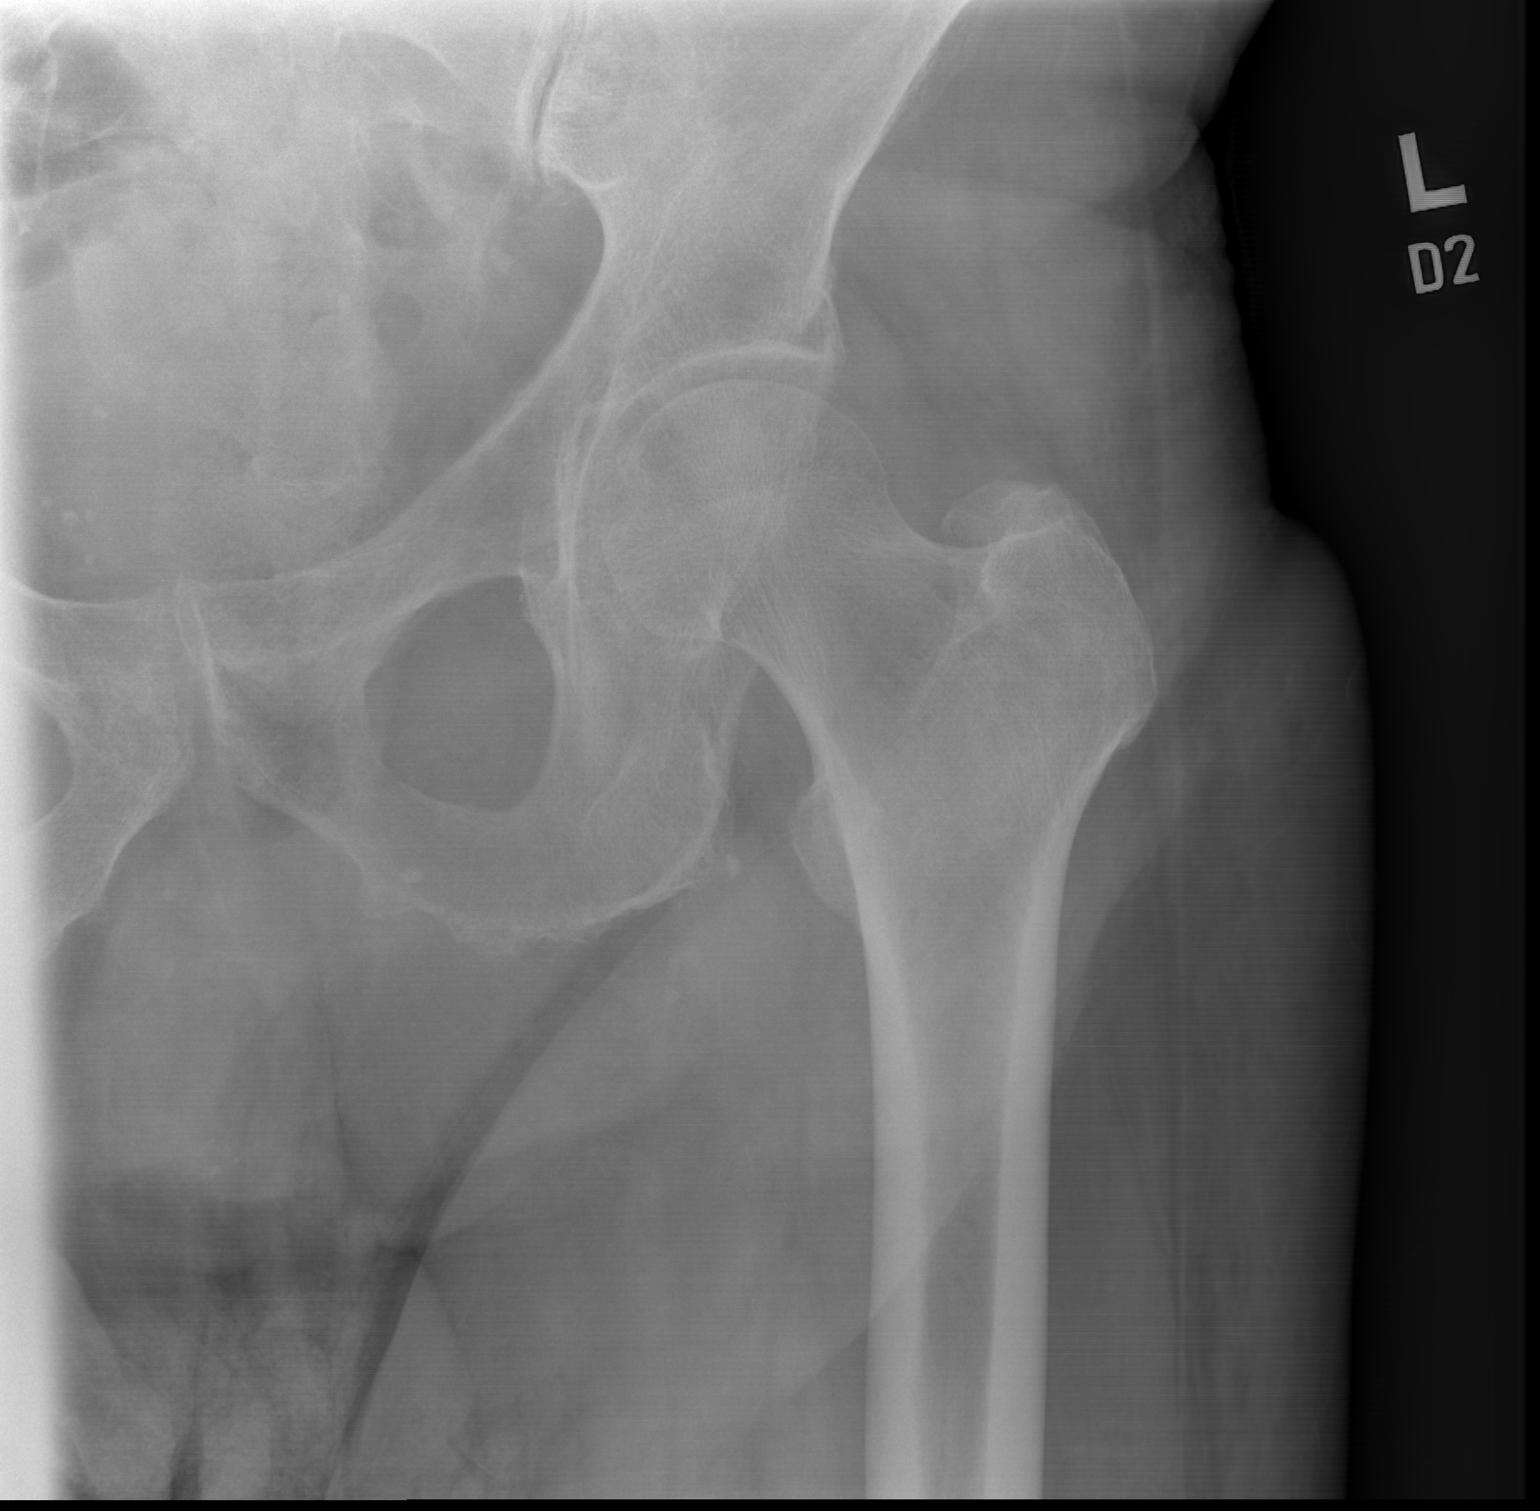

[x hip lat left]
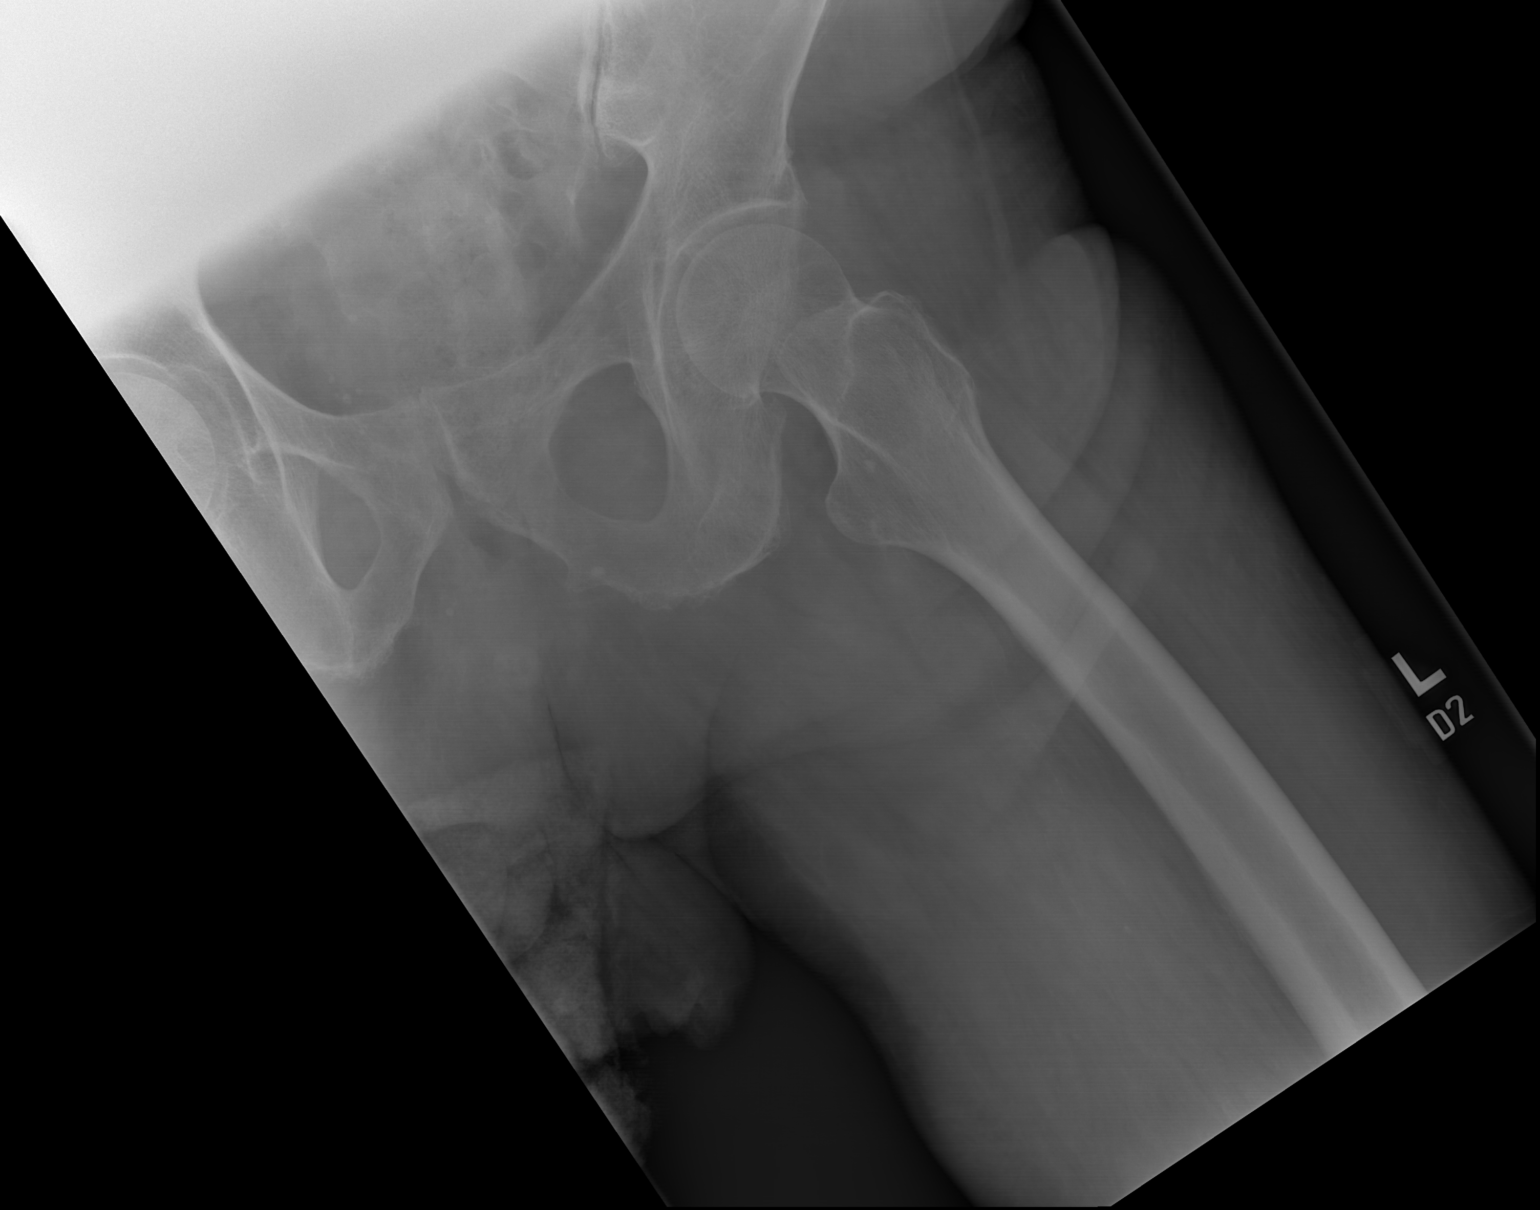

[2 of 2 positions shown; findings below may reference images not displayed]

FINDINGS: No fracture or bone lesion.

Hip joint is normally spaced and aligned.  No arthropathic changes.

Soft tissues are unremarkable.
IMPRESSION: Negative.

## 2022-04-22 ENCOUNTER — Other Ambulatory Visit: Payer: Self-pay | Admitting: Cardiology

## 2022-04-22 DIAGNOSIS — I4819 Other persistent atrial fibrillation: Secondary | ICD-10-CM

## 2022-04-22 NOTE — Telephone Encounter (Addendum)
Eliquis 2.5mg  refill request received. Patient is 88 years old, weight-71.2kg, Crea-1.24 on 06/24/2021, Diagnosis-Afib, and last seen by Dr. Mayford Knife on 10/01/2021 & Dr. Cristal Deer on 06/24/2021. Dose is inappropriate based on dosing criteria.    Per last OV: The 61-month prescription of Eliquis is too expensive for him now because he is in the donut hole. He would prefer a 88-month prescription. We reviewed criteria to continue reduced dose; his kidneys have improved from prior and he may need full dose.  Message to Lars Pinks D  Per Thayer Ohm PharmD recommendations: I would refill the 2.5 for 30 days and send a message to [facility] scheduling and see if he can get in with them.

## 2022-04-22 NOTE — Telephone Encounter (Signed)
Please review for refill. Thank you! 

## 2022-06-04 ENCOUNTER — Other Ambulatory Visit: Payer: Self-pay | Admitting: Cardiology

## 2022-06-04 DIAGNOSIS — I4819 Other persistent atrial fibrillation: Secondary | ICD-10-CM

## 2022-06-04 NOTE — Telephone Encounter (Signed)
Please review for refill. Thank you! 

## 2022-06-04 NOTE — Telephone Encounter (Signed)
Prescription refill request for Eliquis received. Indication: Afib  Last office visit: 06/24/21 Harrell Gave)  Scr: 1.24 (06/24/21)  Age: 89 Weight: 71.2kg  Dose is inappropriate based on dosing criteria. Will send to PharmD for review.

## 2022-06-07 NOTE — Telephone Encounter (Signed)
Please increase to 5mg  BID

## 2022-06-07 NOTE — Telephone Encounter (Signed)
Dose increased to 5mg  BID per Karren Cobble, PharmD.  Called and spoke with pt's wife. Made her aware of pt's dose change.  Refill sent.

## 2022-07-12 ENCOUNTER — Other Ambulatory Visit: Payer: Self-pay | Admitting: Cardiology

## 2022-07-12 DIAGNOSIS — I10 Essential (primary) hypertension: Secondary | ICD-10-CM

## 2022-07-12 NOTE — Telephone Encounter (Signed)
Please call pt to schedule overdue annual follow-up appointment with Dr. Harrell Gave for refills. Thank you!

## 2022-07-15 NOTE — Telephone Encounter (Signed)
Rx request sent to pharmacy.  

## 2022-07-15 NOTE — Telephone Encounter (Signed)
Scheduled 09/17/22 at 2:00 pm with Dr. Harrell Gave

## 2022-07-23 ENCOUNTER — Other Ambulatory Visit: Payer: Self-pay | Admitting: Cardiology

## 2022-07-23 DIAGNOSIS — I5042 Chronic combined systolic (congestive) and diastolic (congestive) heart failure: Secondary | ICD-10-CM

## 2022-07-23 NOTE — Telephone Encounter (Signed)
Rx(s) sent to pharmacy electronically.  

## 2022-08-24 ENCOUNTER — Other Ambulatory Visit: Payer: Self-pay | Admitting: Cardiology

## 2022-08-24 DIAGNOSIS — I5042 Chronic combined systolic (congestive) and diastolic (congestive) heart failure: Secondary | ICD-10-CM

## 2022-08-24 NOTE — Telephone Encounter (Signed)
Rx request sent to pharmacy.  

## 2022-09-03 ENCOUNTER — Other Ambulatory Visit: Payer: Self-pay | Admitting: Cardiology

## 2022-09-03 DIAGNOSIS — I4819 Other persistent atrial fibrillation: Secondary | ICD-10-CM

## 2022-09-03 DIAGNOSIS — I5042 Chronic combined systolic (congestive) and diastolic (congestive) heart failure: Secondary | ICD-10-CM

## 2022-09-03 NOTE — Telephone Encounter (Signed)
Rx request sent to pharmacy.  

## 2022-09-17 ENCOUNTER — Encounter (HOSPITAL_BASED_OUTPATIENT_CLINIC_OR_DEPARTMENT_OTHER): Payer: Self-pay | Admitting: Cardiology

## 2022-09-17 ENCOUNTER — Ambulatory Visit (INDEPENDENT_AMBULATORY_CARE_PROVIDER_SITE_OTHER): Payer: Medicare Other | Admitting: Cardiology

## 2022-09-17 VITALS — BP 124/62 | HR 82 | Ht 68.0 in | Wt 170.3 lb

## 2022-09-17 DIAGNOSIS — D6869 Other thrombophilia: Secondary | ICD-10-CM | POA: Diagnosis not present

## 2022-09-17 DIAGNOSIS — I4821 Permanent atrial fibrillation: Secondary | ICD-10-CM | POA: Diagnosis not present

## 2022-09-17 DIAGNOSIS — I10 Essential (primary) hypertension: Secondary | ICD-10-CM | POA: Insufficient documentation

## 2022-09-17 DIAGNOSIS — I5042 Chronic combined systolic (congestive) and diastolic (congestive) heart failure: Secondary | ICD-10-CM

## 2022-09-17 DIAGNOSIS — E7800 Pure hypercholesterolemia, unspecified: Secondary | ICD-10-CM

## 2022-09-17 DIAGNOSIS — Z7901 Long term (current) use of anticoagulants: Secondary | ICD-10-CM | POA: Diagnosis not present

## 2022-09-17 DIAGNOSIS — I872 Venous insufficiency (chronic) (peripheral): Secondary | ICD-10-CM | POA: Insufficient documentation

## 2022-09-17 DIAGNOSIS — Z79899 Other long term (current) drug therapy: Secondary | ICD-10-CM

## 2022-09-17 NOTE — Patient Instructions (Signed)
Medication Instructions:  The current medical regimen is effective;  continue present plan and medications.   *If you need a refill on your cardiac medications before your next appointment, please call your pharmacy*   Lab Work: CBC, CMET, Lipids  If you have labs (blood work) drawn today and your tests are completely normal, you will receive your results only by: MyChart Message (if you have MyChart) OR A paper copy in the mail If you have any lab test that is abnormal or we need to change your treatment, we will call you to review the results.   Testing/Procedures: None   Follow-Up: At [facility], you and your health needs are our priority.  As part of our continuing mission to provide you with exceptional heart care, we have created designated Provider Care Teams.  These Care Teams include your primary Cardiologist (physician) and Advanced Practice Providers (APPs -  Physician Assistants and Nurse Practitioners) who all work together to provide you with the care you need, when you need it.  We recommend signing up for the patient portal called "MyChart".  Sign up information is provided on this After Visit Summary.  MyChart is used to connect with patients for Virtual Visits (Telemedicine).  Patients are able to view lab/test results, encounter notes, upcoming appointments, etc.  Non-urgent messages can be sent to your provider as well.   To learn more about what you can do with MyChart, go to [email or web address].    Your next appointment:   1 year(s)  Provider:   Jodelle Red, MD    Other Instructions None

## 2022-09-17 NOTE — Progress Notes (Signed)
Cardiology Office Note:    Date:  09/17/2022   ID:  Michael Haas, DOB [birth year], MRN [id number]  PCP:  Pcp, No  Cardiologist:  Jodelle Red, MD  Referring MD: Barbie Banner, MD   CC: follow up  History of Present Illness:    Michael Haas is a 89 y.o. male with a hx of permanent atrial fibrillation, chronic systolic and diastolic heart failure with EF 45-50%, hypertension, hyperlipidemia, chronic venous stasis ulcers, and OSA on CPAP, who is seen for follow up today. I initially saw him 09/13/19 as a new consult at the request of Barbie Banner, MD for the evaluation and management of atrial fibrillation and heart failure.  At his last visit, he wasn't monitoring his weight, blood pressure, or oxygen level at home. Blood pressure was well controlled in the office. His wife noted he was having dyspneic episodes when sleeping. We did his STOP-BANG score and it was 7. Discussed sleep study, and they were amenable. His sleep study revealed severe OSA and he was recommended for CPAP.  Today, he is accompanied by his daughter. He complains of LE swelling, mostly in his left ankle and the top of his left foot. This swelling has been persistent. He denies associated soreness. No recent wounds or ulcers. He states that his swelling improves with elevation; however, his daughter believes the swelling remains unchanged in the mornings. They have not noticed much difference in his swelling if he doesn't take his furosemide. Also there was no significant change in his swelling after he was started on amlodipine last month. Currently he is wearing his compression socks. At night he usually sleeps flat in the bed, with his feet level on the bed.  He has been using his CPAP, and states that he is satisfied with it. His daughter reports that the mask may not always be on appropriately, and that that the machine may only be working properly a couple hours each night.  He denies any palpitations, chest  pain, lightheadedness, headaches, syncope, orthopnea, or PND.   Past Medical History:  Diagnosis Date   Hyperlipidemia    Hypertension     Past Surgical History:  Procedure Laterality Date   HERNIA REPAIR     left hand surgery     "It was caught in a machine years ago"    Current Medications: Current Outpatient Medications on File Prior to Visit  Medication Sig   amLODipine (NORVASC) 5 MG tablet Take 1 tablet (5 mg total) by mouth daily. Please keep your upcoming appointment for refills.   apixaban (ELIQUIS) 5 MG TABS tablet Take 1 tablet (5 mg total) by mouth 2 (two) times daily.   atorvastatin (LIPITOR) 40 MG tablet Take 1 tablet (40 mg total) by mouth daily. Please keep your upcoming appointment for refills.   furosemide (LASIX) 20 MG tablet TAKE 1 TABLET(20 MG) BY MOUTH DAILY   metoprolol succinate (TOPROL-XL) 100 MG 24 hr tablet Take 1 tablet (100 mg total) by mouth daily. Please keep your upcoming appointment for refills.   No current facility-administered medications on file prior to visit.     Allergies:   Patient has no known allergies.   Social History   Tobacco Use   Smoking status: Never   Smokeless tobacco: Former    Types: Chew    Quit date: 06/18/2019  Vaping Use   Vaping Use: Never used  Substance Use Topics   Alcohol use: Not Currently    Comment: last time  20 years ago   Drug use: Never    Family History: No history of premature cardiovascular disease  ROS:   Please see the history of present illness.   (+) LE edema L>R Additional pertinent ROS otherwise unremarkable.  EKGs/Labs/Other Studies Reviewed:    The following studies were reviewed today:  Echo 08/17/19 1. Left ventricular ejection fraction, by estimation, is 45 to 50%. The left ventricle has mildly decreased function. The left ventricle demonstrates global hypokinesis. There is mild concentric left ventricular hypertrophy. Left ventricular diastolic function could not be evaluated.    2. Right ventricular systolic function is normal. The right ventricular size is normal. There is mildly elevated pulmonary artery systolic pressure.   3. Left atrial size was mildly dilated.   4. Right atrial size was mildly dilated.   5. The mitral valve is normal in structure. No evidence of mitral valve regurgitation.   6. Tricuspid valve regurgitation is mild to moderate.   7. The aortic valve is tricuspid. Aortic valve regurgitation is not visualized. Mild aortic valve stenosis.   8. The inferior vena cava is normal in size with <50% respiratory variability, suggesting right atrial pressure of 8 mmHg.   EKG:  EKG is personally reviewed.   09/17/2022:  atrial fibrillation at 82 bpm, rbbb 06/24/2021: atrial fibrillation, 85 bpm, RBBB 10/17/2020: Atrial fibrillation, rate 97 bpm, RBBB, PVC vs aberrancy 03/28/20: atrial fibrillation, rate 84 bpm, RBBB, PVC  Recent Labs: No results found for requested labs within last 365 days.   Recent Lipid Panel No results found for: "CHOL", "TRIG", "HDL", "CHOLHDL", "VLDL", "LDLCALC", "LDLDIRECT"  Physical Exam:    VS:  BP 124/62 (BP Location: Right Arm, Patient Position: Sitting, Cuff Size: Normal)   Pulse 82   Ht 5\' 8"  (1.727 m)   Wt 170 lb 4.8 oz (77.2 kg)   BMI 25.89 kg/m     Wt Readings from Last 3 Encounters:  09/17/22 170 lb 4.8 oz (77.2 kg)  10/01/21 157 lb (71.2 kg)  07/01/21 159 lb (72.1 kg)    GEN: Well nourished, well developed in no acute distress HEENT: Normal, moist mucous membranes NECK: No JVD CARDIAC: irregularly irregular rhythm, normal S1 and S2, no rubs or gallops. No murmur. VASCULAR: Radial and DP pulses 2+ bilaterally. No carotid bruits RESPIRATORY:  Clear to auscultation without rales, wheezing or rhonchi  ABDOMEN: Soft, non-tender, non-distended MUSCULOSKELETAL:  Ambulates independently with rollator walker SKIN: Warm and dry, 1+ Left LE edema NEUROLOGIC:  Alert and oriented x 3. No focal neuro deficits  noted. PSYCHIATRIC:  Normal affect    ASSESSMENT:    1. Chronic combined systolic and diastolic heart failure (HCC)   2. Permanent atrial fibrillation (HCC)   3. Secondary hypercoagulable state (HCC)   4. Long term current use of anticoagulant   5. Essential hypertension   6. Chronic venous insufficiency   7. Medication management   8. Pure hypercholesterolemia     PLAN:    Atrial fibrillation: permanent -CHA2DS2/VAS Stroke Risk Points=4 -tolerating apixaban, does not meet reduced dosing criteria at this time -They would be interested in dabigatran when the generic becomes available. -continue metoprolol succinate  Chronic systolic and diastolic heart failure:  -EF 45-50% 08/2019 -NYHA class II (limited exertion for assessment) -we have discussed ischemic evaluation. Declines. -on furosemide and metoprolol succinate -has mild edema today. Will check labs -Discussed arb again today, worried about kidneys, wants to keep with amlodipine. Stopping amlodipine didn't change his swelling  Hypertension, with lability (hypotension  as well intermittently) -well controlled today -see prior discussion re: ARBs -discussed salt avoidance -amlodipine would be the first medication to stop if low  Cardiac risk counseling and prevention recommendations: -recommend heart healthy/Mediterranean diet, with whole grains, fruits, vegetable, fish, lean meats, nuts, and olive oil. Limit salt. -recommend moderate walking, 3-5 times/week for 30-50 minutes each session. Aim for at least 150 minutes.week. Goal should be pace of 3 miles/hours, or walking 1.5 miles in 30 minutes -recommend avoidance of tobacco products. Avoid excess alcohol.  Plan for follow up: 1 year, or sooner as needed.  Jodelle Red, MD, PhD, [facility]  [facility]    I,Mathew Stumpf,acting as a scribe for Jodelle Red, MD.,have documented all relevant documentation on the behalf of Jodelle Red, MD,as directed by  Jodelle Red, MD while in the presence of Jodelle Red, MD.  I, Jodelle Red, MD, have reviewed all documentation for this visit. The documentation on 09/17/22 for the exam, diagnosis, procedures, and orders are all accurate and complete.    Signed, Jodelle Red, MD PhD 09/17/2022  [facility]

## 2022-09-18 LAB — COMPREHENSIVE METABOLIC PANEL
ALT: 20 IU/L (ref 0–44)
AST: 21 IU/L (ref 0–40)
Albumin/Globulin Ratio: 1.8 (ref 1.2–2.2)
Albumin: 4.2 g/dL (ref 3.6–4.6)
Alkaline Phosphatase: 108 IU/L (ref 44–121)
BUN/Creatinine Ratio: 18 (ref 10–24)
BUN: 21 mg/dL (ref 10–36)
Bilirubin Total: 0.6 mg/dL (ref 0.0–1.2)
CO2: 25 mmol/L (ref 20–29)
Calcium: 9.4 mg/dL (ref 8.6–10.2)
Chloride: 105 mmol/L (ref 96–106)
Creatinine, Ser: 1.2 mg/dL (ref 0.76–1.27)
Globulin, Total: 2.4 g/dL (ref 1.5–4.5)
Glucose: 91 mg/dL (ref 70–99)
Potassium: 4.9 mmol/L (ref 3.5–5.2)
Sodium: 143 mmol/L (ref 134–144)
Total Protein: 6.6 g/dL (ref 6.0–8.5)
eGFR: 57 mL/min/{1.73_m2} — ABNORMAL LOW (ref >59)

## 2022-09-18 LAB — CBC
Hematocrit: 43 % (ref 37.5–51.0)
Hemoglobin: 14.3 g/dL (ref 13.0–17.7)
MCH: 32.9 pg (ref 26.6–33.0)
MCHC: 33.3 g/dL (ref 31.5–35.7)
MCV: 99 fL — ABNORMAL HIGH (ref 79–97)
Platelets: 378 10*3/uL (ref 150–450)
RBC: 4.35 x10E6/uL (ref 4.14–5.80)
RDW: 12.9 % (ref 11.6–15.4)
WBC: 13.1 10*3/uL — ABNORMAL HIGH (ref 3.4–10.8)

## 2022-09-18 LAB — LIPID PANEL
Chol/HDL Ratio: 3.1 ratio (ref 0.0–5.0)
Cholesterol, Total: 120 mg/dL (ref 100–199)
HDL: 39 mg/dL — ABNORMAL LOW (ref >39)
LDL Chol Calc (NIH): 59 mg/dL (ref 0–99)
Triglycerides: 120 mg/dL (ref 0–149)
VLDL Cholesterol Cal: 22 mg/dL (ref 5–40)

## 2022-09-22 ENCOUNTER — Other Ambulatory Visit: Payer: Self-pay | Admitting: Cardiology

## 2022-09-22 DIAGNOSIS — I5042 Chronic combined systolic (congestive) and diastolic (congestive) heart failure: Secondary | ICD-10-CM

## 2022-09-22 NOTE — Telephone Encounter (Signed)
Rx request sent to pharmacy.  

## 2022-10-14 ENCOUNTER — Other Ambulatory Visit: Payer: Self-pay | Admitting: Cardiology

## 2022-10-14 DIAGNOSIS — I10 Essential (primary) hypertension: Secondary | ICD-10-CM

## 2022-10-14 NOTE — Telephone Encounter (Signed)
Rx request sent to pharmacy.  

## 2022-12-01 ENCOUNTER — Other Ambulatory Visit: Payer: Self-pay | Admitting: Cardiology

## 2022-12-01 DIAGNOSIS — I5042 Chronic combined systolic (congestive) and diastolic (congestive) heart failure: Secondary | ICD-10-CM

## 2022-12-01 DIAGNOSIS — I4819 Other persistent atrial fibrillation: Secondary | ICD-10-CM

## 2022-12-20 ENCOUNTER — Other Ambulatory Visit: Payer: Self-pay | Admitting: Cardiology

## 2022-12-20 DIAGNOSIS — I4819 Other persistent atrial fibrillation: Secondary | ICD-10-CM

## 2022-12-20 NOTE — Telephone Encounter (Signed)
Please review for refill. Thank you! 

## 2022-12-20 NOTE — Telephone Encounter (Signed)
Prescription refill request for Eliquis received. Indication:afib Last office visit:5/24 Scr:1.20  5/24 Age: 89 Weight:77.2  kg  Prescription refilled

## 2023-03-22 ENCOUNTER — Other Ambulatory Visit: Payer: Self-pay | Admitting: Cardiology

## 2023-03-22 DIAGNOSIS — I5042 Chronic combined systolic (congestive) and diastolic (congestive) heart failure: Secondary | ICD-10-CM

## 2023-06-23 ENCOUNTER — Other Ambulatory Visit: Payer: Self-pay | Admitting: Cardiology

## 2023-06-23 DIAGNOSIS — I5042 Chronic combined systolic (congestive) and diastolic (congestive) heart failure: Secondary | ICD-10-CM

## 2023-07-19 ENCOUNTER — Other Ambulatory Visit: Payer: Self-pay | Admitting: Cardiology

## 2023-07-19 DIAGNOSIS — I4819 Other persistent atrial fibrillation: Secondary | ICD-10-CM

## 2023-07-20 NOTE — Telephone Encounter (Signed)
 Eliquis 5mg  refill request received. Patient is 89 years old, weight-77.2kg, Crea-1.20 on 09/17/22, Diagnosis-Afib, and last seen by Dr. Cristal Deer on 09/17/22. Dose is appropriate based on dosing criteria. Will send in refill to requested pharmacy.

## 2023-09-02 ENCOUNTER — Other Ambulatory Visit: Payer: Self-pay | Admitting: Cardiology

## 2023-09-02 DIAGNOSIS — I4819 Other persistent atrial fibrillation: Secondary | ICD-10-CM

## 2023-09-02 DIAGNOSIS — I5042 Chronic combined systolic (congestive) and diastolic (congestive) heart failure: Secondary | ICD-10-CM

## 2023-10-01 ENCOUNTER — Other Ambulatory Visit: Payer: Self-pay | Admitting: Cardiology

## 2023-10-01 DIAGNOSIS — I5042 Chronic combined systolic (congestive) and diastolic (congestive) heart failure: Secondary | ICD-10-CM

## 2023-10-24 ENCOUNTER — Other Ambulatory Visit: Payer: Self-pay | Admitting: Cardiology

## 2023-10-24 DIAGNOSIS — I10 Essential (primary) hypertension: Secondary | ICD-10-CM

## 2023-11-23 ENCOUNTER — Encounter (HOSPITAL_BASED_OUTPATIENT_CLINIC_OR_DEPARTMENT_OTHER): Payer: Self-pay | Admitting: Cardiology

## 2023-11-23 ENCOUNTER — Ambulatory Visit (HOSPITAL_BASED_OUTPATIENT_CLINIC_OR_DEPARTMENT_OTHER): Admitting: Cardiology

## 2023-11-23 VITALS — BP 120/60 | HR 90 | Ht 68.0 in | Wt 158.0 lb

## 2023-11-23 DIAGNOSIS — I5042 Chronic combined systolic (congestive) and diastolic (congestive) heart failure: Secondary | ICD-10-CM | POA: Diagnosis not present

## 2023-11-23 DIAGNOSIS — Z7901 Long term (current) use of anticoagulants: Secondary | ICD-10-CM

## 2023-11-23 DIAGNOSIS — I872 Venous insufficiency (chronic) (peripheral): Secondary | ICD-10-CM

## 2023-11-23 DIAGNOSIS — I10 Essential (primary) hypertension: Secondary | ICD-10-CM | POA: Diagnosis not present

## 2023-11-23 DIAGNOSIS — I4821 Permanent atrial fibrillation: Secondary | ICD-10-CM

## 2023-11-23 DIAGNOSIS — D6869 Other thrombophilia: Secondary | ICD-10-CM

## 2023-11-23 MED ORDER — FUROSEMIDE 40 MG PO TABS: 40.0000 mg | ORAL_TABLET | Freq: Every day | ORAL | 3 refills | Status: AC

## 2023-11-23 NOTE — Patient Instructions (Addendum)
 Medication Instructions:  RESUME FUROSEMIDE  40 MG DAILY   *If you need a refill on your cardiac medications before your next appointment, please call your pharmacy*  Lab Work: CBC/BMET TODAY   If you have labs (blood work) drawn today and your tests are completely normal, you will receive your results only by: MyChart Message (if you have MyChart) OR A paper copy in the mail If you have any lab test that is abnormal or we need to change your treatment, we will call you to review the results.  Testing/Procedures: NONE  Follow-Up: 12/15/2023 11:40 AM WITH DR LONNI   Other Instructions GO UPSTAIRS AND GET APPOINTMENT WITH PRIMARY CARE

## 2023-11-23 NOTE — Progress Notes (Signed)
 Cardiology Office Note:  .   Date:  11/23/2023  ID:  Michael Haas, DOB 01-May-1929, MRN [id number] PCP: Pcp, No   [facility] Providers Cardiologist:  Shelda Bruckner, MD Sleep Medicine:  Wilbert Bihari, MD {  History of Present Illness: .   Michael Haas is a 89 y.o. male with a hx of permanent atrial fibrillation, chronic systolic and diastolic heart failure with EF 45-50%, hypertension, hyperlipidemia, chronic venous stasis ulcers who is seen for follow up today. I initially saw him 09/13/19 as a new consult at the request of Tanda Prentice DEL, MD for the evaluation and management of atrial fibrillation and heart failure.  Today: He has been having frequent falls. Wife is present, very stressed with caring for him. Sleeps most of the day, stopped using his CPAP about a year ago. She has to check on him to wake him up to make sure he is breathing. He stopped taking fluid pill months ago, wife notes that left foot is so swollen chronically that its difficult to get in a shoe. He feels that the diuretics made him go to the bathroom too much, and that the breathing machine made him feel short of breath and congested. Denies recents sores/ulcers on legs.  Wife notes that even without the fluid pill, he urinates all day and all night long. Has incomplete emptying. Does not have PCP currently, has never been told that he has prostate issues.  Hasn't noted weight gain. Pant size is down from a 36 to a 34 in the last few month. States eating/drinking is unchanged except for a time where he had a GI illness and couldn't eat for 4-5 days.   Patient denies hitting his head with falls, wife thinks he has hit his head. Reviewed that if he hits his head he needs to come to ER to get CT done.  ROS: Denies chest pain, shortness of breath at rest or with normal exertion. No PND, orthopnea, or unexpected weight gain. No syncope or palpitations. ROS otherwise negative except as noted.   Studies Reviewed:  SABRA    EKG:  EKG Interpretation Date/Time:  Wednesday November 23 2023 12:01:48 EDT Ventricular Rate:  84 PR Interval:    QRS Duration:  138 QT Interval:  424 QTC Calculation: 501 R Axis:   -88  Text Interpretation: Atrial fibrillation with premature ventricular or aberrantly conducted complexes Right bundle branch block Left anterior fascicular block Bifascicular block When compared with ECG of 16-Aug-2019 11:04, PREVIOUS ECG IS PRESENT Confirmed by Bruckner Shelda [phone]) on 11/23/2023 12:13:01 PM    Physical Exam:   VS:  BP 120/60 (BP Location: Left Arm, Patient Position: Sitting, Cuff Size: Normal)   Pulse 90   Ht 5' 8 (1.727 m)   Wt 158 lb (71.7 kg)   BMI 24.02 kg/m    Wt Readings from Last 3 Encounters:  11/23/23 158 lb (71.7 kg)  09/17/22 170 lb 4.8 oz (77.2 kg)  10/01/21 157 lb (71.2 kg)    GEN: Well nourished, well developed in no acute distress HEENT: Normal, moist mucous membranes NECK: No JVD CARDIAC: irregularly irregular rhythm, normal S1 and S2, no rubs or gallops. No murmur. VASCULAR: Radial pulses 2+ bilaterally. LE warm, cannot palpate pulses through edema RESPIRATORY:  Clear to auscultation without rales, wheezing or rhonchi  ABDOMEN: Soft, non-tender, non-distended MUSCULOSKELETAL:  Ambulates independently SKIN: Warm and dry, bilateral 1+ pitting LE edema L>R NEUROLOGIC:  Alert and oriented x 3. No focal neuro deficits noted.  PSYCHIATRIC:  Normal affect    ASSESSMENT AND PLAN: .    Atrial fibrillation: permanent -CHA2DS2/VAS Stroke Risk Points=4 -continue apixaban  5 mg BID, last Cr 1.2 09/2022. Recheck BMET and CBC today. Endorses frequent falls, disagreement between patient and wife how frequent, whether he hits his head. Discussed keeping track of this, will need to discuss risk of falls vs risk of stroke if falls are indeed frequent at home -continue metoprolol  succinate   Chronic systolic and diastolic heart failure LE edema Chronic venous  insufficiency -EF 45-50% 08/2019 -limited exertion to determine NYHA class -we have discussed ischemic evaluation. Declines. -on metoprolol  succinate -stopped taking lasix  as he felt like it makes him urinate too much, but wife notes he is still urinating frequently. Does note some incomplete emptying, has never been told he has prostate issues -he is willing to retry lasix , take in the AM to prevent increased urination late in the day -we have discussed ARB in the past, declined.   OSA -no longer on CPAP per patient preference, wife very concerned about this. He will consider   Hypertension: -well controlled today -discussed salt avoidance -denies lightheadedness, continue amlodipine  for now, but if BP decreases with restarting furosemide , would have low threshold to stop this  He does not have a PCP. Will refer him to [facility] PCP group to establish.  Dispo: 4 weeks to follow up swelling and blood pressure  Signed, Shelda Bruckner, MD   Shelda Bruckner, MD, PhD, [facility]  [facility]  [place]  [facility] at [facility] at [facility] [address] [place], [place] [address] [phone]

## 2023-11-24 LAB — BASIC METABOLIC PANEL WITH GFR
BUN/Creatinine Ratio: 14 (ref 10–24)
BUN: 16 mg/dL (ref 10–36)
CO2: 21 mmol/L (ref 20–29)
Calcium: 9.4 mg/dL (ref 8.6–10.2)
Chloride: 103 mmol/L (ref 96–106)
Creatinine, Ser: 1.14 mg/dL (ref 0.76–1.27)
Glucose: 91 mg/dL (ref 70–99)
Potassium: 4.8 mmol/L (ref 3.5–5.2)
Sodium: 141 mmol/L (ref 134–144)
eGFR: 60 mL/min/1.73 (ref >59)

## 2023-11-24 LAB — CBC WITH DIFFERENTIAL/PLATELET
Basophils Absolute: 0.1 x10E3/uL (ref 0.0–0.2)
Basos: 1 %
EOS (ABSOLUTE): 0.5 x10E3/uL — ABNORMAL HIGH (ref 0.0–0.4)
Eos: 3 %
Hematocrit: 44.3 % (ref 37.5–51.0)
Hemoglobin: 14.1 g/dL (ref 13.0–17.7)
Immature Grans (Abs): 0.1 x10E3/uL (ref 0.0–0.1)
Immature Granulocytes: 1 %
Lymphocytes Absolute: 1.1 x10E3/uL (ref 0.7–3.1)
Lymphs: 7 %
MCH: 33.2 pg — ABNORMAL HIGH (ref 26.6–33.0)
MCHC: 31.8 g/dL (ref 31.5–35.7)
MCV: 104 fL — ABNORMAL HIGH (ref 79–97)
Monocytes Absolute: 1 x10E3/uL — ABNORMAL HIGH (ref 0.1–0.9)
Monocytes: 6 %
Neutrophils Absolute: 13.7 x10E3/uL — ABNORMAL HIGH (ref 1.4–7.0)
Neutrophils: 82 %
Platelets: 506 x10E3/uL — ABNORMAL HIGH (ref 150–450)
RBC: 4.25 x10E6/uL (ref 4.14–5.80)
RDW: 14.1 % (ref 11.6–15.4)
WBC: 16.5 x10E3/uL — ABNORMAL HIGH (ref 3.4–10.8)

## 2023-11-28 ENCOUNTER — Other Ambulatory Visit: Payer: Self-pay | Admitting: Cardiology

## 2023-11-28 DIAGNOSIS — I10 Essential (primary) hypertension: Secondary | ICD-10-CM

## 2023-12-15 ENCOUNTER — Ambulatory Visit (HOSPITAL_BASED_OUTPATIENT_CLINIC_OR_DEPARTMENT_OTHER): Admitting: Cardiology

## 2023-12-21 ENCOUNTER — Ambulatory Visit (HOSPITAL_BASED_OUTPATIENT_CLINIC_OR_DEPARTMENT_OTHER): Payer: Self-pay | Admitting: Cardiology

## 2024-03-17 ENCOUNTER — Other Ambulatory Visit: Payer: Self-pay | Admitting: Cardiology

## 2024-03-17 DIAGNOSIS — I5042 Chronic combined systolic (congestive) and diastolic (congestive) heart failure: Secondary | ICD-10-CM

## 2024-03-17 DIAGNOSIS — I4819 Other persistent atrial fibrillation: Secondary | ICD-10-CM

## 2024-06-05 ENCOUNTER — Emergency Department (HOSPITAL_COMMUNITY)

## 2024-06-05 ENCOUNTER — Inpatient Hospital Stay (HOSPITAL_COMMUNITY)
Admission: EM | Admit: 2024-06-05 | Discharge: 2024-06-11 | DRG: 291 | Disposition: A | Discharge status: Skilled Nursing Facility | Attending: Internal Medicine | Admitting: Internal Medicine

## 2024-06-05 ENCOUNTER — Other Ambulatory Visit: Payer: Self-pay

## 2024-06-05 ENCOUNTER — Encounter (HOSPITAL_COMMUNITY): Payer: Self-pay

## 2024-06-05 DIAGNOSIS — R188 Other ascites: Secondary | ICD-10-CM | POA: Diagnosis present

## 2024-06-05 DIAGNOSIS — R4189 Other symptoms and signs involving cognitive functions and awareness: Secondary | ICD-10-CM | POA: Diagnosis present

## 2024-06-05 DIAGNOSIS — Z79899 Other long term (current) drug therapy: Secondary | ICD-10-CM | POA: Diagnosis not present

## 2024-06-05 DIAGNOSIS — E785 Hyperlipidemia, unspecified: Secondary | ICD-10-CM | POA: Diagnosis present

## 2024-06-05 DIAGNOSIS — N401 Enlarged prostate with lower urinary tract symptoms: Secondary | ICD-10-CM | POA: Diagnosis present

## 2024-06-05 DIAGNOSIS — I272 Pulmonary hypertension, unspecified: Secondary | ICD-10-CM | POA: Diagnosis present

## 2024-06-05 DIAGNOSIS — Z91148 Patient's other noncompliance with medication regimen for other reason: Secondary | ICD-10-CM | POA: Diagnosis not present

## 2024-06-05 DIAGNOSIS — G9340 Encephalopathy, unspecified: Secondary | ICD-10-CM | POA: Diagnosis present

## 2024-06-05 DIAGNOSIS — I4821 Permanent atrial fibrillation: Secondary | ICD-10-CM | POA: Diagnosis present

## 2024-06-05 DIAGNOSIS — R680 Hypothermia, not associated with low environmental temperature: Secondary | ICD-10-CM | POA: Diagnosis present

## 2024-06-05 DIAGNOSIS — Z66 Do not resuscitate: Secondary | ICD-10-CM | POA: Diagnosis present

## 2024-06-05 DIAGNOSIS — I110 Hypertensive heart disease with heart failure: Principal | ICD-10-CM | POA: Diagnosis present

## 2024-06-05 DIAGNOSIS — I872 Venous insufficiency (chronic) (peripheral): Secondary | ICD-10-CM | POA: Diagnosis present

## 2024-06-05 DIAGNOSIS — I4819 Other persistent atrial fibrillation: Secondary | ICD-10-CM

## 2024-06-05 DIAGNOSIS — I10 Essential (primary) hypertension: Secondary | ICD-10-CM | POA: Diagnosis present

## 2024-06-05 DIAGNOSIS — G9341 Metabolic encephalopathy: Secondary | ICD-10-CM | POA: Diagnosis present

## 2024-06-05 DIAGNOSIS — I5042 Chronic combined systolic (congestive) and diastolic (congestive) heart failure: Secondary | ICD-10-CM

## 2024-06-05 DIAGNOSIS — Z7901 Long term (current) use of anticoagulants: Secondary | ICD-10-CM | POA: Diagnosis not present

## 2024-06-05 DIAGNOSIS — I5043 Acute on chronic combined systolic (congestive) and diastolic (congestive) heart failure: Secondary | ICD-10-CM | POA: Diagnosis present

## 2024-06-05 DIAGNOSIS — I509 Heart failure, unspecified: Secondary | ICD-10-CM

## 2024-06-05 DIAGNOSIS — Z87891 Personal history of nicotine dependence: Secondary | ICD-10-CM | POA: Diagnosis not present

## 2024-06-05 DIAGNOSIS — R338 Other retention of urine: Secondary | ICD-10-CM | POA: Diagnosis present

## 2024-06-05 DIAGNOSIS — G4733 Obstructive sleep apnea (adult) (pediatric): Secondary | ICD-10-CM | POA: Diagnosis present

## 2024-06-05 DIAGNOSIS — T68XXXA Hypothermia, initial encounter: Secondary | ICD-10-CM | POA: Diagnosis present

## 2024-06-05 DIAGNOSIS — R4182 Altered mental status, unspecified: Principal | ICD-10-CM

## 2024-06-05 HISTORY — DX: Heart failure, unspecified: I50.9

## 2024-06-05 LAB — URINALYSIS, ROUTINE W REFLEX MICROSCOPIC
Bilirubin Urine: NEGATIVE
Glucose, UA: NEGATIVE mg/dL
Hgb urine dipstick: NEGATIVE
Ketones, ur: NEGATIVE mg/dL
Leukocytes,Ua: NEGATIVE
Nitrite: NEGATIVE
Protein, ur: 100 mg/dL — AB
Specific Gravity, Urine: 1.02 (ref 1.005–1.030)
pH: 5 (ref 5.0–8.0)

## 2024-06-05 LAB — CBC
HCT: 44.4 % (ref 39.0–52.0)
Hemoglobin: 14.5 g/dL (ref 13.0–17.0)
MCH: 33.7 pg (ref 26.0–34.0)
MCHC: 32.7 g/dL (ref 30.0–36.0)
MCV: 103.3 fL — ABNORMAL HIGH (ref 80.0–100.0)
Platelets: 413 10*3/uL — ABNORMAL HIGH (ref 150–400)
RBC: 4.3 MIL/uL (ref 4.22–5.81)
RDW: 17.2 % — ABNORMAL HIGH (ref 11.5–15.5)
WBC: 17.4 10*3/uL — ABNORMAL HIGH (ref 4.0–10.5)
nRBC: 0 % (ref 0.0–0.2)

## 2024-06-05 LAB — I-STAT CHEM 8, ED
BUN: 37 mg/dL — ABNORMAL HIGH (ref 8–23)
Calcium, Ion: 1.11 mmol/L — ABNORMAL LOW (ref 1.15–1.40)
Chloride: 108 mmol/L (ref 98–111)
Creatinine, Ser: 1.5 mg/dL — ABNORMAL HIGH (ref 0.61–1.24)
Glucose, Bld: 110 mg/dL — ABNORMAL HIGH (ref 70–99)
HCT: 45 % (ref 39.0–52.0)
Hemoglobin: 15.3 g/dL (ref 13.0–17.0)
Potassium: 4.2 mmol/L (ref 3.5–5.1)
Sodium: 146 mmol/L — ABNORMAL HIGH (ref 135–145)
TCO2: 26 mmol/L (ref 22–32)

## 2024-06-05 LAB — COMPREHENSIVE METABOLIC PANEL WITH GFR
ALT: 27 U/L (ref 0–44)
AST: 41 U/L (ref 15–41)
Albumin: 3.8 g/dL (ref 3.5–5.0)
Alkaline Phosphatase: 127 U/L — ABNORMAL HIGH (ref 38–126)
Anion gap: 12 (ref 5–15)
BUN: 34 mg/dL — ABNORMAL HIGH (ref 8–23)
CO2: 26 mmol/L (ref 22–32)
Calcium: 9.3 mg/dL (ref 8.9–10.3)
Chloride: 108 mmol/L (ref 98–111)
Creatinine, Ser: 1.32 mg/dL — ABNORMAL HIGH (ref 0.61–1.24)
GFR, Estimated: 50 mL/min — ABNORMAL LOW
Glucose, Bld: 112 mg/dL — ABNORMAL HIGH (ref 70–99)
Potassium: 4.6 mmol/L (ref 3.5–5.1)
Sodium: 146 mmol/L — ABNORMAL HIGH (ref 135–145)
Total Bilirubin: 0.6 mg/dL (ref 0.0–1.2)
Total Protein: 6.7 g/dL (ref 6.5–8.1)

## 2024-06-05 LAB — T4, FREE: Free T4: 1.31 ng/dL (ref 0.80–2.00)

## 2024-06-05 LAB — RESP PANEL BY RT-PCR (RSV, FLU A&B, COVID)  RVPGX2
Influenza A by PCR: NEGATIVE
Influenza B by PCR: NEGATIVE
Resp Syncytial Virus by PCR: NEGATIVE
SARS Coronavirus 2 by RT PCR: NEGATIVE

## 2024-06-05 LAB — I-STAT CG4 LACTIC ACID, ED
Lactic Acid, Venous: 1.6 mmol/L (ref 0.5–1.9)
Lactic Acid, Venous: 1.3 mmol/L (ref 0.5–1.9)

## 2024-06-05 LAB — PRO BRAIN NATRIURETIC PEPTIDE: Pro Brain Natriuretic Peptide: 1474 pg/mL — ABNORMAL HIGH

## 2024-06-05 LAB — TROPONIN T, HIGH SENSITIVITY
Troponin T High Sensitivity: 51 ng/L — ABNORMAL HIGH (ref 0–19)
Troponin T High Sensitivity: 49 ng/L — ABNORMAL HIGH (ref 0–19)

## 2024-06-05 MED ORDER — SODIUM CHLORIDE 0.9% FLUSH
3.0000 mL | Freq: Two times a day (BID) | INTRAVENOUS | Status: DC
  Administered 2024-06-05 – 2024-06-11 (×12): 3 mL via INTRAVENOUS

## 2024-06-05 MED ORDER — IOHEXOL 350 MG/ML SOLN
75.0000 mL | Freq: Once | INTRAVENOUS | Status: AC | PRN
  Administered 2024-06-05: 75 mL via INTRAVENOUS

## 2024-06-05 MED ORDER — ACETAMINOPHEN 650 MG RE SUPP: 650.0000 mg | Freq: Four times a day (QID) | RECTAL | Status: DC | PRN

## 2024-06-05 MED ORDER — ONDANSETRON HCL 4 MG/2ML IJ SOLN: 4.0000 mg | Freq: Four times a day (QID) | INTRAMUSCULAR | Status: DC | PRN

## 2024-06-05 MED ORDER — VANCOMYCIN HCL 1500 MG/300ML IV SOLN
1500.0000 mg | Freq: Once | INTRAVENOUS | Status: AC
  Administered 2024-06-05: 1500 mg via INTRAVENOUS
  Filled 2024-06-05: qty 300

## 2024-06-05 MED ORDER — ACETAMINOPHEN 500 MG PO TABS: 500.0000 mg | ORAL_TABLET | Freq: Four times a day (QID) | ORAL | Status: DC | PRN

## 2024-06-05 MED ORDER — BISACODYL 5 MG PO TBEC
5.0000 mg | DELAYED_RELEASE_TABLET | Freq: Every day | ORAL | Status: DC | PRN
  Administered 2024-06-07: 5 mg via ORAL
  Filled 2024-06-05: qty 1

## 2024-06-05 MED ORDER — HEPARIN SODIUM (PORCINE) 5000 UNIT/ML IJ SOLN
5000.0000 [IU] | Freq: Three times a day (TID) | INTRAMUSCULAR | Status: DC
  Administered 2024-06-05: 5000 [IU] via SUBCUTANEOUS
  Filled 2024-06-05: qty 1

## 2024-06-05 MED ORDER — MELATONIN 3 MG PO TABS
3.0000 mg | ORAL_TABLET | Freq: Every evening | ORAL | Status: DC | PRN
  Filled 2024-06-05: qty 1

## 2024-06-05 MED ORDER — ALBUTEROL SULFATE (2.5 MG/3ML) 0.083% IN NEBU: 2.5000 mg | INHALATION_SOLUTION | RESPIRATORY_TRACT | Status: DC | PRN

## 2024-06-05 MED ORDER — FUROSEMIDE 10 MG/ML IJ SOLN
40.0000 mg | Freq: Once | INTRAMUSCULAR | Status: AC
  Administered 2024-06-05: 40 mg via INTRAVENOUS
  Filled 2024-06-05: qty 4

## 2024-06-05 MED ORDER — PIPERACILLIN-TAZOBACTAM 3.375 G IVPB 30 MIN
3.3750 g | Freq: Once | INTRAVENOUS | Status: AC
  Administered 2024-06-05: 3.375 g via INTRAVENOUS
  Filled 2024-06-05: qty 50

## 2024-06-05 MED ORDER — ONDANSETRON HCL 4 MG PO TABS: 4.0000 mg | ORAL_TABLET | Freq: Four times a day (QID) | ORAL | Status: DC | PRN

## 2024-06-05 NOTE — Progress Notes (Signed)
 ED Pharmacy Antibiotic Sign Off An antibiotic consult was received from an ED provider for vanc/zosyn  per pharmacy dosing for sepsis. A chart review was completed to assess appropriateness.   The following one time order(s) were placed:  Vancomycin  1500mg  x1 Zosyn  3.375g IV x1   Further antibiotic and/or antibiotic pharmacy consults should be ordered by the admitting provider if indicated.   Thank you for allowing pharmacy to be a part of this patient's care.   Elma Fail, PharmD PGY1 Clinical Pharmacist [facility]  06/05/2024 3:48 PM

## 2024-06-05 NOTE — H&P (Signed)
" °  History and Physical    Patient: Michael Haas FMW:[id number] DOB: 12-11-1928 DOA: 06/05/2024 DOS: the patient was seen and examined on 06/05/2024 PCP: Pcp, No  Patient coming from: {Point_of_Origin:26777}  Chief Complaint:  Chief Complaint  Patient presents with   Altered Mental Status   HPI: Michael Haas is a 89 y.o. male with medical history significant of ***  Review of Systems: {ROS_Text:26778} Past Medical History:  Diagnosis Date   CHF (congestive heart failure) (HCC)    Hyperlipidemia    Hypertension    Past Surgical History:  Procedure Laterality Date   HERNIA REPAIR     left hand surgery     It was caught in a machine years ago   Social History:  reports that he has never smoked. He quit smokeless tobacco use about 4 years ago.  His smokeless tobacco use included chew. He reports that he does not currently use alcohol. He reports that he does not use drugs.  Allergies[1]  History reviewed. No pertinent family history.  Prior to Admission medications  Medication Sig Start Date End Date Taking? Authorizing Provider  amLODipine  (NORVASC ) 5 MG tablet TAKE 1 TABLET BY MOUTH EVERY DAY 11/30/23   Lonni Slain, MD  atorvastatin  (LIPITOR) 40 MG tablet TAKE 1 TABLET BY MOUTH DAILY 10/04/23   Lonni Slain, MD  ELIQUIS  5 MG TABS tablet TAKE 1 TABLET(5 MG) BY MOUTH TWICE DAILY 07/20/23   Lonni Slain, MD  furosemide  (LASIX ) 40 MG tablet Take 1 tablet (40 mg total) by mouth daily. 11/23/23 02/21/24  Lonni Slain, MD  metoprolol  succinate (TOPROL -XL) 100 MG 24 hr tablet TAKE 1 TABLET BY MOUTH DAILY 03/19/24   Lonni Slain, MD    Physical Exam: Vitals:   06/05/24 1800 06/05/24 1830 06/05/24 1900 06/05/24 1933  BP: 104/62 132/72 122/70   Pulse: (!) 54 64 67   Resp: 16 14 (!) 21   Temp: (!) 92.5 F (33.6 C) (!) 93.3 F (34.1 C) (!) 94.1 F (34.5 C)   TempSrc:      SpO2: 94% 96% 96%   Weight:    71.7 kg  Height:    5' 8  (1.727 m)   *** Data Reviewed: {Tip this will not be part of the note when signed- Document your independent interpretation of telemetry tracing, EKG, lab, Radiology test or any other diagnostic tests. Add any new diagnostic test ordered today. (Optional):26781} {Results:26384}  Assessment and Plan: No notes have been filed under this hospital service. Service: Hospitalist     Advance Care Planning:   Code Status: Prior ***  Consults: ***  Family Communication: ***  Severity of Illness: {Observation/Inpatient:21159}  Author: ARTHEA CHILD, MD 06/05/2024 7:52 PM  For on call review [email or web address].     [1] No Known Allergies  "

## 2024-06-05 NOTE — ED Provider Notes (Signed)
 Signout received from [employer], PA-C.  In short patient is a 89 year old male with history of A-fib, CHF, OSA, hypertension, hyperlipidemia who presents with altered mental status with generalized weakness over the past few days without any focal infectious symptoms.  Patient himself is complaining of some shortness of breath, otherwise no other significant complaints.   Found to be hypothermic upon arrival otherwise hemodynamically stable.  Lab work at this time was notable for leukocytosis 17.4, elevated BNP to 1400 without a mildly elevated troponin of 51, no active chest pain, lactic is within normal limits, mild AKI, chest x-ray is consistent with CHF.  Physical Exam  BP 110/73   Pulse 67   Temp (!) 92.1 F (33.4 C)   Resp 19   SpO2 95%   Physical Exam Vitals and nursing note reviewed.  Constitutional:      General: He is not in acute distress.    Appearance: He is well-developed.  HENT:     Head: Normocephalic and atraumatic.  Eyes:     Conjunctiva/sclera: Conjunctivae normal.  Cardiovascular:     Rate and Rhythm: Normal rate and regular rhythm.     Heart sounds: No murmur heard. Pulmonary:     Effort: Pulmonary effort is normal. No respiratory distress.     Breath sounds: Normal breath sounds.  Abdominal:     Palpations: Abdomen is soft.     Tenderness: There is no abdominal tenderness.  Musculoskeletal:        General: No swelling.     Cervical back: Neck supple.  Skin:    General: Skin is warm and dry.     Capillary Refill: Capillary refill takes less than 2 seconds.  Neurological:     Mental Status: He is alert.     Comments: Patient is alert and oriented to self and place. Unclear on time. There is no abnormal phonation. Symmetric smile without facial droop. No pronator drift. Moves all extremities spontaneously. Left lower extremity with baseline deficit . No sensation deficit. There is no nystagmus. EOMI, PERRL. Coordination intact with finger to nose     Psychiatric:        Mood and Affect: Mood normal.     Procedures  Procedures  ED Course / MDM    Medical Decision Making Amount and/or Complexity of Data Reviewed Labs: ordered. Radiology: ordered.  Risk Prescription drug management.   Discussed patient with spouse reports that over the past several months there has been a gradual cognitive decline, patient has been sleeping longer as well.  In the past few days however he became more confused and lethargic.  No infectious symptoms other than postprandial vomiting over the past few months.  She is not aware of any falls.  Did note that this morning he was no longer able to walk.  He has weakness in his left leg that is chronic.  She does report that he has been noncompliant with his diuretic over the past month.   Hip x-rays with cortical irregularity over the right greater trochanter, will obtain further imaging  CT imaging without any evidence of PE, moderate bilateral pleural effusions with atelectasis versus pneumonia, cardiomegaly, small amount of free fluid in the abdomen and pelvis likely ascites, no evidence of hip fracture  Will pursue admission Discussed patient with Dr. Arthea, agreed for admission    Tatsuya, Okray 06/05/24 1830    Melvenia Motto, MD 06/05/24 2152

## 2024-06-05 NOTE — ED Triage Notes (Signed)
 Patient BIB [facility] from home with c/o of increased AMS and weakness x 3 days. Pt is A&O x3, unsure of what the year is. B/L LE edema. Hx CHF, afib on CM.

## 2024-06-05 NOTE — ED Provider Notes (Signed)
 " [place] [facility] Provider Note   CSN: [id number] Arrival date & time: 06/05/24  1316     Patient presents with: Altered Mental Status   Michael Haas is a 89 y.o. male with past medical history significant for A-fib on anticoagulation, acute hypoxic respiratory failure secondary to heart failure, venous insufficiency, obstructive sleep apnea, hypertension, hyperlipidemia who presents with concern for increased altered mental status, generalized weakness for the last 3 days. Wife reports 3 days of not been acting himself, answering questions appropriately.  Patient reports some mild shortness of breath, otherwise with no acute complaints.    Altered Mental Status      Prior to Admission medications  Medication Sig Start Date End Date Taking? Authorizing Provider  amLODipine  (NORVASC ) 5 MG tablet TAKE 1 TABLET BY MOUTH EVERY DAY 11/30/23   Lonni Slain, MD  atorvastatin  (LIPITOR) 40 MG tablet TAKE 1 TABLET BY MOUTH DAILY 10/04/23   Lonni Slain, MD  ELIQUIS  5 MG TABS tablet TAKE 1 TABLET(5 MG) BY MOUTH TWICE DAILY 07/20/23   Lonni Slain, MD  furosemide  (LASIX ) 40 MG tablet Take 1 tablet (40 mg total) by mouth daily. 11/23/23 02/21/24  Lonni Slain, MD  metoprolol  succinate (TOPROL -XL) 100 MG 24 hr tablet TAKE 1 TABLET BY MOUTH DAILY 03/19/24   Lonni Slain, MD    Allergies: Patient has no known allergies.    Review of Systems  All other systems reviewed and are negative.   Updated Vital Signs BP 125/74   Pulse (!) 56   Temp (!) 90.4 F (32.4 C) (Rectal)   Resp (!) 23   SpO2 96%   Physical Exam Vitals and nursing note reviewed.  Constitutional:      General: He is not in acute distress.    Appearance: Normal appearance.  HENT:     Head: Normocephalic and atraumatic.  Eyes:     General:        Right eye: No discharge.        Left eye: No discharge.  Cardiovascular:     Rate and  Rhythm: Normal rate and regular rhythm.     Heart sounds: No murmur heard.    No friction rub. No gallop.  Pulmonary:     Effort: Pulmonary effort is normal.     Breath sounds: Normal breath sounds.  Abdominal:     General: Bowel sounds are normal.     Palpations: Abdomen is soft.  Skin:    General: Skin is warm and dry.     Capillary Refill: Capillary refill takes less than 2 seconds.  Neurological:     Mental Status: He is alert.     Comments: Alert and oriented to place, situation, somewhat confused about time although did report year as 2025  Moving all 4 spontaneously.  Facial muscles are equal, EOMs intact, pupils equal bilaterally.  Psychiatric:        Mood and Affect: Mood normal.        Behavior: Behavior normal.     (all labs ordered are listed, but only abnormal results are displayed) Labs Reviewed  CBC - Abnormal; Notable for the following components:      Result Value   WBC 17.4 (*)    MCV 103.3 (*)    RDW 17.2 (*)    Platelets 413 (*)    All other components within normal limits  COMPREHENSIVE METABOLIC PANEL WITH GFR - Abnormal; Notable for the following components:   Sodium 146 (*)  Glucose, Bld 112 (*)    BUN 34 (*)    Creatinine, Ser 1.32 (*)    Alkaline Phosphatase 127 (*)    GFR, Estimated 50 (*)    All other components within normal limits  PRO BRAIN NATRIURETIC PEPTIDE - Abnormal; Notable for the following components:   Pro Brain Natriuretic Peptide 1,474.0 (*)    All other components within normal limits  I-STAT CHEM 8, ED - Abnormal; Notable for the following components:   Sodium 146 (*)    BUN 37 (*)    Creatinine, Ser 1.50 (*)    Glucose, Bld 110 (*)    Calcium , Ion 1.11 (*)    All other components within normal limits  TROPONIN T, HIGH SENSITIVITY - Abnormal; Notable for the following components:   Troponin T High Sensitivity 51 (*)    All other components within normal limits  URINE CULTURE  CULTURE, BLOOD (ROUTINE X 2)  CULTURE,  BLOOD (ROUTINE X 2)  URINALYSIS, ROUTINE W REFLEX MICROSCOPIC  TSH  T4, FREE  I-STAT CG4 LACTIC ACID, ED  CBG MONITORING, ED  I-STAT CG4 LACTIC ACID, ED  TROPONIN T, HIGH SENSITIVITY    EKG: None  Radiology: DG Chest Portable 1 View Result Date: 06/05/2024 CLINICAL DATA:  Shortness of breath. Altered mental status. Weakness. EXAM: PORTABLE CHEST - 1 VIEW COMPARISON:  08/16/2019 FINDINGS: Heart is mildly enlarged. Mild pulmonary vascular congestion. Mediastinal silhouette otherwise within normal limits. Bibasilar opacities likely a combination of pulmonary edema, atelectasis, and small pleural effusions. Advanced degenerative changes of the LEFT glenohumeral joint. IMPRESSION: Radiographic findings most consistent with CHF. Electronically Signed   By: Aliene Lloyd M.D.   On: 06/05/2024 14:49     Procedures   Medications Ordered in the ED  furosemide  (LASIX ) injection 40 mg (has no administration in time range)                                    Medical Decision Making  This patient is a 89 y.o. male  who presents to the ED for concern of AMS, leg swelling.   Differential diagnoses prior to evaluation: The emergent differential diagnosis includes, but is not limited to,  CVA, seizure, hypotension, sepsis, hypoglycemia, hypoxic encephalopathy, metabolic encephalopathy, polypharmacy, substance abuse, developing dementia or alzheimers, meningitis, encephalitis, hypertensive emergency, other systemic infection, acute alcohol intoxication, acute alcohol or other drug withdrawal or psychiatric manifestation vs other, asthma exacerbation, COPD exacerbation, acute upper respiratory infection, acute bronchitis, chronic bronchitis, interstitial lung disease, ARDS, PE, pneumonia, atypical ACS, carbon monoxide poisoning, spontaneous pneumothorax, new CHF vs CHF exacerbation, versus other. This is not an exhaustive differential.   Past Medical History / Co-morbidities / Social History: A-fib on  anticoagulation, acute hypoxic respiratory failure secondary to heart failure, venous insufficiency, obstructive sleep apnea, hypertension, hyperlipidemia  Additional history: Chart reviewed. Pertinent results include: Reviewed lab work, imaging from previous emergency department visits remotely, previous echo in 2021 with 45 to 50% ejection fraction  Physical Exam: Physical exam performed. The pertinent findings include: Patient alert and oriented to self, place, situation but not time.  Overall is answering my questions appropriately, does not seem overtly altered, although if he is normally alert to time then this would be a deficit for him.  Otherwise overall well-appearing, he does have bilateral lower extremity edema.  Mild diastolic hypertension arrival, blood pressure 126/95.  Reportedly mildly hypoxic on room air with EMS and  placed on 2 L nasal cannula, however 98% on room air on ED arrival.  Lab Tests/Imaging studies: I personally interpreted labs/imaging and the pertinent results include: CBC with leukocytosis, blood cell 17.4, elevated platelets at 413.  I-STAT Chem-8 with elevated sodium 146, creatinine 1.5, BUN 37, may suggest some dehydration.  Initial BNP is elevated at 1400, given his age does not suggest overt heart failure overload, troponin is mildly elevated at 51, will get delta.  CMP with hyponatremia, sodium 146, elevated BUN, creatinine, BUN 34, creatinine 1.32.  Mildly elevated alkaline phosphatase at 127.  I agree with the radiologist interpretation.  Please note chest x-ray does suggest some CHF, vascular congestion.  CT head without contrast pending at time of handoff.  Medications: Lasix  for heart failure, suspect that his AKI is likely secondary to fluid overload not intravascular depletion.  Cardiac monitoring: EKG obtained and interpreted by myself and attending physician which shows: afib  3:06 PM Care of [facility] ONEIDA Ada transferred to [facility] and Dr.  Melvenia at the end of my shift as the patient will require reassessment once labs/imaging have resulted. Patient presentation, ED course, and plan of care discussed with review of all pertinent labs and imaging. Please see his/her note for further details regarding further ED course and disposition. Plan at time of handoff is pending lab work, imaging, reassessment of altered mental status,. This may be altered or completely changed at the discretion of the oncoming team pending results of further workup.  Final diagnoses:  None    ED Discharge Orders     None          Rosan Sherlean VEAR DEVONNA 06/05/24 1506    Patsey Lot, MD 06/10/24 1455  "

## 2024-06-05 NOTE — ED Notes (Signed)
"  Pt placed on bair hugger  "

## 2024-06-05 NOTE — ED Notes (Signed)
 Patient transported to CT

## 2024-06-06 ENCOUNTER — Inpatient Hospital Stay (HOSPITAL_COMMUNITY)

## 2024-06-06 DIAGNOSIS — I5043 Acute on chronic combined systolic (congestive) and diastolic (congestive) heart failure: Secondary | ICD-10-CM

## 2024-06-06 DIAGNOSIS — G9340 Encephalopathy, unspecified: Secondary | ICD-10-CM | POA: Diagnosis not present

## 2024-06-06 LAB — CBC
HCT: 38.3 % — ABNORMAL LOW (ref 39.0–52.0)
Hemoglobin: 12.7 g/dL — ABNORMAL LOW (ref 13.0–17.0)
MCH: 33.5 pg (ref 26.0–34.0)
MCHC: 33.2 g/dL (ref 30.0–36.0)
MCV: 101.1 fL — ABNORMAL HIGH (ref 80.0–100.0)
Platelets: 359 10*3/uL (ref 150–400)
RBC: 3.79 MIL/uL — ABNORMAL LOW (ref 4.22–5.81)
RDW: 17.2 % — ABNORMAL HIGH (ref 11.5–15.5)
WBC: 13.5 10*3/uL — ABNORMAL HIGH (ref 4.0–10.5)
nRBC: 0 % (ref 0.0–0.2)

## 2024-06-06 LAB — PROCALCITONIN: Procalcitonin: <0.1 ng/mL

## 2024-06-06 LAB — BLOOD CULTURE ID PANEL (REFLEXED) - BCID2

## 2024-06-06 LAB — ECHOCARDIOGRAM COMPLETE
Area-P 1/2: 4.01 cm2
Height: 68 in
S' Lateral: 3.7 cm
Weight: 2634.94 [oz_av]

## 2024-06-06 LAB — COMPREHENSIVE METABOLIC PANEL WITH GFR
ALT: 30 U/L (ref 0–44)
AST: 38 U/L (ref 15–41)
Albumin: 3.5 g/dL (ref 3.5–5.0)
Alkaline Phosphatase: 118 U/L (ref 38–126)
Anion gap: 9 (ref 5–15)
BUN: 35 mg/dL — ABNORMAL HIGH (ref 8–23)
CO2: 30 mmol/L (ref 22–32)
Calcium: 9 mg/dL (ref 8.9–10.3)
Chloride: 109 mmol/L (ref 98–111)
Creatinine, Ser: 1.56 mg/dL — ABNORMAL HIGH (ref 0.61–1.24)
GFR, Estimated: 41 mL/min — ABNORMAL LOW
Glucose, Bld: 94 mg/dL (ref 70–99)
Potassium: 4 mmol/L (ref 3.5–5.1)
Sodium: 147 mmol/L — ABNORMAL HIGH (ref 135–145)
Total Bilirubin: 0.6 mg/dL (ref 0.0–1.2)
Total Protein: 5.8 g/dL — ABNORMAL LOW (ref 6.5–8.1)

## 2024-06-06 LAB — FOLATE: Folate: 7.6 ng/mL

## 2024-06-06 LAB — T4, FREE: Free T4: 1.16 ng/dL (ref 0.80–2.00)

## 2024-06-06 LAB — VITAMIN B12: Vitamin B-12: 602 pg/mL (ref 180–914)

## 2024-06-06 LAB — TSH: TSH: 4.82 u[IU]/mL — ABNORMAL HIGH (ref 0.350–4.500)

## 2024-06-06 LAB — C-REACTIVE PROTEIN: CRP: 1.9 mg/dL — ABNORMAL HIGH

## 2024-06-06 MED ORDER — PERFLUTREN LIPID MICROSPHERE
1.0000 mL | INTRAVENOUS | Status: AC | PRN
  Administered 2024-06-06: 3 mL via INTRAVENOUS

## 2024-06-06 MED ORDER — TAMSULOSIN HCL 0.4 MG PO CAPS
0.4000 mg | ORAL_CAPSULE | Freq: Every day | ORAL | Status: DC
  Administered 2024-06-06 – 2024-06-11 (×6): 0.4 mg via ORAL
  Filled 2024-06-06 (×6): qty 1

## 2024-06-06 MED ORDER — APIXABAN 5 MG PO TABS
5.0000 mg | ORAL_TABLET | Freq: Two times a day (BID) | ORAL | Status: DC
  Administered 2024-06-06 – 2024-06-11 (×11): 5 mg via ORAL
  Filled 2024-06-06 (×12): qty 1

## 2024-06-06 MED ORDER — ATORVASTATIN CALCIUM 40 MG PO TABS
40.0000 mg | ORAL_TABLET | Freq: Every day | ORAL | Status: DC
  Administered 2024-06-06 – 2024-06-11 (×6): 40 mg via ORAL
  Filled 2024-06-06 (×6): qty 1

## 2024-06-06 MED ORDER — FUROSEMIDE 10 MG/ML IJ SOLN: 80.0000 mg | Freq: Two times a day (BID) | INTRAMUSCULAR | Status: DC

## 2024-06-06 MED ORDER — METOPROLOL SUCCINATE ER 50 MG PO TB24
50.0000 mg | ORAL_TABLET | Freq: Every day | ORAL | Status: DC
  Administered 2024-06-06 – 2024-06-11 (×6): 50 mg via ORAL
  Filled 2024-06-06 (×6): qty 1

## 2024-06-06 MED ORDER — METOPROLOL SUCCINATE ER 100 MG PO TB24: 100.0000 mg | ORAL_TABLET | Freq: Every day | ORAL | Status: DC

## 2024-06-06 MED ORDER — FUROSEMIDE 10 MG/ML IJ SOLN: 40.0000 mg | Freq: Every day | INTRAMUSCULAR | Status: DC

## 2024-06-06 MED ORDER — AMLODIPINE BESYLATE 5 MG PO TABS: 5.0000 mg | ORAL_TABLET | Freq: Every day | ORAL | Status: DC

## 2024-06-06 MED ORDER — CHLORHEXIDINE GLUCONATE CLOTH 2 % EX PADS
6.0000 | MEDICATED_PAD | Freq: Every day | CUTANEOUS | Status: DC
  Administered 2024-06-07 – 2024-06-11 (×5): 6 via TOPICAL

## 2024-06-06 MED ORDER — FUROSEMIDE 10 MG/ML IJ SOLN
80.0000 mg | Freq: Two times a day (BID) | INTRAMUSCULAR | Status: DC
  Administered 2024-06-06 – 2024-06-07 (×4): 80 mg via INTRAVENOUS
  Filled 2024-06-06 (×4): qty 8

## 2024-06-06 NOTE — Plan of Care (Signed)
" °  Problem: Health Behavior/Discharge Planning: Goal: Ability to manage health-related needs will improve Outcome: Progressing   Problem: Clinical Measurements: Goal: Diagnostic test results will improve Outcome: Progressing Goal: Cardiovascular complication will be avoided Outcome: Progressing   Problem: Nutrition: Goal: Adequate nutrition will be maintained Outcome: Progressing   "

## 2024-06-06 NOTE — Progress Notes (Signed)
 Pt temp was 98.5 and bair hugger was removed per MD secure chat.

## 2024-06-06 NOTE — Care Management (Signed)
 SDOH resources added to AVS

## 2024-06-06 NOTE — Progress Notes (Addendum)
 " PROGRESS NOTE    Michael Haas  FMW:[id number] DOB: [birth year] DOA: 06/05/2024 PCP: Pcp, No  89/M with history of chronic combined CHF, paroxysmal A-fib on Eliquis , hypertension, chronic venous stasis ulcers, BPH, poor compliance with medications, had stopped taking all his heart medicines as well as using his CPAP and then saw cardiology few months ago and restarted some of his diuretics then. - Was brought to the ED by EMS 1/27 with confusion for 3 days, mild dyspnea, in the ED he was hypothermic, creatinine was 1.3, sodium 146, BNP 1474, troponin 51, WBC 17, flu COVID and RSV PCR were negative, he was placed under a Bair hugger and subsequently temperature improved and alertness improved as well CT head with moderate chronic small vessel disease CT chest and abdomen pelvis -moderate bilateral effusions, small ascites, 2 cm lesion near pancreatic head, likely IPMN - Foley catheter placed in the ED for urinary retention  Subjective: Awake and alert today, reports some dyspnea on exertion, swelling  Assessment and Plan:  Hypothermia Acute metabolic encephalopathy -Resolved - TSH minimally elevated, free T4 is normal - No evidence of acute infectious process at this time, UA unremarkable, chest x-ray with effusions, CT abdomen pelvis as noted above - Monitor clinically without ABX, check procalcitonin - Also check echocardiogram  Acute on chronic combined CHF - Last echo 4/21 noted EF of 45-50% - Follow-up repeat echo - Significantly volume overloaded, increase Lasix  to 80 mg twice daily, continue Toprol  - Unna boots, DC amlodipine  - Add GDMT slowly as tolerated  Memory/cognitive issues -with functional decline  Permanent atrial fibrillation - Continue Toprol  and apixaban   BPH, urinary retention - Foley placed in the ED, add Flomax  - Attempt voiding trial when more ambulatory - Will need urology follow-up  2 cm pancreatic head/neck lesion - Suspected to be IPMN - Follow-up  with PCP, likely low yield to follow this at his age   DVT prophylaxis: Apixaban  Code Status: called and d/w spouse, DNR confirmed Family Communication: No family at bedside , called and updated spouse Disposition Plan: Home versus rehab  Consultants:    Procedures:   Antimicrobials:    Objective: Vitals:   06/05/24 2149 06/06/24 0327 06/06/24 0740 06/06/24 1047  BP: 111/70 (!) 101/58 115/63 100/72  Pulse:  81 (!) 102   Resp: 20 (!) 24 17   Temp: (!) 97.4 F (36.3 C) (!) 97.4 F (36.3 C)  (!) 97.5 F (36.4 C)  TempSrc: Oral Oral  Oral  SpO2:  93% 91%   Weight: 74.7 kg     Height:        Intake/Output Summary (Last 24 hours) at 06/06/2024 1053 Last data filed at 06/06/2024 0329 Gross per 24 hour  Intake --  Output 500 ml  Net -500 ml   Filed Weights   06/05/24 1933 06/05/24 2149  Weight: 71.7 kg 74.7 kg    Examination:  General exam: Elderly frail chronically ill-appearing, AAO x 2, mild cognitive deficits HEENT: Positive JVD Respiratory system: Decreased breath sounds at the bases Cardiovascular system: S1 & S2 heard, irregular Abd: nondistended, soft and nontender.Normal bowel sounds heard. Extremities: 2+ edema Skin: No rashes Psychiatry: Flat affect Neuro: Mild cognitive deficits otherwise moves all extremities, no localizing signs    Data Reviewed:   CBC: Recent Labs  Lab 06/05/24 1325 06/05/24 1346 06/06/24 0318  WBC 17.4*  --  13.5*  HGB 14.5 15.3 12.7*  HCT 44.4 45.0 38.3*  MCV 103.3*  --  101.1*  PLT  413*  --  359   Basic Metabolic Panel: Recent Labs  Lab 06/05/24 1325 06/05/24 1346 06/06/24 0318  NA 146* 146* 147*  K 4.6 4.2 4.0  CL 108 108 109  CO2 26  --  30  GLUCOSE 112* 110* 94  BUN 34* 37* 35*  CREATININE 1.32* 1.50* 1.56*  CALCIUM  9.3  --  9.0   GFR: Estimated Creatinine Clearance: 28.6 mL/min (A) (by C-G formula based on SCr of 1.56 mg/dL (H)). Liver Function Tests: Recent Labs  Lab 06/05/24 1325 06/06/24 0318   AST 41 38  ALT 27 30  ALKPHOS 127* 118  BILITOT 0.6 0.6  PROT 6.7 5.8*  ALBUMIN 3.8 3.5   No results for input(s): LIPASE, AMYLASE in the last 168 hours. No results for input(s): AMMONIA in the last 168 hours. Coagulation Profile: No results for input(s): INR, PROTIME in the last 168 hours. Cardiac Enzymes: No results for input(s): CKTOTAL, CKMB, CKMBINDEX, TROPONINI in the last 168 hours. BNP (last 3 results) Recent Labs    06/05/24 1325  PROBNP 1,474.0*   HbA1C: No results for input(s): HGBA1C in the last 72 hours. CBG: No results for input(s): GLUCAP in the last 168 hours. Lipid Profile: No results for input(s): CHOL, HDL, LDLCALC, TRIG, CHOLHDL, LDLDIRECT in the last 72 hours. Thyroid Function Tests: Recent Labs    06/05/24 2300 06/06/24 0318  TSH 4.820*  --   FREET4  --  1.16   Anemia Panel: Recent Labs    06/06/24 0318  VITAMINB12 602  FOLATE 7.6   Urine analysis:    Component Value Date/Time   COLORURINE YELLOW 06/05/2024 1558   APPEARANCEUR CLEAR 06/05/2024 1558   LABSPEC 1.020 06/05/2024 1558   PHURINE 5.0 06/05/2024 1558   GLUCOSEU NEGATIVE 06/05/2024 1558   HGBUR NEGATIVE 06/05/2024 1558   BILIRUBINUR NEGATIVE 06/05/2024 1558   KETONESUR NEGATIVE 06/05/2024 1558   PROTEINUR 100 (A) 06/05/2024 1558   NITRITE NEGATIVE 06/05/2024 1558   LEUKOCYTESUR NEGATIVE 06/05/2024 1558   Sepsis Labs: @LABRCNTIP (procalcitonin:4,lacticidven:4)  ) Recent Results (from the past 240 hours)  Blood culture (routine x 2)     Status: None (Preliminary result)   Collection Time: 06/05/24  2:17 PM   Specimen: BLOOD  Result Value Ref Range Status   Specimen Description BLOOD LEFT ANTECUBITAL  Final   Special Requests   Final    BOTTLES DRAWN AEROBIC AND ANAEROBIC Blood Culture adequate volume   Culture   Final    NO GROWTH < 24 HOURS Performed at [facility], [address] [place], [place] [address]    Report Status  PENDING  Incomplete  Blood culture (routine x 2)     Status: None (Preliminary result)   Collection Time: 06/05/24  2:22 PM   Specimen: BLOOD  Result Value Ref Range Status   Specimen Description BLOOD RIGHT ANTECUBITAL  Final   Special Requests   Final    BOTTLES DRAWN AEROBIC AND ANAEROBIC Blood Culture adequate volume   Culture   Final    NO GROWTH < 24 HOURS Performed at [facility], [address], [place], [place] [address]    Report Status PENDING  Incomplete  Resp panel by RT-PCR (RSV, Flu A&B, Covid)     Status: None   Collection Time: 06/05/24  3:36 PM   Specimen: Nasal Swab  Result Value Ref Range Status   SARS Coronavirus 2 by RT PCR NEGATIVE NEGATIVE Final   Influenza A by PCR NEGATIVE  NEGATIVE Final   Influenza B by PCR NEGATIVE NEGATIVE Final    Comment: (NOTE) The Xpert Xpress SARS-CoV-2/FLU/RSV plus assay is intended as an aid in the diagnosis of influenza from Nasopharyngeal swab specimens and should not be used as a sole basis for treatment. Nasal washings and aspirates are unacceptable for Xpert Xpress SARS-CoV-2/FLU/RSV testing.  Fact Sheet for Patients: bloggercourse.com  Fact Sheet for Healthcare Providers: seriousbroker.it  This test is not yet approved or cleared by the [place]  FDA and has been authorized for detection and/or diagnosis of SARS-CoV-2 by FDA under an Emergency Use Authorization (EUA). This EUA will remain in effect (meaning this test can be used) for the duration of the COVID-19 declaration under Section 564(b)(1) of the Act, 21 U.S.C. section 360bbb-3(b)(1), unless the authorization is terminated or revoked.     Resp Syncytial Virus by PCR NEGATIVE NEGATIVE Final    Comment: (NOTE) Fact Sheet for Patients: bloggercourse.com  Fact Sheet for Healthcare Providers: seriousbroker.it  This test is not yet approved or  cleared by the [place]  FDA and has been authorized for detection and/or diagnosis of SARS-CoV-2 by FDA under an Emergency Use Authorization (EUA). This EUA will remain in effect (meaning this test can be used) for the duration of the COVID-19 declaration under Section 564(b)(1) of the Act, 21 U.S.C. section 360bbb-3(b)(1), unless the authorization is terminated or revoked.  Performed at [facility], [address], [place], [place] [address]      Radiology Studies: CT Angio Chest PE W and/or Wo Contrast Result Date: 06/05/2024 EXAM: CTA CHEST PE WITH CONTRAST CT ABDOMEN AND PELVIS WITH CONTRAST 06/05/2024 05:03:11 PM TECHNIQUE: CTA of the chest was performed after the administration of intravenous contrast. Multiplanar reformatted images are provided for review. MIP images are provided for review. CT of the abdomen and pelvis was performed with the administration of intravenous contrast. 75 mL of iohexol  (OMNIPAQUE ) 350 MG/ML injection was administered. Automated exposure control, iterative reconstruction, and/or weight based adjustment of the mA/kV was utilized to reduce the radiation dose to as low as reasonably achievable. COMPARISON: None available. CLINICAL HISTORY: Shortness of breath. Acute nonlocalized abdominal pain. FINDINGS: CHEST: PULMONARY ARTERIES: Pulmonary arteries are adequately opacified for evaluation. No intraluminal filling defect to suggest pulmonary embolism. Main pulmonary artery is normal in caliber. MEDIASTINUM: No mediastinal lymphadenopathy. Cardiac enlargement. Calcification of the thoracic aorta and coronary arteries. No thoracic aortic aneurysm. LUNGS AND PLEURA: Moderate bilateral pleural effusions. Atelectasis or pneumonia in the lung bases. No pneumothorax. SOFT TISSUES AND BONES: Degenerative changes in the thoracic spine. No acute soft tissue abnormality. ABDOMEN AND PELVIS: LIVER: No focal liver lesions. GALLBLADDER AND BILE DUCTS: Mild pericholecystic  edema is likely related to ascites. No biliary ductal dilatation. SPLEEN: Spleen demonstrates no acute abnormality. PANCREAS: Circumscribed cystic lesion in the junction of the head and body of the pancreas measuring 2 cm in diameter. No pancreatic ductal dilatation or stranding. This may represent IPMN. ADRENAL GLANDS: Adrenal glands demonstrate no acute abnormality. KIDNEYS, URETERS AND BLADDER: Kidneys are atrophic. Nephrograms are homogeneous. No solid masses. No hydronephrosis or hydroureter. No stones in the kidneys or ureters. No perinephric or periureteral stranding. The bladder is decompressed with a foley catheter in place. GI AND BOWEL: The stomach, small bowel, and colon are not abnormally distended. No wall thickening or inflammatory stranding is noted. There is no bowel obstruction. REPRODUCTIVE: The prostate gland is enlarged. PERITONEUM AND RETROPERITONEUM: Small amount of free fluid in the abdomen and pelvis. No  free air. LYMPH NODES: No significant lymphadenopathy. BONES AND SOFT TISSUES: Spondylolysis with moderate spondylolisthesis at L5-S1. Degenerative changes throughout the spine. Edema in the subcutaneous fat of the lower abdomen and pelvis. No acute abnormality of the visualized bones. No focal soft tissue abnormality. VASCULATURE: Of the abdominal aorta. No aneurysm. IMPRESSION: 1. No evidence of pulmonary embolism. 2. Moderate bilateral pleural effusions with atelectasis or pneumonia in the lung bases. No pneumothorax. 3. Cardiomegaly. 4. Small amount of free fluid in the abdomen and pelvis, likely ascites. 5. Circumscribed 2 cm cystic lesion at the junction of the head and body of the pancreas, possibly representing IPMN. Given age, no further surveillance is recommended in the absence of worrisome features. Electronically signed by: Elsie Gravely MD 06/05/2024 05:45 PM EST RP Workstation: [id number]   CT ABDOMEN PELVIS W CONTRAST Result Date: 06/05/2024 EXAM: CTA CHEST PE WITH  CONTRAST CT ABDOMEN AND PELVIS WITH CONTRAST 06/05/2024 05:03:11 PM TECHNIQUE: CTA of the chest was performed after the administration of intravenous contrast. Multiplanar reformatted images are provided for review. MIP images are provided for review. CT of the abdomen and pelvis was performed with the administration of intravenous contrast. 75 mL of iohexol  (OMNIPAQUE ) 350 MG/ML injection was administered. Automated exposure control, iterative reconstruction, and/or weight based adjustment of the mA/kV was utilized to reduce the radiation dose to as low as reasonably achievable. COMPARISON: None available. CLINICAL HISTORY: Shortness of breath. Acute nonlocalized abdominal pain. FINDINGS: CHEST: PULMONARY ARTERIES: Pulmonary arteries are adequately opacified for evaluation. No intraluminal filling defect to suggest pulmonary embolism. Main pulmonary artery is normal in caliber. MEDIASTINUM: No mediastinal lymphadenopathy. Cardiac enlargement. Calcification of the thoracic aorta and coronary arteries. No thoracic aortic aneurysm. LUNGS AND PLEURA: Moderate bilateral pleural effusions. Atelectasis or pneumonia in the lung bases. No pneumothorax. SOFT TISSUES AND BONES: Degenerative changes in the thoracic spine. No acute soft tissue abnormality. ABDOMEN AND PELVIS: LIVER: No focal liver lesions. GALLBLADDER AND BILE DUCTS: Mild pericholecystic edema is likely related to ascites. No biliary ductal dilatation. SPLEEN: Spleen demonstrates no acute abnormality. PANCREAS: Circumscribed cystic lesion in the junction of the head and body of the pancreas measuring 2 cm in diameter. No pancreatic ductal dilatation or stranding. This may represent IPMN. ADRENAL GLANDS: Adrenal glands demonstrate no acute abnormality. KIDNEYS, URETERS AND BLADDER: Kidneys are atrophic. Nephrograms are homogeneous. No solid masses. No hydronephrosis or hydroureter. No stones in the kidneys or ureters. No perinephric or periureteral stranding.  The bladder is decompressed with a foley catheter in place. GI AND BOWEL: The stomach, small bowel, and colon are not abnormally distended. No wall thickening or inflammatory stranding is noted. There is no bowel obstruction. REPRODUCTIVE: The prostate gland is enlarged. PERITONEUM AND RETROPERITONEUM: Small amount of free fluid in the abdomen and pelvis. No free air. LYMPH NODES: No significant lymphadenopathy. BONES AND SOFT TISSUES: Spondylolysis with moderate spondylolisthesis at L5-S1. Degenerative changes throughout the spine. Edema in the subcutaneous fat of the lower abdomen and pelvis. No acute abnormality of the visualized bones. No focal soft tissue abnormality. VASCULATURE: Of the abdominal aorta. No aneurysm. IMPRESSION: 1. No evidence of pulmonary embolism. 2. Moderate bilateral pleural effusions with atelectasis or pneumonia in the lung bases. No pneumothorax. 3. Cardiomegaly. 4. Small amount of free fluid in the abdomen and pelvis, likely ascites. 5. Circumscribed 2 cm cystic lesion at the junction of the head and body of the pancreas, possibly representing IPMN. Given age, no further surveillance is recommended in the absence of worrisome features. Electronically  signed by: Elsie Gravely MD 06/05/2024 05:45 PM EST RP Workstation: [id number]   DG Pelvis Portable Result Date: 06/05/2024 EXAM: 1 or 2 VIEW(S) XRAY OF THE PELVIS 06/05/2024 03:52:00 PM COMPARISON: CT pelvis 08/16/2019. CLINICAL HISTORY: Loss of ambulation, altered mental status. FINDINGS: BONES AND JOINTS: Cortical irregularity over the right greater trochanter possibly representing nondisplaced fracture. Rotation of the right hip limits evaluation. Recommend dedicated right hip views for further evaluation. Degenerative changes in the lower lumbar spine and bilateral hips. SOFT TISSUES: Unremarkable. LINES AND TUBES: Catheter overlying the pelvis. IMPRESSION: 1. Cortical irregularity over the right greater trochanter, possibly  representing a nondisplaced fracture; rotation of the right hip limits evaluation. Dedicated right hip views are recommended for further evaluation. 2. Degenerative changes in the lower lumbar spine and bilateral hips. Electronically signed by: Elsie Gravely MD 06/05/2024 04:26 PM EST RP Workstation: [id number]   CT Head Wo Contrast EXAM: CT HEAD WITHOUT CONTRAST 06/05/2024 02:05:25 PM TECHNIQUE: CT of the head was performed without the administration of intravenous contrast. Automated exposure control, iterative reconstruction, and/or weight based adjustment of the mA/kV was utilized to reduce the radiation dose to as low as reasonably achievable. COMPARISON: None available. CLINICAL HISTORY: Mental status change, unknown cause. FINDINGS: BRAIN AND VENTRICLES: There is no evidence of an acute cortically based infarct, intracranial hemorrhage, mass, midline shift, hydrocephalus, or extra-axial fluid collection. Lacunar infarcts are present in the basal ganglia and thalami bilaterally, some of which are chronic in appearance while others are of indeterminate age. Patchy hypodensities in the cerebral white matter bilaterally are nonspecific but compatible with moderate chronic small vessel ischemic disease. Cerebral atrophy is mild for age. Calcified atherosclerosis at the skull base. ORBITS: No acute abnormality. SINUSES: No acute abnormality. SOFT TISSUES AND SKULL: No acute soft tissue abnormality. No skull fracture. IMPRESSION: 1. No evidence of an acute cortical infarct or intracranial hemorrhage. 2. Moderate chronic small vessel ischemic disease with age-indeterminate lacunar infarcts in the deep ganglia. Electronically signed by: Dasie Hamburg MD 06/05/2024 03:45 PM EST RP Workstation: [id number]   DG Chest Portable 1 View Result Date: 06/05/2024 CLINICAL DATA:  Shortness of breath. Altered mental status. Weakness. EXAM: PORTABLE CHEST - 1 VIEW COMPARISON:  08/16/2019 FINDINGS: Heart is mildly enlarged.  Mild pulmonary vascular congestion. Mediastinal silhouette otherwise within normal limits. Bibasilar opacities likely a combination of pulmonary edema, atelectasis, and small pleural effusions. Advanced degenerative changes of the LEFT glenohumeral joint. IMPRESSION: Radiographic findings most consistent with CHF. Electronically Signed   By: Aliene Lloyd M.D.   On: 06/05/2024 14:49     Scheduled Meds:  apixaban   5 mg Oral BID   atorvastatin   40 mg Oral Daily   furosemide   80 mg Intravenous BID   metoprolol  succinate  50 mg Oral Daily   sodium chloride  flush  3 mL Intravenous Q12H   Continuous Infusions:   LOS: 1 day    Time spent:    Sigurd Pac, MD [facility]   06/06/2024, 10:53 AM    "

## 2024-06-06 NOTE — Progress Notes (Signed)
 Orthopedic Tech Progress Note Patient Details:  Michael Haas 1928/08/28 [id number]  Ortho Devices Type of Ortho Device: Nonie boot Ortho Device/Splint Location: BLE Ortho Device/Splint Interventions: Ordered, Application, Adjustment   Post Interventions Patient Tolerated: Well Instructions Provided: Care of device, Adjustment of device  [name] 06/06/2024, 10:19 AM

## 2024-06-06 NOTE — TOC CM/SW Note (Signed)
 Transition of Care [facility]) - Inpatient Brief Assessment   Patient Details  Name: Michael Haas MRN: [id number] Date of Birth: [birth year]  [facility] of [facility] CM/SW Contact:    Michael Barnie Rama, RN Phone Number: 06/06/2024, 12:42 PM   Clinical Narrative: From home with spouse, has no  PCP and has insurance on file, has no HH services in place at this time , has walker, cane and rollator at home.  Wife will transport them home at dc and she is support system, states gets medications from [facility] in [place].  Pta self ambulatory with rollator.  Patient does not have a PCP, and wife states he will not go to a doctor, but he will go to see the Cardilogist  Michael Haas,  NCM will make a follow up with her for patient.    Transition of Care Asessment: Insurance and Status: Insurance coverage has been reviewed Patient has primary care physician: No (wife states he will not go to a doctor, only sees Cardiologist) Home environment has been reviewed: home with wife Prior level of function:: ambulattory with rollator Prior/Current Home Services: Current home services (walker, cane, rollator) Social Drivers of Health Review: SDOH reviewed no interventions necessary Readmission risk has been reviewed: Yes Transition of care needs: no transition of care needs at this time

## 2024-06-06 NOTE — Progress Notes (Signed)
 PHARMACY - PHYSICIAN COMMUNICATION CRITICAL VALUE ALERT - BLOOD CULTURE IDENTIFICATION (BCID)  Michael Haas is an 89 y.o. male who presented to [facility] on 06/05/2024 with a chief complaint of confusion x 3 days.  Assessment:  1 of 4 bottles GPC in clusters, nothing identified on BCID  Name of physician (or Provider) Contacted: Dr. Lee  Current antibiotics: none  Changes to prescribed antibiotics recommended:  Recommendations accepted by provider - likely contaminant  Results for orders placed or performed during the hospital encounter of 06/05/24  Blood Culture ID Panel (Reflexed) (Collected: 06/05/2024  2:22 PM)  Result Value Ref Range   Enterococcus faecalis NOT DETECTED NOT DETECTED   Enterococcus Faecium NOT DETECTED NOT DETECTED   Listeria monocytogenes NOT DETECTED NOT DETECTED   Staphylococcus species NOT DETECTED NOT DETECTED   Staphylococcus aureus (BCID) NOT DETECTED NOT DETECTED   Staphylococcus epidermidis NOT DETECTED NOT DETECTED   Staphylococcus lugdunensis NOT DETECTED NOT DETECTED   Streptococcus species NOT DETECTED NOT DETECTED   Streptococcus agalactiae NOT DETECTED NOT DETECTED   Streptococcus pneumoniae NOT DETECTED NOT DETECTED   Streptococcus pyogenes NOT DETECTED NOT DETECTED   A.calcoaceticus-baumannii NOT DETECTED NOT DETECTED   Bacteroides fragilis NOT DETECTED NOT DETECTED   Enterobacterales NOT DETECTED NOT DETECTED   Enterobacter cloacae complex NOT DETECTED NOT DETECTED   Escherichia coli NOT DETECTED NOT DETECTED   Klebsiella aerogenes NOT DETECTED NOT DETECTED   Klebsiella oxytoca NOT DETECTED NOT DETECTED   Klebsiella pneumoniae NOT DETECTED NOT DETECTED   Proteus species NOT DETECTED NOT DETECTED   Salmonella species NOT DETECTED NOT DETECTED   Serratia marcescens NOT DETECTED NOT DETECTED   Haemophilus influenzae NOT DETECTED NOT DETECTED   Neisseria meningitidis NOT DETECTED NOT DETECTED   Pseudomonas aeruginosa NOT DETECTED NOT  DETECTED   Stenotrophomonas maltophilia NOT DETECTED NOT DETECTED   Candida albicans NOT DETECTED NOT DETECTED   Candida auris NOT DETECTED NOT DETECTED   Candida glabrata NOT DETECTED NOT DETECTED   Candida krusei NOT DETECTED NOT DETECTED   Candida parapsilosis NOT DETECTED NOT DETECTED   Candida tropicalis NOT DETECTED NOT DETECTED   Cryptococcus neoformans/gattii NOT DETECTED NOT DETECTED    [name], PharmD, BCPS 06/06/2024  8:00 PM

## 2024-06-06 NOTE — Discharge Instructions (Signed)

## 2024-06-07 DIAGNOSIS — G9340 Encephalopathy, unspecified: Secondary | ICD-10-CM | POA: Diagnosis not present

## 2024-06-07 LAB — COMPREHENSIVE METABOLIC PANEL WITH GFR
ALT: 23 U/L (ref 0–44)
AST: 28 U/L (ref 15–41)
Albumin: 3.5 g/dL (ref 3.5–5.0)
Alkaline Phosphatase: 117 U/L (ref 38–126)
Anion gap: 10 (ref 5–15)
BUN: 36 mg/dL — ABNORMAL HIGH (ref 8–23)
CO2: 33 mmol/L — ABNORMAL HIGH (ref 22–32)
Calcium: 9 mg/dL (ref 8.9–10.3)
Chloride: 105 mmol/L (ref 98–111)
Creatinine, Ser: 1.66 mg/dL — ABNORMAL HIGH (ref 0.61–1.24)
GFR, Estimated: 38 mL/min — ABNORMAL LOW
Glucose, Bld: 82 mg/dL (ref 70–99)
Potassium: 3.4 mmol/L — ABNORMAL LOW (ref 3.5–5.1)
Sodium: 147 mmol/L — ABNORMAL HIGH (ref 135–145)
Total Bilirubin: 0.8 mg/dL (ref 0.0–1.2)
Total Protein: 6 g/dL — ABNORMAL LOW (ref 6.5–8.1)

## 2024-06-07 LAB — CBC
HCT: 41.5 % (ref 39.0–52.0)
Hemoglobin: 13.6 g/dL (ref 13.0–17.0)
MCH: 33.1 pg (ref 26.0–34.0)
MCHC: 32.8 g/dL (ref 30.0–36.0)
MCV: 101 fL — ABNORMAL HIGH (ref 80.0–100.0)
Platelets: 339 10*3/uL (ref 150–400)
RBC: 4.11 MIL/uL — ABNORMAL LOW (ref 4.22–5.81)
RDW: 17.2 % — ABNORMAL HIGH (ref 11.5–15.5)
WBC: 15.5 10*3/uL — ABNORMAL HIGH (ref 4.0–10.5)
nRBC: 0 % (ref 0.0–0.2)

## 2024-06-07 LAB — URINE CULTURE: Culture: NO GROWTH

## 2024-06-07 LAB — T3 UPTAKE: T3 Uptake Ratio: 29 % (ref 24–39)

## 2024-06-07 LAB — T3, FREE: T3, Free: 2.5 pg/mL (ref 2.0–4.4)

## 2024-06-07 MED ORDER — POTASSIUM CHLORIDE CRYS ER 20 MEQ PO TBCR
40.0000 meq | EXTENDED_RELEASE_TABLET | Freq: Two times a day (BID) | ORAL | Status: AC
  Administered 2024-06-07 (×2): 40 meq via ORAL
  Filled 2024-06-07 (×2): qty 2

## 2024-06-07 NOTE — Progress Notes (Signed)
 Mobility Specialist Progress Note:    06/07/24 1507  Mobility  Activity Pivoted/transferred from chair to bed  Level of Assistance Moderate assist, patient does 50-74%  Assistive Device Other (Comment) (HHA)  Distance Ambulated (ft) 3 ft  Range of Motion/Exercises Active  Activity Response Tolerated fair  Mobility Referral Yes  Mobility visit 1 Mobility  Mobility Specialist Start Time (ACUTE ONLY) 1507  Mobility Specialist Stop Time (ACUTE ONLY) 1517  Mobility Specialist Time Calculation (min) (ACUTE ONLY) 10 min   Received pt sitting in recliner requesting to be transferred back to bed. No c/o any symptoms. Pt able to stand and transfer w/ ModA. Pt able to lay back in bed and assist w/ boosting. Returned pt to bed w/ all needs met.   Venetia Keel Mobility Specialist Please Neurosurgeon or Rehab Office at [phone]

## 2024-06-07 NOTE — Progress Notes (Signed)
 " PROGRESS NOTE    Michael Haas  FMW:[id number] DOB: Jul 24, 1928 DOA: 06/05/2024 PCP: Pcp, No  89/M with history of chronic combined CHF, paroxysmal A-fib on Eliquis , hypertension, chronic venous stasis ulcers, BPH, poor compliance with medications, had stopped taking all his heart medicines as well as using his CPAP and then saw cardiology few months ago and restarted some of his diuretics then. - Was brought to the ED by EMS 1/27 with confusion for 3 days, mild dyspnea, in the ED he was hypothermic, creatinine was 1.3, sodium 146, BNP 1474, troponin 51, WBC 17, flu COVID and RSV PCR were negative, he was placed under a Bair hugger and subsequently temperature improved and alertness improved as well CT head with moderate chronic small vessel disease CT chest and abdomen pelvis -moderate bilateral effusions, small ascites, 2 cm lesion near pancreatic head, likely IPMN - Foley catheter placed in the ED for urinary retention  Subjective: Awake and alert today, reports some dyspnea on exertion, swelling  Assessment and Plan:  Hypothermia Acute metabolic encephalopathy Leukocytosis -Hypothermia and encephalopathy have resolved - TSH minimally elevated, free T4 is normal - No evidence of acute infectious process at this time, UA unremarkable, chest x-ray with effusions, CT abdomen pelvis as noted above - WBC remains elevated, procalcitonin is low, blood culture with GPC in clusters in 1 out of 4 bottles likely contaminant> monitor clinically> Foley placed for retention, may need this removed if leukocytosis persists - Echo with preserved EF  Acute on chronic combined CHF RV failure Pulmonary hypertension - Last echo 4/21 noted EF of 45-50% - Repeat echo now with EF of 55-60%, moderately reduced RV, moderately elevated PASP - Significantly volume overloaded, continue Lasix  to 80 mg twice daily, continue Toprol  - Unna boots, DC amlodipine  - Add GDMT slowly as tolerated - Overall poor  prognosis with cognitive decline, advanced age, frailty, palliative consulted for goals of care  Memory/cognitive issues -with functional decline  Permanent atrial fibrillation - Continue Toprol  and apixaban   BPH, urinary retention - Foley placed in the ED, continue Flomax  - Attempt voiding trial when more ambulatory> will DC catheter if leukocytosis persists  - Will need urology follow-up  2 cm pancreatic head/neck lesion - Suspected to be IPMN - Follow-up with PCP, likely low yield to follow this at his age   DVT prophylaxis: Apixaban  Code Status: called and d/w spouse, DNR confirmed Family Communication: No family at bedside , called and updated spouse Disposition Plan: Home versus rehab  Consultants:    Procedures:   Antimicrobials:    Objective: Vitals:   06/06/24 2331 06/07/24 0316 06/07/24 0744 06/07/24 1124  BP: 108/78 121/71 117/74 106/71  Pulse: 85 88 84 87  Resp: 17  19 19   Temp: (!) 97.1 F (36.2 C) 98.5 F (36.9 C) 98.4 F (36.9 C) 97.6 F (36.4 C)  TempSrc: Axillary Oral Oral Oral  SpO2: 91% 90% 93% 90%  Weight:      Height:        Intake/Output Summary (Last 24 hours) at 06/07/2024 1138 Last data filed at 06/07/2024 0900 Gross per 24 hour  Intake 360 ml  Output 2275 ml  Net -1915 ml   Filed Weights   06/05/24 1933 06/05/24 2149  Weight: 71.7 kg 74.7 kg    Examination:  General exam: Elderly frail chronically ill-appearing, AAO x 2, mild cognitive deficits, no distress HEENT: Positive JVD Respiratory system: Improving air movement, diminished at the bases Cardiovascular system: S1 & S2 heard, irregular Abd:  nondistended, soft and nontender.Normal bowel sounds heard. Extremities: Unna boots on, 1-2+ edema Skin: No rashes Psychiatry: Flat affect Neuro: Mild cognitive deficits otherwise moves all extremities, no localizing signs    Data Reviewed:   CBC: Recent Labs  Lab 06/05/24 1325 06/05/24 1346 06/06/24 0318 06/07/24 0254   WBC 17.4*  --  13.5* 15.5*  HGB 14.5 15.3 12.7* 13.6  HCT 44.4 45.0 38.3* 41.5  MCV 103.3*  --  101.1* 101.0*  PLT 413*  --  359 339   Basic Metabolic Panel: Recent Labs  Lab 06/05/24 1325 06/05/24 1346 06/06/24 0318 06/07/24 0254  NA 146* 146* 147* 147*  K 4.6 4.2 4.0 3.4*  CL 108 108 109 105  CO2 26  --  30 33*  GLUCOSE 112* 110* 94 82  BUN 34* 37* 35* 36*  CREATININE 1.32* 1.50* 1.56* 1.66*  CALCIUM  9.3  --  9.0 9.0   GFR: Estimated Creatinine Clearance: 26.9 mL/min (A) (by C-G formula based on SCr of 1.66 mg/dL (H)). Liver Function Tests: Recent Labs  Lab 06/05/24 1325 06/06/24 0318 06/07/24 0254  AST 41 38 28  ALT 27 30 23   ALKPHOS 127* 118 117  BILITOT 0.6 0.6 0.8  PROT 6.7 5.8* 6.0*  ALBUMIN 3.8 3.5 3.5   No results for input(s): LIPASE, AMYLASE in the last 168 hours. No results for input(s): AMMONIA in the last 168 hours. Coagulation Profile: No results for input(s): INR, PROTIME in the last 168 hours. Cardiac Enzymes: No results for input(s): CKTOTAL, CKMB, CKMBINDEX, TROPONINI in the last 168 hours. BNP (last 3 results) Recent Labs    06/05/24 1325  PROBNP 1,474.0*   HbA1C: No results for input(s): HGBA1C in the last 72 hours. CBG: No results for input(s): GLUCAP in the last 168 hours. Lipid Profile: No results for input(s): CHOL, HDL, LDLCALC, TRIG, CHOLHDL, LDLDIRECT in the last 72 hours. Thyroid Function Tests: Recent Labs    06/05/24 2300 06/06/24 0318  TSH 4.820*  --   FREET4  --  1.16  T3FREE  --  2.5   Anemia Panel: Recent Labs    06/06/24 0318  VITAMINB12 602  FOLATE 7.6   Urine analysis:    Component Value Date/Time   COLORURINE YELLOW 06/05/2024 1558   APPEARANCEUR CLEAR 06/05/2024 1558   LABSPEC 1.020 06/05/2024 1558   PHURINE 5.0 06/05/2024 1558   GLUCOSEU NEGATIVE 06/05/2024 1558   HGBUR NEGATIVE 06/05/2024 1558   BILIRUBINUR NEGATIVE 06/05/2024 1558   KETONESUR NEGATIVE  06/05/2024 1558   PROTEINUR 100 (A) 06/05/2024 1558   NITRITE NEGATIVE 06/05/2024 1558   LEUKOCYTESUR NEGATIVE 06/05/2024 1558   Sepsis Labs: @LABRCNTIP (procalcitonin:4,lacticidven:4)  ) Recent Results (from the past 240 hours)  Blood culture (routine x 2)     Status: None (Preliminary result)   Collection Time: 06/05/24  2:17 PM   Specimen: BLOOD  Result Value Ref Range Status   Specimen Description BLOOD LEFT ANTECUBITAL  Final   Special Requests   Final    BOTTLES DRAWN AEROBIC AND ANAEROBIC Blood Culture adequate volume   Culture   Final    NO GROWTH 2 DAYS Performed at [facility], [address], [place], [place] [address]    Report Status PENDING  Incomplete  Blood culture (routine x 2)     Status: None (Preliminary result)   Collection Time: 06/05/24  2:22 PM   Specimen: BLOOD  Result Value Ref Range Status   Specimen Description BLOOD RIGHT ANTECUBITAL  Final  Special Requests   Final    BOTTLES DRAWN AEROBIC AND ANAEROBIC Blood Culture adequate volume   Culture  Setup Time   Final    GRAM POSITIVE COCCI IN CLUSTERS ANAEROBIC BOTTLE ONLY PHARMD RONAL BOONE [id number] @ 1946 FH Performed at [facility], [address] [place], [place] [address]    Culture GRAM POSITIVE COCCI  Final   Report Status PENDING  Incomplete  Blood Culture ID Panel (Reflexed)     Status: None   Collection Time: 06/05/24  2:22 PM  Result Value Ref Range Status   Enterococcus faecalis NOT DETECTED NOT DETECTED Final   Enterococcus Faecium NOT DETECTED NOT DETECTED Final   Listeria monocytogenes NOT DETECTED NOT DETECTED Final   Staphylococcus species NOT DETECTED NOT DETECTED Final   Staphylococcus aureus (BCID) NOT DETECTED NOT DETECTED Final   Staphylococcus epidermidis NOT DETECTED NOT DETECTED Final   Staphylococcus lugdunensis NOT DETECTED NOT DETECTED Final   Streptococcus species NOT DETECTED NOT DETECTED Final   Streptococcus agalactiae NOT DETECTED NOT DETECTED Final    Streptococcus pneumoniae NOT DETECTED NOT DETECTED Final   Streptococcus pyogenes NOT DETECTED NOT DETECTED Final   A.calcoaceticus-baumannii NOT DETECTED NOT DETECTED Final   Bacteroides fragilis NOT DETECTED NOT DETECTED Final   Enterobacterales NOT DETECTED NOT DETECTED Final   Enterobacter cloacae complex NOT DETECTED NOT DETECTED Final   Escherichia coli NOT DETECTED NOT DETECTED Final   Klebsiella aerogenes NOT DETECTED NOT DETECTED Final   Klebsiella oxytoca NOT DETECTED NOT DETECTED Final   Klebsiella pneumoniae NOT DETECTED NOT DETECTED Final   Proteus species NOT DETECTED NOT DETECTED Final   Salmonella species NOT DETECTED NOT DETECTED Final   Serratia marcescens NOT DETECTED NOT DETECTED Final   Haemophilus influenzae NOT DETECTED NOT DETECTED Final   Neisseria meningitidis NOT DETECTED NOT DETECTED Final   Pseudomonas aeruginosa NOT DETECTED NOT DETECTED Final   Stenotrophomonas maltophilia NOT DETECTED NOT DETECTED Final   Candida albicans NOT DETECTED NOT DETECTED Final   Candida auris NOT DETECTED NOT DETECTED Final   Candida glabrata NOT DETECTED NOT DETECTED Final   Candida krusei NOT DETECTED NOT DETECTED Final   Candida parapsilosis NOT DETECTED NOT DETECTED Final   Candida tropicalis NOT DETECTED NOT DETECTED Final   Cryptococcus neoformans/gattii NOT DETECTED NOT DETECTED Final    Comment: Performed at [facility], [address] [place], [place] [address]  Resp panel by RT-PCR (RSV, Flu A&B, Covid)     Status: None   Collection Time: 06/05/24  3:36 PM   Specimen: Nasal Swab  Result Value Ref Range Status   SARS Coronavirus 2 by RT PCR NEGATIVE NEGATIVE Final   Influenza A by PCR NEGATIVE NEGATIVE Final   Influenza B by PCR NEGATIVE NEGATIVE Final    Comment: (NOTE) The Xpert Xpress SARS-CoV-2/FLU/RSV plus assay is intended as an aid in the diagnosis of influenza from Nasopharyngeal swab specimens and should not be used as a sole basis for  treatment. Nasal washings and aspirates are unacceptable for Xpert Xpress SARS-CoV-2/FLU/RSV testing.  Fact Sheet for Patients: bloggercourse.com  Fact Sheet for Healthcare Providers: seriousbroker.it  This test is not yet approved or cleared by the [place]  FDA and has been authorized for detection and/or diagnosis of SARS-CoV-2 by FDA under an Emergency Use Authorization (EUA). This EUA will remain in effect (meaning this test can be used) for the duration of the COVID-19 declaration under Section 564(b)(1) of the Act, 21 U.S.C. section 360bbb-3(b)(1), unless  the authorization is terminated or revoked.     Resp Syncytial Virus by PCR NEGATIVE NEGATIVE Final    Comment: (NOTE) Fact Sheet for Patients: bloggercourse.com  Fact Sheet for Healthcare Providers: seriousbroker.it  This test is not yet approved or cleared by the [place]  FDA and has been authorized for detection and/or diagnosis of SARS-CoV-2 by FDA under an Emergency Use Authorization (EUA). This EUA will remain in effect (meaning this test can be used) for the duration of the COVID-19 declaration under Section 564(b)(1) of the Act, 21 U.S.C. section 360bbb-3(b)(1), unless the authorization is terminated or revoked.  Performed at [facility], [address], [place], [place] [address]      Radiology Studies: CT Angio Chest PE W and/or Wo Contrast Result Date: 06/05/2024 EXAM: CTA CHEST PE WITH CONTRAST CT ABDOMEN AND PELVIS WITH CONTRAST 06/05/2024 05:03:11 PM TECHNIQUE: CTA of the chest was performed after the administration of intravenous contrast. Multiplanar reformatted images are provided for review. MIP images are provided for review. CT of the abdomen and pelvis was performed with the administration of intravenous contrast. 75 mL of iohexol  (OMNIPAQUE ) 350 MG/ML injection was administered.  Automated exposure control, iterative reconstruction, and/or weight based adjustment of the mA/kV was utilized to reduce the radiation dose to as low as reasonably achievable. COMPARISON: None available. CLINICAL HISTORY: Shortness of breath. Acute nonlocalized abdominal pain. FINDINGS: CHEST: PULMONARY ARTERIES: Pulmonary arteries are adequately opacified for evaluation. No intraluminal filling defect to suggest pulmonary embolism. Main pulmonary artery is normal in caliber. MEDIASTINUM: No mediastinal lymphadenopathy. Cardiac enlargement. Calcification of the thoracic aorta and coronary arteries. No thoracic aortic aneurysm. LUNGS AND PLEURA: Moderate bilateral pleural effusions. Atelectasis or pneumonia in the lung bases. No pneumothorax. SOFT TISSUES AND BONES: Degenerative changes in the thoracic spine. No acute soft tissue abnormality. ABDOMEN AND PELVIS: LIVER: No focal liver lesions. GALLBLADDER AND BILE DUCTS: Mild pericholecystic edema is likely related to ascites. No biliary ductal dilatation. SPLEEN: Spleen demonstrates no acute abnormality. PANCREAS: Circumscribed cystic lesion in the junction of the head and body of the pancreas measuring 2 cm in diameter. No pancreatic ductal dilatation or stranding. This may represent IPMN. ADRENAL GLANDS: Adrenal glands demonstrate no acute abnormality. KIDNEYS, URETERS AND BLADDER: Kidneys are atrophic. Nephrograms are homogeneous. No solid masses. No hydronephrosis or hydroureter. No stones in the kidneys or ureters. No perinephric or periureteral stranding. The bladder is decompressed with a foley catheter in place. GI AND BOWEL: The stomach, small bowel, and colon are not abnormally distended. No wall thickening or inflammatory stranding is noted. There is no bowel obstruction. REPRODUCTIVE: The prostate gland is enlarged. PERITONEUM AND RETROPERITONEUM: Small amount of free fluid in the abdomen and pelvis. No free air. LYMPH NODES: No significant  lymphadenopathy. BONES AND SOFT TISSUES: Spondylolysis with moderate spondylolisthesis at L5-S1. Degenerative changes throughout the spine. Edema in the subcutaneous fat of the lower abdomen and pelvis. No acute abnormality of the visualized bones. No focal soft tissue abnormality. VASCULATURE: Of the abdominal aorta. No aneurysm. IMPRESSION: 1. No evidence of pulmonary embolism. 2. Moderate bilateral pleural effusions with atelectasis or pneumonia in the lung bases. No pneumothorax. 3. Cardiomegaly. 4. Small amount of free fluid in the abdomen and pelvis, likely ascites. 5. Circumscribed 2 cm cystic lesion at the junction of the head and body of the pancreas, possibly representing IPMN. Given age, no further surveillance is recommended in the absence of worrisome features. Electronically signed by: Elsie Gravely MD 06/05/2024 05:45 PM EST RP  Workstation: [id number]   CT ABDOMEN PELVIS W CONTRAST Result Date: 06/05/2024 EXAM: CTA CHEST PE WITH CONTRAST CT ABDOMEN AND PELVIS WITH CONTRAST 06/05/2024 05:03:11 PM TECHNIQUE: CTA of the chest was performed after the administration of intravenous contrast. Multiplanar reformatted images are provided for review. MIP images are provided for review. CT of the abdomen and pelvis was performed with the administration of intravenous contrast. 75 mL of iohexol  (OMNIPAQUE ) 350 MG/ML injection was administered. Automated exposure control, iterative reconstruction, and/or weight based adjustment of the mA/kV was utilized to reduce the radiation dose to as low as reasonably achievable. COMPARISON: None available. CLINICAL HISTORY: Shortness of breath. Acute nonlocalized abdominal pain. FINDINGS: CHEST: PULMONARY ARTERIES: Pulmonary arteries are adequately opacified for evaluation. No intraluminal filling defect to suggest pulmonary embolism. Main pulmonary artery is normal in caliber. MEDIASTINUM: No mediastinal lymphadenopathy. Cardiac enlargement. Calcification of the  thoracic aorta and coronary arteries. No thoracic aortic aneurysm. LUNGS AND PLEURA: Moderate bilateral pleural effusions. Atelectasis or pneumonia in the lung bases. No pneumothorax. SOFT TISSUES AND BONES: Degenerative changes in the thoracic spine. No acute soft tissue abnormality. ABDOMEN AND PELVIS: LIVER: No focal liver lesions. GALLBLADDER AND BILE DUCTS: Mild pericholecystic edema is likely related to ascites. No biliary ductal dilatation. SPLEEN: Spleen demonstrates no acute abnormality. PANCREAS: Circumscribed cystic lesion in the junction of the head and body of the pancreas measuring 2 cm in diameter. No pancreatic ductal dilatation or stranding. This may represent IPMN. ADRENAL GLANDS: Adrenal glands demonstrate no acute abnormality. KIDNEYS, URETERS AND BLADDER: Kidneys are atrophic. Nephrograms are homogeneous. No solid masses. No hydronephrosis or hydroureter. No stones in the kidneys or ureters. No perinephric or periureteral stranding. The bladder is decompressed with a foley catheter in place. GI AND BOWEL: The stomach, small bowel, and colon are not abnormally distended. No wall thickening or inflammatory stranding is noted. There is no bowel obstruction. REPRODUCTIVE: The prostate gland is enlarged. PERITONEUM AND RETROPERITONEUM: Small amount of free fluid in the abdomen and pelvis. No free air. LYMPH NODES: No significant lymphadenopathy. BONES AND SOFT TISSUES: Spondylolysis with moderate spondylolisthesis at L5-S1. Degenerative changes throughout the spine. Edema in the subcutaneous fat of the lower abdomen and pelvis. No acute abnormality of the visualized bones. No focal soft tissue abnormality. VASCULATURE: Of the abdominal aorta. No aneurysm. IMPRESSION: 1. No evidence of pulmonary embolism. 2. Moderate bilateral pleural effusions with atelectasis or pneumonia in the lung bases. No pneumothorax. 3. Cardiomegaly. 4. Small amount of free fluid in the abdomen and pelvis, likely ascites. 5.  Circumscribed 2 cm cystic lesion at the junction of the head and body of the pancreas, possibly representing IPMN. Given age, no further surveillance is recommended in the absence of worrisome features. Electronically signed by: Elsie Gravely MD 06/05/2024 05:45 PM EST RP Workstation: [id number]   DG Pelvis Portable Result Date: 06/05/2024 EXAM: 1 or 2 VIEW(S) XRAY OF THE PELVIS 06/05/2024 03:52:00 PM COMPARISON: CT pelvis 08/16/2019. CLINICAL HISTORY: Loss of ambulation, altered mental status. FINDINGS: BONES AND JOINTS: Cortical irregularity over the right greater trochanter possibly representing nondisplaced fracture. Rotation of the right hip limits evaluation. Recommend dedicated right hip views for further evaluation. Degenerative changes in the lower lumbar spine and bilateral hips. SOFT TISSUES: Unremarkable. LINES AND TUBES: Catheter overlying the pelvis. IMPRESSION: 1. Cortical irregularity over the right greater trochanter, possibly representing a nondisplaced fracture; rotation of the right hip limits evaluation. Dedicated right hip views are recommended for further evaluation. 2. Degenerative changes in the lower lumbar spine and  bilateral hips. Electronically signed by: Elsie Gravely MD 06/05/2024 04:26 PM EST RP Workstation: [id number]   CT Head Wo Contrast EXAM: CT HEAD WITHOUT CONTRAST 06/05/2024 02:05:25 PM TECHNIQUE: CT of the head was performed without the administration of intravenous contrast. Automated exposure control, iterative reconstruction, and/or weight based adjustment of the mA/kV was utilized to reduce the radiation dose to as low as reasonably achievable. COMPARISON: None available. CLINICAL HISTORY: Mental status change, unknown cause. FINDINGS: BRAIN AND VENTRICLES: There is no evidence of an acute cortically based infarct, intracranial hemorrhage, mass, midline shift, hydrocephalus, or extra-axial fluid collection. Lacunar infarcts are present in the basal ganglia and  thalami bilaterally, some of which are chronic in appearance while others are of indeterminate age. Patchy hypodensities in the cerebral white matter bilaterally are nonspecific but compatible with moderate chronic small vessel ischemic disease. Cerebral atrophy is mild for age. Calcified atherosclerosis at the skull base. ORBITS: No acute abnormality. SINUSES: No acute abnormality. SOFT TISSUES AND SKULL: No acute soft tissue abnormality. No skull fracture. IMPRESSION: 1. No evidence of an acute cortical infarct or intracranial hemorrhage. 2. Moderate chronic small vessel ischemic disease with age-indeterminate lacunar infarcts in the deep ganglia. Electronically signed by: Dasie Hamburg MD 06/05/2024 03:45 PM EST RP Workstation: [id number]   DG Chest Portable 1 View Result Date: 06/05/2024 CLINICAL DATA:  Shortness of breath. Altered mental status. Weakness. EXAM: PORTABLE CHEST - 1 VIEW COMPARISON:  08/16/2019 FINDINGS: Heart is mildly enlarged. Mild pulmonary vascular congestion. Mediastinal silhouette otherwise within normal limits. Bibasilar opacities likely a combination of pulmonary edema, atelectasis, and small pleural effusions. Advanced degenerative changes of the LEFT glenohumeral joint. IMPRESSION: Radiographic findings most consistent with CHF. Electronically Signed   By: Aliene Lloyd M.D.   On: 06/05/2024 14:49     Scheduled Meds:  apixaban   5 mg Oral BID   atorvastatin   40 mg Oral Daily   Chlorhexidine  Gluconate Cloth  6 each Topical Daily   furosemide   80 mg Intravenous BID   metoprolol  succinate  50 mg Oral Daily   sodium chloride  flush  3 mL Intravenous Q12H   tamsulosin   0.4 mg Oral QPC supper   Continuous Infusions:   LOS: 2 days    Time spent:    Sigurd Pac, MD [facility]   06/07/2024, 11:38 AM    "

## 2024-06-07 NOTE — Progress Notes (Signed)
 Heart Failure Navigator Progress Note  Assessed for Heart & Vascular TOC clinic readiness.  Patient does not meet criteria due to EF 55-60%, per Dr. Fairy patient with decline in cognitive and physical . No HF TOC . SABRA   Navigator will sign off at this time.   Stephane Haddock, BSN, Scientist, Clinical (histocompatibility And Immunogenetics) Only

## 2024-06-07 NOTE — [place] FL2 (Cosign Needed)
 " [place]  MEDICAID FL2 LEVEL OF CARE FORM     IDENTIFICATION  Patient Name: Michael Haas Birthdate: 26-Apr-1929 Sex: male Admission Date (Current Location): 06/05/2024  [facility] and [place] Number:  Producer, Television/film/video and Address:  [facility] [place], [address] [place], [place] [address]      Provider Number: [id number]  Attending Physician Name and Address:  Fairy Frames, MD  Relative Name and Phone Number:  Hinch,Frances  Spouse, Emergency Contact  [phone]    Current Level of Care: Hospital Recommended Level of Care: Skilled Nursing Facility Prior Approval Number:    Date Approved/Denied:   PASRR Number: [id number]  Discharge Plan: SNF    Current Diagnoses: Patient Active Problem List   Diagnosis Date Noted   Hypothermia 06/05/2024   Acute encephalopathy 06/05/2024   Encephalopathy acute 06/05/2024   Chronic venous insufficiency 09/17/2022   Chronic combined systolic and diastolic heart failure (HCC) 09/17/2022   Essential hypertension 09/17/2022   OSA (obstructive sleep apnea) 07/01/2021   Stage 3b chronic kidney disease (HCC) 09/19/2019   Acute respiratory failure with hypoxia (HCC) 08/16/2019   Unspecified atrial fibrillation (HCC) 08/16/2019   Venous stasis ulcer (HCC) 08/16/2019   Advanced age 89/29[age]2020    Orientation RESPIRATION BLADDER Height & Weight     Self, Place  Normal Continent, Indwelling catheter Weight: 164 lb 10.9 oz (74.7 kg) Height:  5' 8 (172.7 cm)  BEHAVIORAL SYMPTOMS/MOOD NEUROLOGICAL BOWEL NUTRITION STATUS      Continent Diet (Please see dc summary)  AMBULATORY STATUS COMMUNICATION OF NEEDS Skin   Extensive Assist Verbally Normal                       Personal Care Assistance Level of Assistance  Bathing, Feeding, Dressing Bathing Assistance:  (Please see dc summary) Feeding assistance:  (Please see dc summary) Dressing Assistance:  (Please see dc summary)     Functional Limitations Info   Sight, Hearing, Speech Sight Info: Adequate Hearing Info: Adequate Speech Info: Adequate    SPECIAL CARE FACTORS FREQUENCY  PT (By licensed PT), OT (By licensed OT)     PT Frequency: 5x week OT Frequency: 5x week            Contractures Contractures Info: Not present    Additional Factors Info  Code Status, Allergies Code Status Info: DNR limited Allergies Info: NKA           Current Medications (06/07/2024):  This is the current hospital active medication list Current Facility-Administered Medications  Medication Dose Route Frequency Provider Last Rate Last Admin   acetaminophen  (TYLENOL ) tablet 500 mg  500 mg Oral Q6H PRN Arthea Child, MD       Or   acetaminophen  (TYLENOL ) suppository 650 mg  650 mg Rectal Q6H PRN Arthea Child, MD       albuterol  (PROVENTIL ) (2.5 MG/3ML) 0.083% nebulizer solution 2.5 mg  2.5 mg Nebulization Q2H PRN Arthea Child, MD       apixaban  (ELIQUIS ) tablet 5 mg  5 mg Oral BID Claiborne, Claudia, MD   5 mg at 06/07/24 9071   atorvastatin  (LIPITOR) tablet 40 mg  40 mg Oral Daily Claiborne, Claudia, MD   40 mg at 06/07/24 9071   bisacodyl  (DULCOLAX) EC tablet 5 mg  5 mg Oral Daily PRN Claiborne, Claudia, MD   5 mg at 06/07/24 1444   Chlorhexidine  Gluconate Cloth 2 % PADS 6 each  6 each Topical Daily Joseph, Preetha,  MD   6 each at 06/07/24 0929   furosemide  (LASIX ) injection 80 mg  80 mg Intravenous BID Joseph, Preetha, MD   80 mg at 06/07/24 9072   melatonin tablet 3 mg  3 mg Oral QHS PRN Claiborne, Claudia, MD       metoprolol  succinate (TOPROL -XL) 24 hr tablet 50 mg  50 mg Oral Daily Joseph, Preetha, MD   50 mg at 06/07/24 9071   ondansetron  (ZOFRAN ) tablet 4 mg  4 mg Oral Q6H PRN Arthea Child, MD       Or   ondansetron  (ZOFRAN ) injection 4 mg  4 mg Intravenous Q6H PRN Arthea Child, MD       potassium chloride  SA (KLOR-CON  M) CR tablet 40 mEq  40 mEq Oral BID Fairy Frames, MD   40 mEq at 06/07/24 1443   sodium  chloride flush (NS) 0.9 % injection 3 mL  3 mL Intravenous Q12H Arthea Child, MD   3 mL at 06/07/24 9071   tamsulosin  (FLOMAX ) capsule 0.4 mg  0.4 mg Oral QPC supper Fairy Frames, MD   0.4 mg at 06/06/24 1906     Discharge Medications: Please see discharge summary for a list of discharge medications.  Relevant Imaging Results:  Relevant Lab Results:   Additional Information SSN [id number]  Luise JAYSON Pan, LCSWA     "

## 2024-06-07 NOTE — Consult Note (Signed)
 "                                                  Palliative Care Consult Note                                  Date: 06/07/2024   Patient Name: Michael Haas  DOB: 27-Oct-1928  MRN: [id number]  Age / Sex: 89 y.o., male  PCP: Pcp, No Referring Physician: Fairy Frames, MD  Reason for Consultation: Establishing goals of care  HPI/Patient Profile: 89 y.o. male  with past medical history of chronic combined CHF, paroxysmal A-fib on Eliquis , chronic venous stasis ulcers, BPH, OSA on CPAP, HTN, and HLD.  He presented to the ED on 06/05/2024 with confusion for 3 days and mild dyspnea.  He was found to be hypothermic, placed on a Bair hugger with subsequent improvement in temperature. He is admitted with acute metabolic encephalopathy, acute on chronic CHF, RV failure, and pulmonary hypertension.  Palliative Medicine has been consulted for goals of care discussions and complex medical decision making.   Clinical Assessment and Goals of Care:   Extensive chart review has been completed including labs, vital signs, imaging, progress/consult notes, orders, medications and available advance directive documents.    Patient is out of bed to the recliner, eating lunch. He denies shortness of breath. He is interactive and able to answer questions appropriately, but is not able to verbalize why he is in the hospital. He wants to go home.   I spoke with his wife Michael Haas by phone to discuss diagnosis, prognosis, GOC, disposition, and options.  I introduced Palliative Medicine as specialized medical care for people living with serious illness. It focuses on providing relief from the symptoms and stress of a serious illness.   Created space and opportunity for patient and wife to express thoughts and feelings regarding current medical situation. Values and goals of care were attempted to be elicited.  Social History: Patient and wife have been married for 50+ years. They do not have children together, but  wife has a son/Michael Haas from her previous marriage. Although Tull lives in [place], he is not able to help them as he is a caregiver for his wife. They do no have any other family support.   Functional Status: Patient lives with his wife at their home in [place]. Prior to admission, patient used a rollator for mobility within the house and community distances.  He is moderately independent with basic ADLs.  Discussion: We discussed patient's current illness and what it means in the larger context of his ongoing co-morbidities. Current clinical status was reviewed.  Discussed the natural trajectory of heart failure as a progressive and life-limiting illness.  Michael Haas expresses significant concern with her ability to continue caring for patient at home. She shares that she has difficulty even caring for herself in the setting of her chronic medical issues (including heart failure). She expresses frustration that patient is stubborn and non-compliant with medications.   We discussed current recommendations from PT/OT are for short-term rehab. Michael Haas is agreeable to rehab and would prefer [facility], as it is near their home.   We discussed the difference between current scope of care (limited interventions, treat the treatable) versus comfort care. I encouraged  Michael Haas to consider the addition of hospice support when patient returns home from rehab. I explained that hospice would provide personal care for patient and assistance with symptom management when he further declines. Michael Haas herself is receptive to hospice, but states that patient is not. She states they would have to avoid using the word hospice.  For now, she is agreeable to outpatient palliative follow-up to provide ongoing support around goals of care.   Discussed the importance of continued conversation with the medical team regarding overall plan of care. Questions and concerns addressed. Emotional support provided.     Review of Systems  Respiratory:  Negative for shortness of breath.     Objective:   Primary Diagnoses: Present on Admission:  Hypothermia  OSA (obstructive sleep apnea)  Chronic venous insufficiency  Essential hypertension  Acute encephalopathy  Encephalopathy acute   Physical Exam Vitals reviewed.  Constitutional:      General: He is not in acute distress.    Comments: Chronically ill-appearing  Pulmonary:     Effort: Pulmonary effort is normal.     Comments: On room air Neurological:     Mental Status: He is alert.     Comments: Oriented to person and place  Psychiatric:        Cognition and Memory: Cognition is impaired. Memory is impaired.     Palliative Assessment/Data: PPS 40-50%     Assessment & Plan:   SUMMARY OF RECOMMENDATIONS   DNR/DNI Continue current scope of care (treat the treatable) Goal is for rehab to improve functional status Outpatient palliative referral  PMT will continue to follow  Primary Decision Maker: NEXT OF KIN - wife/Michael Haas  Existing Vynca/ACP Documentation: None   Prognosis:  Unable to determine  Discharge Planning:  Skilled Nursing Facility for rehab with Palliative care service follow-up    Thank you for allowing us  to participate in the care of Michael Haas  I personally spent a total of 78 minutes in the care of the patient today including preparing to see the patient, getting/reviewing separately obtained history, performing a medically appropriate exam/evaluation, counseling and educating, referring and communicating with other health care professionals, and documenting clinical information in the EHR.   Signed by: Recardo Loll, NP Palliative Medicine Team  Team Phone # [phone]  For individual providers, please see AMION                "

## 2024-06-07 NOTE — Evaluation (Signed)
 Physical Therapy Evaluation Patient Details Name: Michael Haas MRN: [id number] DOB: 30-Sep-1928 Today's Date: 06/07/2024  History of Present Illness  Pt is a 89 y.o. M presenting to [facility] on [facility]/05/24 with SOB and AMS. Pt admitted with acute encephalopathy and hypothermia. PMH is significant for CHF, HLD, HTN.  Clinical Impression  Prior to admission, pt reports modified independence with mobility using a rollator both for household and community level distances, and is mod I with ADLs. Pt presents to evaluation with deficits in mobility, strength, power, activity tolerance, and balance. Pt was able to perform bed mobility with moderate physical assistance, complete sit to stand with AD and moderate physical assistance, and transfer to the recliner with AD and minimal physical assistance. PT will continue to treat pt while he is admitted. Patient will benefit from continued inpatient follow up therapy, <3 hours/day.       If plan is discharge home, recommend the following: A little help with walking and/or transfers;A little help with bathing/dressing/bathroom;Assistance with cooking/housework;Assist for transportation;Help with stairs or ramp for entrance   Can travel by private vehicle   No    Equipment Recommendations None recommended by PT  Recommendations for Other Services  OT consult    Functional Status Assessment Patient has had a recent decline in their functional status and demonstrates the ability to make significant improvements in function in a reasonable and predictable amount of time.     Precautions / Restrictions Precautions Precautions: Fall Recall of Precautions/Restrictions: Intact Restrictions Weight Bearing Restrictions Per Provider Order: No      Mobility  Bed Mobility Overal bed mobility: Needs Assistance Bed Mobility: Supine to Sit     Supine to sit: Mod assist     General bed mobility comments: Supine to sit on L side of bed with pt needing physical  assistance to abduct LLE to EOB but is able to adducted RLE to opposite limb. Pt then requires moderate physical assistance for trunk management. Once seated EOB, pt able to brace BUE on EOB and advance hips forward for foot flat position.    Transfers Overall transfer level: Needs assistance Equipment used: Rolling walker (2 wheels) Transfers: Sit to/from Stand, Bed to chair/wheelchair/BSC Sit to Stand: Mod assist   Step pivot transfers: Min assist       General transfer comment: Pt completed STS from EOB with RW and moderate physical assistance for intial power up with VC for UE placement and physical facilitation of increased L knee flexion. Pt then completed stand-step transfer to recliner on the L with RW and minimal physical assistance. VC for sequencing. Increased time to complete.    Ambulation/Gait                  Stairs            Wheelchair Mobility     Tilt Bed    Modified Rankin (Stroke Patients Only)       Balance Overall balance assessment: Needs assistance Sitting-balance support: No upper extremity supported, Feet supported Sitting balance-Leahy Scale: Fair Sitting balance - Comments: seated EOB for strength assessment   Standing balance support: Bilateral upper extremity supported, During functional activity, Reliant on assistive device for balance Standing balance-Leahy Scale: Poor Standing balance comment: reliant on external support for optimal stability                             Pertinent Vitals/Pain Pain Assessment Pain Assessment: No/denies pain  Home Living Family/patient expects to be discharged to:: Private residence Living Arrangements: Spouse/significant other Available Help at Discharge: Family;Available PRN/intermittently Type of Home: House Home Access: Stairs to enter Entrance Stairs-Rails: Right Entrance Stairs-Number of Steps: 1   Home Layout: One level Home Equipment: Agricultural Consultant (2  wheels);Rollator (4 wheels);Toilet riser      Prior Function Prior Level of Function : Independent/Modified Independent             Mobility Comments: mod I using rollator for all mobility ADLs Comments: mod I. performs sponge baths at baseline     Extremity/Trunk Assessment   Upper Extremity Assessment Upper Extremity Assessment: Generalized weakness    Lower Extremity Assessment Lower Extremity Assessment: Generalized weakness;LLE deficits/detail LLE Deficits / Details: hip flexion 2+/5, knee extension 4/5, hip abduction 2+/5    Cervical / Trunk Assessment Cervical / Trunk Assessment: Kyphotic  Communication   Communication Communication: Impaired Factors Affecting Communication: Hearing impaired    Cognition Arousal: Alert Behavior During Therapy: WFL for tasks assessed/performed   PT - Cognitive impairments: No apparent impairments                         Following commands: Intact       Cueing Cueing Techniques: Verbal cues, Gestural cues     General Comments General comments (skin integrity, edema, etc.): VSS on RA    Exercises     Assessment/Plan    PT Assessment Patient needs continued PT services  PT Problem List Decreased strength;Decreased range of motion;Decreased activity tolerance;Decreased balance;Decreased mobility;Decreased coordination       PT Treatment Interventions DME instruction;Gait training;Stair training;Functional mobility training;Therapeutic activities;Therapeutic exercise;Patient/family education;Manual techniques;Modalities    PT Goals (Current goals can be found in the Care Plan section)  Acute Rehab PT Goals Patient Stated Goal: to go home PT Goal Formulation: With patient Time For Goal Achievement: 06/21/24 Potential to Achieve Goals: Good    Frequency Min 2X/week     Co-evaluation               AM-PAC PT 6 Clicks Mobility  Outcome Measure Help needed turning from your back to your side  while in a flat bed without using bedrails?: A Lot Help needed moving from lying on your back to sitting on the side of a flat bed without using bedrails?: A Lot Help needed moving to and from a bed to a chair (including a wheelchair)?: A Little Help needed standing up from a chair using your arms (e.g., wheelchair or bedside chair)?: A Lot Help needed to walk in hospital room?: Total Help needed climbing 3-5 steps with a railing? : Total 6 Click Score: 11    End of Session Equipment Utilized During Treatment: Gait belt Activity Tolerance: Patient tolerated treatment well Patient left: in chair;with call bell/phone within reach;with chair alarm set Nurse Communication: Mobility status PT Visit Diagnosis: Unsteadiness on feet (R26.81);Other abnormalities of gait and mobility (R26.89);Muscle weakness (generalized) (M62.81);Difficulty in walking, not elsewhere classified (R26.2)    Time: 8870-8851 PT Time Calculation (min) (ACUTE ONLY): 19 min   Charges:   PT Evaluation $PT Eval Low Complexity: 1 Low   PT General Charges $$ ACUTE PT VISIT: 1 Visit         Leontine Hilt DPT Acute Rehab Services [phone] Prefer contact via chat   Leontine NOVAK Tawnee Clegg 06/07/2024, 1:09 PM

## 2024-06-07 NOTE — TOC Initial Note (Signed)
 Transition of Care [facility]) - Initial/Assessment Note    Patient Details  Name: Michael Haas MRN: [id number] Date of Birth: 1929/05/03  Transition of Care [facility]) CM/SW Contact:    Luise JAYSON Pan, LCSWA Phone Number: 06/07/2024, 3:28 PM  Clinical Narrative:    CSW followed up with patients spouse about PT rec for SNF. Cathlean would like to see if patient can go back to [facility] in [place]. CSW will send referral. CSW asked if it was okay for referral to be faxed to other places. Spouse agreed.  CSW will continue to follow.        Expected Discharge Plan: Skilled Nursing Facility Barriers to Discharge: Continued Medical Work up, SNF Pending bed offer, Insurance Authorization   Patient Goals and CMS Choice Patient states their goals for this hospitalization and ongoing recovery are:: To go to [facility] CMS Medicare.gov Compare Post Acute Care list provided to:: Other (Comment Required) (Spouse) Choice offered to / list presented to : Spouse [place] ownership interest in [facility]provided to:: Spouse    Expected Discharge Plan and Services In-house Referral: Clinical Social Work   Post Acute Care Choice: Skilled Nursing Facility Living arrangements for the past 2 months: Single Family Home                                      Prior Living Arrangements/Services Living arrangements for the past 2 months: Single Family Home Lives with:: Self, Spouse Patient language and need for interpreter reviewed:: Yes Do you feel safe going back to the place where you live?: Yes      Need for Family Participation in Patient Care: Yes (Comment) Care giver support system in place?: Yes (comment)   Criminal Activity/Legal Involvement Pertinent to Current Situation/Hospitalization: No - Comment as needed  Activities of Daily Living   ADL Screening (condition at time of admission) Independently performs ADLs?: No Does the patient have a NEW difficulty with  bathing/dressing/toileting/self-feeding that is expected to last >3 days?: No Does the patient have a NEW difficulty with getting in/out of bed, walking, or climbing stairs that is expected to last >3 days?: No Does the patient have a NEW difficulty with communication that is expected to last >3 days?: No Is the patient deaf or have difficulty hearing?: No Does the patient have difficulty seeing, even when wearing glasses/contacts?: No Does the patient have difficulty concentrating, remembering, or making decisions?: No  Permission Sought/Granted Permission sought to share information with : Facility Medical Sales Representative, Family Supports Permission granted to share information with : No (Family contact info in chart)  Share Information with NAME: Antonetti,Frances  Permission granted to share info w AGENCY: SNFs  Permission granted to share info w Relationship: Spouse, Emergency Contact  Permission granted to share info w Contact Information: [phone]  Emotional Assessment Appearance:: Appears stated age Attitude/Demeanor/Rapport: Unable to Assess Affect (typically observed): Unable to Assess Orientation: : Oriented to Self, Oriented to Place Alcohol / Substance Use: Not Applicable Psych Involvement: No (comment)  Admission diagnosis:  Encephalopathy acute [G93.40] Hypothermia, initial encounter [T68.XXXA] Acute decompensated heart failure (HCC) [I50.9] Altered mental status, unspecified altered mental status type [R41.82] Patient Active Problem List   Diagnosis Date Noted   Hypothermia 06/05/2024   Acute encephalopathy 06/05/2024   Encephalopathy acute 06/05/2024   Chronic venous insufficiency 09/17/2022   Chronic combined systolic and diastolic heart failure (HCC) 09/17/2022   Essential hypertension  09/17/2022   OSA (obstructive sleep apnea) 07/01/2021   Stage 3b chronic kidney disease (HCC) 09/19/2019   Acute respiratory failure with hypoxia (HCC) 08/16/2019   Unspecified  atrial fibrillation (HCC) 08/16/2019   Venous stasis ulcer (HCC) 08/16/2019   Advanced age 44/29[age]2020   PCP:  Freddrick, No Pharmacy:   [id number] - [place], [place] - [address] [place] [address] Phone: [phone] Fax: [phone]     Social Drivers of Health (SDOH) Social History: SDOH Screenings   Food Insecurity: No Food Insecurity (06/05/2024)  Housing: Low Risk (06/05/2024)  Transportation Needs: No Transportation Needs (06/05/2024)  Utilities: Not At Risk (06/05/2024)  Social Connections: Socially Isolated (06/05/2024)  Tobacco Use: Medium Risk (06/05/2024)   SDOH Interventions: Social Connections Interventions: Community Resources Provided, Inpatient TOC   Readmission Risk Interventions    06/06/2024   12:39 PM  Readmission Risk Prevention Plan  Medication Screening Complete  Transportation Screening Complete

## 2024-06-08 ENCOUNTER — Inpatient Hospital Stay (HOSPITAL_COMMUNITY)

## 2024-06-08 DIAGNOSIS — G9340 Encephalopathy, unspecified: Secondary | ICD-10-CM | POA: Diagnosis not present

## 2024-06-08 LAB — CBC
HCT: 44.3 % (ref 39.0–52.0)
Hemoglobin: 14.9 g/dL (ref 13.0–17.0)
MCH: 33.5 pg (ref 26.0–34.0)
MCHC: 33.6 g/dL (ref 30.0–36.0)
MCV: 99.6 fL (ref 80.0–100.0)
Platelets: 324 10*3/uL (ref 150–400)
RBC: 4.45 MIL/uL (ref 4.22–5.81)
RDW: 16.9 % — ABNORMAL HIGH (ref 11.5–15.5)
WBC: 18.3 10*3/uL — ABNORMAL HIGH (ref 4.0–10.5)
nRBC: 0 % (ref 0.0–0.2)

## 2024-06-08 LAB — CULTURE, BLOOD (ROUTINE X 2): Special Requests: ADEQUATE

## 2024-06-08 LAB — BASIC METABOLIC PANEL WITH GFR
Anion gap: 14 (ref 5–15)
BUN: 36 mg/dL — ABNORMAL HIGH (ref 8–23)
CO2: 31 mmol/L (ref 22–32)
Calcium: 9.2 mg/dL (ref 8.9–10.3)
Chloride: 102 mmol/L (ref 98–111)
Creatinine, Ser: 1.58 mg/dL — ABNORMAL HIGH (ref 0.61–1.24)
GFR, Estimated: 41 mL/min — ABNORMAL LOW
Glucose, Bld: 110 mg/dL — ABNORMAL HIGH (ref 70–99)
Potassium: 3.7 mmol/L (ref 3.5–5.1)
Sodium: 147 mmol/L — ABNORMAL HIGH (ref 135–145)

## 2024-06-08 LAB — MAGNESIUM: Magnesium: 2.3 mg/dL (ref 1.7–2.4)

## 2024-06-08 MED ORDER — FUROSEMIDE 10 MG/ML IJ SOLN
40.0000 mg | Freq: Two times a day (BID) | INTRAMUSCULAR | Status: DC
  Administered 2024-06-08 – 2024-06-09 (×3): 40 mg via INTRAVENOUS
  Filled 2024-06-08 (×3): qty 4

## 2024-06-08 MED ORDER — POTASSIUM CHLORIDE CRYS ER 20 MEQ PO TBCR
40.0000 meq | EXTENDED_RELEASE_TABLET | Freq: Two times a day (BID) | ORAL | Status: AC
  Administered 2024-06-08 (×2): 40 meq via ORAL
  Filled 2024-06-08 (×2): qty 2

## 2024-06-08 NOTE — Progress Notes (Addendum)
 " PROGRESS NOTE    Michael Haas  FMW:[id number] DOB: [birth year] DOA: 06/05/2024 PCP: Pcp, No  89/M with history of chronic combined CHF, paroxysmal A-fib on Eliquis , hypertension, chronic venous stasis ulcers, BPH, poor compliance with medications, had stopped taking all his heart medicines as well as using his CPAP and then saw cardiology few months ago and restarted some of his diuretics then. - Was brought to the ED by EMS 1/27 with confusion for 3 days, mild dyspnea, in the ED he was hypothermic, creatinine was 1.3, sodium 146, BNP 1474, troponin 51, WBC 17, flu COVID and RSV PCR were negative, he was placed under a Bair hugger and subsequently temperature improved and alertness improved as well CT head with moderate chronic small vessel disease CT chest and abdomen pelvis -moderate bilateral effusions, small ascites, 2 cm lesion near pancreatic head, likely IPMN - Foley catheter placed in the ED for urinary retention  Subjective: Feels better today, breathing continues to improve, WBC trending up, denies any symptoms, still has Foley  Assessment and Plan:  Hypothermia Acute metabolic encephalopathy Leukocytosis -Hypothermia and encephalopathy have resolved - TSH minimally elevated, free T4 is normal - No evidence of acute infectious process at this time, UA unremarkable, chest x-ray with effusions, CT abdomen pelvis as noted above - WBC worsening, procalcitonin is low, blood culture with GPC in clusters in 1 out of 4 bottles likely contaminant> will DC Foley catheter today, repeat CBC tonight and in a.m. - Echo with preserved EF  Acute on chronic combined CHF RV failure Pulmonary hypertension - Last echo 4/21 noted EF of 45-50% - Repeat echo now with EF of 55-60%, moderately reduced RV, moderately elevated PASP - Volume status improving, decrease Lasix  to 40 mg twice daily, continue Toprol  - Unna boots, DC amlodipine  - Add GDMT slowly as tolerated - Overall poor prognosis with  cognitive decline, advanced age, frailty, palliative consulted for goals of care> plan for SNF with palliative care and then transition to hospice when he goes home  Memory/cognitive issues -with functional decline  Permanent atrial fibrillation - Continue Toprol  and apixaban   BPH, urinary retention - Foley placed in the ED, continue Flomax  - DC Foley catheter today, worsening leukocytosis, voiding trial - Will need urology follow-up  2 cm pancreatic head/neck lesion - Suspected to be IPMN - Follow-up with PCP, likely low yield to follow this at his age   DVT prophylaxis: Apixaban  Code Status: called and d/w spouse, DNR confirmed Family Communication: No family at bedside , called and updated spouse yesterday Disposition Plan: Home versus rehab  Consultants:    Procedures:   Antimicrobials:    Objective: Vitals:   06/07/24 2345 06/08/24 0420 06/08/24 1005 06/08/24 1055  BP: 114/70 120/85 104/74 115/70  Pulse: 79   84  Resp:  18 19 (!) 25  Temp: (!) 97.3 F (36.3 C) (!) 97.3 F (36.3 C)  (!) 97.4 F (36.3 C)  TempSrc: Oral Oral  Oral  SpO2: 90% 95%  92%  Weight:      Height:        Intake/Output Summary (Last 24 hours) at 06/08/2024 1202 Last data filed at 06/08/2024 1005 Gross per 24 hour  Intake 240 ml  Output 3425 ml  Net -3185 ml   Filed Weights   06/05/24 1933 06/05/24 2149  Weight: 71.7 kg 74.7 kg    Examination:  General exam: Frail chronically ill, AAO x 2, mild cognitive deficits HEENT: Positive JVD Respiratory system and having air movement  Cardiovascular system: S1 & S2 heard, irregular Abd: nondistended, soft and nontender.Normal bowel sounds heard. Extremities: Unna boots on, 1+ edema Skin: No rashes Psychiatry: Flat affect Neuro: Mild cognitive deficits otherwise moves all extremities, no localizing signs    Data Reviewed:   CBC: Recent Labs  Lab 06/05/24 1325 06/05/24 1346 06/06/24 0318 06/07/24 0254 06/08/24 0248  WBC  17.4*  --  13.5* 15.5* 18.3*  HGB 14.5 15.3 12.7* 13.6 14.9  HCT 44.4 45.0 38.3* 41.5 44.3  MCV 103.3*  --  101.1* 101.0* 99.6  PLT 413*  --  359 339 324   Basic Metabolic Panel: Recent Labs  Lab 06/05/24 1325 06/05/24 1346 06/06/24 0318 06/07/24 0254 06/08/24 0248  NA 146* 146* 147* 147* 147*  K 4.6 4.2 4.0 3.4* 3.7  CL 108 108 109 105 102  CO2 26  --  30 33* 31  GLUCOSE 112* 110* 94 82 110*  BUN 34* 37* 35* 36* 36*  CREATININE 1.32* 1.50* 1.56* 1.66* 1.58*  CALCIUM  9.3  --  9.0 9.0 9.2  MG  --   --   --   --  2.3   GFR: Estimated Creatinine Clearance: 28.3 mL/min (A) (by C-G formula based on SCr of 1.58 mg/dL (H)). Liver Function Tests: Recent Labs  Lab 06/05/24 1325 06/06/24 0318 06/07/24 0254  AST 41 38 28  ALT 27 30 23   ALKPHOS 127* 118 117  BILITOT 0.6 0.6 0.8  PROT 6.7 5.8* 6.0*  ALBUMIN 3.8 3.5 3.5   No results for input(s): LIPASE, AMYLASE in the last 168 hours. No results for input(s): AMMONIA in the last 168 hours. Coagulation Profile: No results for input(s): INR, PROTIME in the last 168 hours. Cardiac Enzymes: No results for input(s): CKTOTAL, CKMB, CKMBINDEX, TROPONINI in the last 168 hours. BNP (last 3 results) Recent Labs    06/05/24 1325  PROBNP 1,474.0*   HbA1C: No results for input(s): HGBA1C in the last 72 hours. CBG: No results for input(s): GLUCAP in the last 168 hours. Lipid Profile: No results for input(s): CHOL, HDL, LDLCALC, TRIG, CHOLHDL, LDLDIRECT in the last 72 hours. Thyroid Function Tests: Recent Labs    06/05/24 2300 06/06/24 0318  TSH 4.820*  --   FREET4  --  1.16  T3FREE  --  2.5   Anemia Panel: Recent Labs    06/06/24 0318  VITAMINB12 602  FOLATE 7.6   Urine analysis:    Component Value Date/Time   COLORURINE YELLOW 06/05/2024 1558   APPEARANCEUR CLEAR 06/05/2024 1558   LABSPEC 1.020 06/05/2024 1558   PHURINE 5.0 06/05/2024 1558   GLUCOSEU NEGATIVE 06/05/2024 1558    HGBUR NEGATIVE 06/05/2024 1558   BILIRUBINUR NEGATIVE 06/05/2024 1558   KETONESUR NEGATIVE 06/05/2024 1558   PROTEINUR 100 (A) 06/05/2024 1558   NITRITE NEGATIVE 06/05/2024 1558   LEUKOCYTESUR NEGATIVE 06/05/2024 1558   Sepsis Labs: @LABRCNTIP (procalcitonin:4,lacticidven:4)  ) Recent Results (from the past 240 hours)  Urine Culture     Status: None   Collection Time: 06/05/24  1:29 PM   Specimen: Urine, Clean Catch  Result Value Ref Range Status   Specimen Description URINE, CLEAN CATCH  Final   Special Requests NONE  Final   Culture   Final    NO GROWTH Performed at [facility], [address] [place], [place] [address]    Report Status 06/07/2024 FINAL  Final  Blood culture (routine x 2)     Status: None (Preliminary result)   Collection Time: 06/05/24  2:17 PM   Specimen: BLOOD  Result Value Ref Range Status   Specimen Description BLOOD LEFT ANTECUBITAL  Final   Special Requests   Final    BOTTLES DRAWN AEROBIC AND ANAEROBIC Blood Culture adequate volume   Culture   Final    NO GROWTH 3 DAYS Performed at [facility], [address], [place], [place] [address]    Report Status PENDING  Incomplete  Blood culture (routine x 2)     Status: Abnormal   Collection Time: 06/05/24  2:22 PM   Specimen: BLOOD  Result Value Ref Range Status   Specimen Description BLOOD RIGHT ANTECUBITAL  Final   Special Requests   Final    BOTTLES DRAWN AEROBIC AND ANAEROBIC Blood Culture adequate volume   Culture  Setup Time   Final    GRAM POSITIVE COCCI IN CLUSTERS ANAEROBIC BOTTLE ONLY PHARMD ASABRA BOONE [id number] @ 1946 FH    Culture (A)  Final    AEROCOCCUS SANGUINICOLA Standardized susceptibility testing for this organism is not available. Performed at [facility], [address], [place], [place] [address]    Report Status 06/08/2024 FINAL  Final  Blood Culture ID Panel (Reflexed)     Status: None   Collection Time: 06/05/24  2:22 PM  Result Value Ref Range  Status   Enterococcus faecalis NOT DETECTED NOT DETECTED Final   Enterococcus Faecium NOT DETECTED NOT DETECTED Final   Listeria monocytogenes NOT DETECTED NOT DETECTED Final   Staphylococcus species NOT DETECTED NOT DETECTED Final   Staphylococcus aureus (BCID) NOT DETECTED NOT DETECTED Final   Staphylococcus epidermidis NOT DETECTED NOT DETECTED Final   Staphylococcus lugdunensis NOT DETECTED NOT DETECTED Final   Streptococcus species NOT DETECTED NOT DETECTED Final   Streptococcus agalactiae NOT DETECTED NOT DETECTED Final   Streptococcus pneumoniae NOT DETECTED NOT DETECTED Final   Streptococcus pyogenes NOT DETECTED NOT DETECTED Final   A.calcoaceticus-baumannii NOT DETECTED NOT DETECTED Final   Bacteroides fragilis NOT DETECTED NOT DETECTED Final   Enterobacterales NOT DETECTED NOT DETECTED Final   Enterobacter cloacae complex NOT DETECTED NOT DETECTED Final   Escherichia coli NOT DETECTED NOT DETECTED Final   Klebsiella aerogenes NOT DETECTED NOT DETECTED Final   Klebsiella oxytoca NOT DETECTED NOT DETECTED Final   Klebsiella pneumoniae NOT DETECTED NOT DETECTED Final   Proteus species NOT DETECTED NOT DETECTED Final   Salmonella species NOT DETECTED NOT DETECTED Final   Serratia marcescens NOT DETECTED NOT DETECTED Final   Haemophilus influenzae NOT DETECTED NOT DETECTED Final   Neisseria meningitidis NOT DETECTED NOT DETECTED Final   Pseudomonas aeruginosa NOT DETECTED NOT DETECTED Final   Stenotrophomonas maltophilia NOT DETECTED NOT DETECTED Final   Candida albicans NOT DETECTED NOT DETECTED Final   Candida auris NOT DETECTED NOT DETECTED Final   Candida glabrata NOT DETECTED NOT DETECTED Final   Candida krusei NOT DETECTED NOT DETECTED Final   Candida parapsilosis NOT DETECTED NOT DETECTED Final   Candida tropicalis NOT DETECTED NOT DETECTED Final   Cryptococcus neoformans/gattii NOT DETECTED NOT DETECTED Final    Comment: Performed at [facility], [address], [place], [place] [address]  Resp panel by RT-PCR (RSV, Flu A&B, Covid)     Status: None   Collection Time: 06/05/24  3:36 PM   Specimen: Nasal Swab  Result Value Ref Range Status   SARS Coronavirus 2 by RT PCR NEGATIVE NEGATIVE Final   Influenza A by PCR NEGATIVE NEGATIVE Final  Influenza B by PCR NEGATIVE NEGATIVE Final    Comment: (NOTE) The Xpert Xpress SARS-CoV-2/FLU/RSV plus assay is intended as an aid in the diagnosis of influenza from Nasopharyngeal swab specimens and should not be used as a sole basis for treatment. Nasal washings and aspirates are unacceptable for Xpert Xpress SARS-CoV-2/FLU/RSV testing.  Fact Sheet for Patients: bloggercourse.com  Fact Sheet for Healthcare Providers: seriousbroker.it  This test is not yet approved or cleared by the [place]  FDA and has been authorized for detection and/or diagnosis of SARS-CoV-2 by FDA under an Emergency Use Authorization (EUA). This EUA will remain in effect (meaning this test can be used) for the duration of the COVID-19 declaration under Section 564(b)(1) of the Act, 21 U.S.C. section 360bbb-3(b)(1), unless the authorization is terminated or revoked.     Resp Syncytial Virus by PCR NEGATIVE NEGATIVE Final    Comment: (NOTE) Fact Sheet for Patients: bloggercourse.com  Fact Sheet for Healthcare Providers: seriousbroker.it  This test is not yet approved or cleared by the [place]  FDA and has been authorized for detection and/or diagnosis of SARS-CoV-2 by FDA under an Emergency Use Authorization (EUA). This EUA will remain in effect (meaning this test can be used) for the duration of the COVID-19 declaration under Section 564(b)(1) of the Act, 21 U.S.C. section 360bbb-3(b)(1), unless the authorization is terminated or revoked.  Performed at [facility], [address], [place],  [place] [address]      Radiology Studies: CT Angio Chest PE W and/or Wo Contrast Result Date: 06/05/2024 EXAM: CTA CHEST PE WITH CONTRAST CT ABDOMEN AND PELVIS WITH CONTRAST 06/05/2024 05:03:11 PM TECHNIQUE: CTA of the chest was performed after the administration of intravenous contrast. Multiplanar reformatted images are provided for review. MIP images are provided for review. CT of the abdomen and pelvis was performed with the administration of intravenous contrast. 75 mL of iohexol  (OMNIPAQUE ) 350 MG/ML injection was administered. Automated exposure control, iterative reconstruction, and/or weight based adjustment of the mA/kV was utilized to reduce the radiation dose to as low as reasonably achievable. COMPARISON: None available. CLINICAL HISTORY: Shortness of breath. Acute nonlocalized abdominal pain. FINDINGS: CHEST: PULMONARY ARTERIES: Pulmonary arteries are adequately opacified for evaluation. No intraluminal filling defect to suggest pulmonary embolism. Main pulmonary artery is normal in caliber. MEDIASTINUM: No mediastinal lymphadenopathy. Cardiac enlargement. Calcification of the thoracic aorta and coronary arteries. No thoracic aortic aneurysm. LUNGS AND PLEURA: Moderate bilateral pleural effusions. Atelectasis or pneumonia in the lung bases. No pneumothorax. SOFT TISSUES AND BONES: Degenerative changes in the thoracic spine. No acute soft tissue abnormality. ABDOMEN AND PELVIS: LIVER: No focal liver lesions. GALLBLADDER AND BILE DUCTS: Mild pericholecystic edema is likely related to ascites. No biliary ductal dilatation. SPLEEN: Spleen demonstrates no acute abnormality. PANCREAS: Circumscribed cystic lesion in the junction of the head and body of the pancreas measuring 2 cm in diameter. No pancreatic ductal dilatation or stranding. This may represent IPMN. ADRENAL GLANDS: Adrenal glands demonstrate no acute abnormality. KIDNEYS, URETERS AND BLADDER: Kidneys are atrophic. Nephrograms are homogeneous. No  solid masses. No hydronephrosis or hydroureter. No stones in the kidneys or ureters. No perinephric or periureteral stranding. The bladder is decompressed with a foley catheter in place. GI AND BOWEL: The stomach, small bowel, and colon are not abnormally distended. No wall thickening or inflammatory stranding is noted. There is no bowel obstruction. REPRODUCTIVE: The prostate gland is enlarged. PERITONEUM AND RETROPERITONEUM: Small amount of free fluid in the abdomen and pelvis. No free air. LYMPH NODES:  No significant lymphadenopathy. BONES AND SOFT TISSUES: Spondylolysis with moderate spondylolisthesis at L5-S1. Degenerative changes throughout the spine. Edema in the subcutaneous fat of the lower abdomen and pelvis. No acute abnormality of the visualized bones. No focal soft tissue abnormality. VASCULATURE: Of the abdominal aorta. No aneurysm. IMPRESSION: 1. No evidence of pulmonary embolism. 2. Moderate bilateral pleural effusions with atelectasis or pneumonia in the lung bases. No pneumothorax. 3. Cardiomegaly. 4. Small amount of free fluid in the abdomen and pelvis, likely ascites. 5. Circumscribed 2 cm cystic lesion at the junction of the head and body of the pancreas, possibly representing IPMN. Given age, no further surveillance is recommended in the absence of worrisome features. Electronically signed by: Elsie Gravely MD 06/05/2024 05:45 PM EST RP Workstation: [id number]   CT ABDOMEN PELVIS W CONTRAST Result Date: 06/05/2024 EXAM: CTA CHEST PE WITH CONTRAST CT ABDOMEN AND PELVIS WITH CONTRAST 06/05/2024 05:03:11 PM TECHNIQUE: CTA of the chest was performed after the administration of intravenous contrast. Multiplanar reformatted images are provided for review. MIP images are provided for review. CT of the abdomen and pelvis was performed with the administration of intravenous contrast. 75 mL of iohexol  (OMNIPAQUE ) 350 MG/ML injection was administered. Automated exposure control, iterative  reconstruction, and/or weight based adjustment of the mA/kV was utilized to reduce the radiation dose to as low as reasonably achievable. COMPARISON: None available. CLINICAL HISTORY: Shortness of breath. Acute nonlocalized abdominal pain. FINDINGS: CHEST: PULMONARY ARTERIES: Pulmonary arteries are adequately opacified for evaluation. No intraluminal filling defect to suggest pulmonary embolism. Main pulmonary artery is normal in caliber. MEDIASTINUM: No mediastinal lymphadenopathy. Cardiac enlargement. Calcification of the thoracic aorta and coronary arteries. No thoracic aortic aneurysm. LUNGS AND PLEURA: Moderate bilateral pleural effusions. Atelectasis or pneumonia in the lung bases. No pneumothorax. SOFT TISSUES AND BONES: Degenerative changes in the thoracic spine. No acute soft tissue abnormality. ABDOMEN AND PELVIS: LIVER: No focal liver lesions. GALLBLADDER AND BILE DUCTS: Mild pericholecystic edema is likely related to ascites. No biliary ductal dilatation. SPLEEN: Spleen demonstrates no acute abnormality. PANCREAS: Circumscribed cystic lesion in the junction of the head and body of the pancreas measuring 2 cm in diameter. No pancreatic ductal dilatation or stranding. This may represent IPMN. ADRENAL GLANDS: Adrenal glands demonstrate no acute abnormality. KIDNEYS, URETERS AND BLADDER: Kidneys are atrophic. Nephrograms are homogeneous. No solid masses. No hydronephrosis or hydroureter. No stones in the kidneys or ureters. No perinephric or periureteral stranding. The bladder is decompressed with a foley catheter in place. GI AND BOWEL: The stomach, small bowel, and colon are not abnormally distended. No wall thickening or inflammatory stranding is noted. There is no bowel obstruction. REPRODUCTIVE: The prostate gland is enlarged. PERITONEUM AND RETROPERITONEUM: Small amount of free fluid in the abdomen and pelvis. No free air. LYMPH NODES: No significant lymphadenopathy. BONES AND SOFT TISSUES:  Spondylolysis with moderate spondylolisthesis at L5-S1. Degenerative changes throughout the spine. Edema in the subcutaneous fat of the lower abdomen and pelvis. No acute abnormality of the visualized bones. No focal soft tissue abnormality. VASCULATURE: Of the abdominal aorta. No aneurysm. IMPRESSION: 1. No evidence of pulmonary embolism. 2. Moderate bilateral pleural effusions with atelectasis or pneumonia in the lung bases. No pneumothorax. 3. Cardiomegaly. 4. Small amount of free fluid in the abdomen and pelvis, likely ascites. 5. Circumscribed 2 cm cystic lesion at the junction of the head and body of the pancreas, possibly representing IPMN. Given age, no further surveillance is recommended in the absence of worrisome features. Electronically signed by: Elsie Gravely  MD 06/05/2024 05:45 PM EST RP Workstation: [id number]   DG Pelvis Portable Result Date: 06/05/2024 EXAM: 1 or 2 VIEW(S) XRAY OF THE PELVIS 06/05/2024 03:52:00 PM COMPARISON: CT pelvis 08/16/2019. CLINICAL HISTORY: Loss of ambulation, altered mental status. FINDINGS: BONES AND JOINTS: Cortical irregularity over the right greater trochanter possibly representing nondisplaced fracture. Rotation of the right hip limits evaluation. Recommend dedicated right hip views for further evaluation. Degenerative changes in the lower lumbar spine and bilateral hips. SOFT TISSUES: Unremarkable. LINES AND TUBES: Catheter overlying the pelvis. IMPRESSION: 1. Cortical irregularity over the right greater trochanter, possibly representing a nondisplaced fracture; rotation of the right hip limits evaluation. Dedicated right hip views are recommended for further evaluation. 2. Degenerative changes in the lower lumbar spine and bilateral hips. Electronically signed by: Elsie Gravely MD 06/05/2024 04:26 PM EST RP Workstation: [id number]   CT Head Wo Contrast EXAM: CT HEAD WITHOUT CONTRAST 06/05/2024 02:05:25 PM TECHNIQUE: CT of the head was performed without the  administration of intravenous contrast. Automated exposure control, iterative reconstruction, and/or weight based adjustment of the mA/kV was utilized to reduce the radiation dose to as low as reasonably achievable. COMPARISON: None available. CLINICAL HISTORY: Mental status change, unknown cause. FINDINGS: BRAIN AND VENTRICLES: There is no evidence of an acute cortically based infarct, intracranial hemorrhage, mass, midline shift, hydrocephalus, or extra-axial fluid collection. Lacunar infarcts are present in the basal ganglia and thalami bilaterally, some of which are chronic in appearance while others are of indeterminate age. Patchy hypodensities in the cerebral white matter bilaterally are nonspecific but compatible with moderate chronic small vessel ischemic disease. Cerebral atrophy is mild for age. Calcified atherosclerosis at the skull base. ORBITS: No acute abnormality. SINUSES: No acute abnormality. SOFT TISSUES AND SKULL: No acute soft tissue abnormality. No skull fracture. IMPRESSION: 1. No evidence of an acute cortical infarct or intracranial hemorrhage. 2. Moderate chronic small vessel ischemic disease with age-indeterminate lacunar infarcts in the deep ganglia. Electronically signed by: Dasie Hamburg MD 06/05/2024 03:45 PM EST RP Workstation: [id number]   DG Chest Portable 1 View Result Date: 06/05/2024 CLINICAL DATA:  Shortness of breath. Altered mental status. Weakness. EXAM: PORTABLE CHEST - 1 VIEW COMPARISON:  08/16/2019 FINDINGS: Heart is mildly enlarged. Mild pulmonary vascular congestion. Mediastinal silhouette otherwise within normal limits. Bibasilar opacities likely a combination of pulmonary edema, atelectasis, and small pleural effusions. Advanced degenerative changes of the LEFT glenohumeral joint. IMPRESSION: Radiographic findings most consistent with CHF. Electronically Signed   By: Aliene Lloyd M.D.   On: 06/05/2024 14:49     Scheduled Meds:  apixaban   5 mg Oral BID    atorvastatin   40 mg Oral Daily   Chlorhexidine  Gluconate Cloth  6 each Topical Daily   furosemide   40 mg Intravenous BID   metoprolol  succinate  50 mg Oral Daily   potassium chloride   40 mEq Oral BID   sodium chloride  flush  3 mL Intravenous Q12H   tamsulosin   0.4 mg Oral QPC supper   Continuous Infusions:   LOS: 3 days    Time spent:    Sigurd Pac, MD [facility]   06/08/2024, 12:02 PM    "

## 2024-06-08 NOTE — Progress Notes (Signed)
 Physical Therapy Treatment Patient Details Name: Michael Haas MRN: [id number] DOB: [birth year] Today's Date: 06/08/2024   History of Present Illness Pt is a 89 y.o. M presenting to [facility] on 06/05/24 with SOB and AMS. Pt admitted with acute encephalopathy and hypothermia. PMH is significant for CHF, HLD, HTN.    PT Comments  Pt tolerated session well, progressing from performing sit to stands with moderate physical assistance to no physical assistance, but continuing to require heavy cueing for sequencing as pt frequently tries to pull to stand via walker with walker tipping posteriorly. Pt then ambulated short room level distances with AD and no physical assistance but heavy cueing for sequencing and safety. PT will continue to treat pt while he is admitted. Patient will benefit from continued inpatient follow up therapy, <3 hours/day.     If plan is discharge home, recommend the following: A little help with walking and/or transfers;A little help with bathing/dressing/bathroom;Assistance with cooking/housework;Assist for transportation;Help with stairs or ramp for entrance   Can travel by private vehicle     No  Equipment Recommendations  None recommended by PT    Recommendations for Other Services       Precautions / Restrictions Precautions Precautions: Fall Recall of Precautions/Restrictions: Intact Restrictions Weight Bearing Restrictions Per Provider Order: No     Mobility  Bed Mobility Overal bed mobility: Needs Assistance Bed Mobility: Supine to Sit, Sit to Supine     Supine to sit: Contact guard, HOB elevated Sit to supine: Contact guard assist   General bed mobility comments: Pt completed supine to sit on R side of bed with HOB elevated, advancing BLEs towards EOB and forearm propping to then WB through hands, reciprocal scooting to bring hips forward. For sit to supine, HOB flat and no physical assistance. Pt assists LLE into bed with BUE. Increased time to complete.     Transfers Overall transfer level: Needs assistance Equipment used: Rolling walker (2 wheels) Transfers: Sit to/from Stand Sit to Stand: Contact guard assist           General transfer comment: Pt completed 5 STS from EOB, initially requiring moderate physical assistance for initial power-up, progressing to contact guard for safety. Pt requires frequent cueing for UE placement as he tends to pull up on walker despite therapist cues and walker tipping posteriorly. Increased time to complete.    Ambulation/Gait Ambulation/Gait assistance: Contact guard assist Gait Distance (Feet): 30 Feet (30' and then 15') Assistive device: Rolling walker (2 wheels) Gait Pattern/deviations: Step-through pattern, Decreased stance time - left, Decreased step length - right, Decreased stride length, Decreased dorsiflexion - left, Knee flexed in stance - left, Trunk flexed Gait velocity: reduced Gait velocity interpretation: <1.31 ft/sec, indicative of household ambulator   General Gait Details: Pt demonstrates slow reciprocal gait pattern with reduced L hip and knee flexion as well as L ankle DF during swing, dragging L foot to advance. Pt significantly reliant on RW for stability.   Stairs             Wheelchair Mobility     Tilt Bed    Modified Rankin (Stroke Patients Only)       Balance Overall balance assessment: Needs assistance Sitting-balance support: No upper extremity supported, Feet supported Sitting balance-Leahy Scale: Fair Sitting balance - Comments: seated EOB, able to perform reciprocal scoot to come to EOB   Standing balance support: Bilateral upper extremity supported, During functional activity, Reliant on assistive device for balance Standing balance-Leahy Scale: Poor Standing balance  comment: reliant on external support for optimal stability                            Communication Communication Communication: Impaired Factors Affecting  Communication: Hearing impaired  Cognition Arousal: Alert Behavior During Therapy: WFL for tasks assessed/performed   PT - Cognitive impairments: No apparent impairments                         Following commands: Intact      Cueing Cueing Techniques: Verbal cues, Gestural cues, Tactile cues  Exercises      General Comments General comments (skin integrity, edema, etc.): VSS on RA      Pertinent Vitals/Pain Pain Assessment Pain Assessment: No/denies pain    Home Living                          Prior Function            PT Goals (current goals can now be found in the care plan section) Acute Rehab PT Goals Patient Stated Goal: to go home PT Goal Formulation: With patient Time For Goal Achievement: 06/21/24 Potential to Achieve Goals: Good Progress towards PT goals: Progressing toward goals    Frequency    Min 2X/week      PT Plan      Co-evaluation              AM-PAC PT 6 Clicks Mobility   Outcome Measure  Help needed turning from your back to your side while in a flat bed without using bedrails?: A Little Help needed moving from lying on your back to sitting on the side of a flat bed without using bedrails?: A Little Help needed moving to and from a bed to a chair (including a wheelchair)?: A Little Help needed standing up from a chair using your arms (e.g., wheelchair or bedside chair)?: A Little Help needed to walk in hospital room?: A Little Help needed climbing 3-5 steps with a railing? : Total 6 Click Score: 16    End of Session Equipment Utilized During Treatment: Gait belt Activity Tolerance: Patient tolerated treatment well Patient left: in bed;with call bell/phone within reach;with bed alarm set Nurse Communication: Mobility status (bowel movement) PT Visit Diagnosis: Unsteadiness on feet (R26.81);Other abnormalities of gait and mobility (R26.89);Muscle weakness (generalized) (M62.81);Difficulty in walking, not  elsewhere classified (R26.2)     Time: 1122-1201 PT Time Calculation (min) (ACUTE ONLY): 39 min  Charges:    $Gait Training: 8-22 mins $Therapeutic Activity: 23-37 mins PT General Charges $$ ACUTE PT VISIT: 1 Visit                     Leontine Hilt DPT Acute Rehab Services [phone] Prefer contact via chat    Leontine NOVAK Gennell How 06/08/2024, 12:36 PM

## 2024-06-08 NOTE — Plan of Care (Signed)
  Problem: Education: Goal: Knowledge of General Education information will improve Description: Including pain rating scale, medication(s)/side effects and non-pharmacologic comfort measures Outcome: Progressing   Problem: Clinical Measurements: Goal: Will remain free from infection Outcome: Progressing Goal: Diagnostic test results will improve Outcome: Progressing Goal: Respiratory complications will improve Outcome: Progressing Goal: Cardiovascular complication will be avoided Outcome: Progressing   Problem: Activity: Goal: Risk for activity intolerance will decrease Outcome: Progressing   Problem: Elimination: Goal: Will not experience complications related to urinary retention Outcome: Progressing

## 2024-06-08 NOTE — Care Management Important Message (Signed)
 Important Message  Patient Details  Name: Michael Haas MRN: [id number] Date of Birth: 1928-07-17   Important Message Given:  Yes - Medicare IM     [name] Sharps 06/08/2024, 11:09 AM

## 2024-06-08 NOTE — TOC Progression Note (Addendum)
 Transition of Care [facility]) - Progression Note    Patient Details  Name: Michael Haas MRN: [id number] Date of Birth: 1929-01-09  [facility] of [facility] CM/SW Contact  Luise JAYSON Pan, [place] Phone Number: 06/08/2024, 10:38 AM  Clinical Narrative:   CSW followed up with Cathlean, pts spouse, about [facility]. Per Josette with [facility], CSW will need to follow up Monday as the facility has no bed availability at this time. CSW informed patient and patient is agreeable. CSW reviewed the other bed offers verbally with Mrs. Rehberg. She stated she would be open to patient going to a [facility] facility but that [facility] was a no and [facility] is too far.  Per Palliative, family would like outpatient palliative referral to [facility]. CSW to follow up once a facility is obtained.  CSW will continue to follow.    Expected Discharge Plan: Skilled Nursing Facility Barriers to Discharge: Continued Medical Work up, SNF Pending bed offer, English As A Second Language Teacher               Expected Discharge Plan and Services In-house Referral: Clinical Social Work   Post Acute Care Choice: Skilled Nursing Facility Living arrangements for the past 2 months: Single Family Home                                       Social Drivers of Health (SDOH) Interventions SDOH Screenings   Food Insecurity: No Food Insecurity (06/05/2024)  Housing: Low Risk (06/05/2024)  Transportation Needs: No Transportation Needs (06/05/2024)  Utilities: Not At Risk (06/05/2024)  Social Connections: Socially Isolated (06/05/2024)  Tobacco Use: Medium Risk (06/05/2024)    Readmission Risk Interventions    06/06/2024   12:39 PM  Readmission Risk Prevention Plan  Medication Screening Complete  Transportation Screening Complete

## 2024-06-09 DIAGNOSIS — G9340 Encephalopathy, unspecified: Secondary | ICD-10-CM | POA: Diagnosis not present

## 2024-06-09 LAB — BASIC METABOLIC PANEL WITH GFR
Anion gap: 12 (ref 5–15)
BUN: 36 mg/dL — ABNORMAL HIGH (ref 8–23)
CO2: 31 mmol/L (ref 22–32)
Calcium: 9.1 mg/dL (ref 8.9–10.3)
Chloride: 104 mmol/L (ref 98–111)
Creatinine, Ser: 1.38 mg/dL — ABNORMAL HIGH (ref 0.61–1.24)
GFR, Estimated: 47 mL/min — ABNORMAL LOW
Glucose, Bld: 90 mg/dL (ref 70–99)
Potassium: 4.3 mmol/L (ref 3.5–5.1)
Sodium: 147 mmol/L — ABNORMAL HIGH (ref 135–145)

## 2024-06-09 LAB — URINALYSIS, ROUTINE W REFLEX MICROSCOPIC
Bilirubin Urine: NEGATIVE
Glucose, UA: NEGATIVE mg/dL
Hgb urine dipstick: NEGATIVE
Ketones, ur: NEGATIVE mg/dL
Leukocytes,Ua: NEGATIVE
Nitrite: NEGATIVE
Protein, ur: NEGATIVE mg/dL
Specific Gravity, Urine: 1.01 (ref 1.005–1.030)
pH: 8 (ref 5.0–8.0)

## 2024-06-09 LAB — CBC
HCT: 45.7 % (ref 39.0–52.0)
Hemoglobin: 15.1 g/dL (ref 13.0–17.0)
MCH: 33 pg (ref 26.0–34.0)
MCHC: 33 g/dL (ref 30.0–36.0)
MCV: 100 fL (ref 80.0–100.0)
Platelets: 330 10*3/uL (ref 150–400)
RBC: 4.57 MIL/uL (ref 4.22–5.81)
RDW: 17 % — ABNORMAL HIGH (ref 11.5–15.5)
WBC: 17.3 10*3/uL — ABNORMAL HIGH (ref 4.0–10.5)
nRBC: 0 % (ref 0.0–0.2)

## 2024-06-09 LAB — PROCALCITONIN: Procalcitonin: 0.11 ng/mL

## 2024-06-09 MED ORDER — FUROSEMIDE 40 MG PO TABS
40.0000 mg | ORAL_TABLET | Freq: Every day | ORAL | Status: DC
  Administered 2024-06-09 – 2024-06-11 (×3): 40 mg via ORAL
  Filled 2024-06-09 (×3): qty 1

## 2024-06-09 NOTE — Progress Notes (Signed)
 " PROGRESS NOTE    Michael Haas  FMW:[id number] DOB: [birth year] DOA: 06/05/2024 PCP: Pcp, No  89/M with history of chronic combined CHF, paroxysmal A-fib on Eliquis , hypertension, chronic venous stasis ulcers, BPH, poor compliance with medications, had stopped taking all his heart medicines as well as using his CPAP and then saw cardiology few months ago and restarted some of his diuretics then. - Was brought to the ED by EMS 1/27 with confusion for 3 days, mild dyspnea, in the ED he was hypothermic, creatinine was 1.3, sodium 146, BNP 1474, troponin 51, WBC 17, flu COVID and RSV PCR were negative, he was placed under a Bair hugger and subsequently temperature improved and alertness improved as well CT head with moderate chronic small vessel disease CT chest and abdomen pelvis -moderate bilateral effusions, small ascites, 2 cm lesion near pancreatic head, likely IPMN - Foley catheter placed in the ED for urinary retention - 1/30, worsening leukocytosis, Foley removed  Subjective: Foley catheter removed yesterday, urinating okay now, denies dyspnea this morning, [birth year]  Assessment and Plan:  Hypothermia Acute metabolic encephalopathy Leukocytosis -Hypothermia and encephalopathy have resolved - TSH minimally elevated, free T4 is normal - No evidence of acute infectious process at this time, UA unremarkable, chest x-ray with effusions, CT abdomen pelvis as noted above - WBC worsening, procalcitonin is low, blood culture with GPC in clusters in 1 out of 4 bottles likely contaminant> Foley catheter removed yesterday -Check UA and repeat blood cultures, CBC in a.m. - Echo with preserved EF  Acute on chronic combined CHF RV failure Pulmonary hypertension - Last echo 4/21 noted EF of 45-50% - Repeat echo now with EF of 55-60%, moderately reduced RV, moderately elevated PASP - Volume status improving, switch to oral Lasix  today, continue Toprol  - Unna boots, DC amlodipine  - GDMT  limited, blood pressure soft - Overall poor prognosis with cognitive decline, advanced age, frailty, palliative consulted for goals of care> plan for SNF with palliative care and then transition to hospice when he goes home  Memory/cognitive issues -with functional decline  Permanent atrial fibrillation - Continue Toprol  and apixaban   BPH, urinary retention - Foley placed in the ED, continue Flomax  - Foley removed 1/30 - Will need urology follow-up  2 cm pancreatic head/neck lesion - Suspected to be IPMN - Follow-up with PCP, likely low yield to follow this at his age   DVT prophylaxis: Apixaban  Code Status: called and d/w spouse, DNR confirmed Family Communication: No family at bedside , called and updated spouse earlier Disposition Plan: SNF likely Monday  Consultants:    Procedures:   Antimicrobials:    Objective: Vitals:   06/08/24 2333 06/09/24 0310 06/09/24 0725 06/09/24 1115  BP: 115/74 117/83 117/70 (!) 94/48  Pulse: 83 76 64 (!) 42  Resp: 16 17 19 17   Temp: 97.8 F (36.6 C) (!) 97.5 F (36.4 C) 98.3 F (36.8 C) 98.2 F (36.8 C)  TempSrc: Oral Oral Oral Oral  SpO2: 93% 94% 92% (!) 89%  Weight:  68.8 kg    Height:        Intake/Output Summary (Last 24 hours) at 06/09/2024 1130 Last data filed at 06/09/2024 0951 Gross per 24 hour  Intake 500 ml  Output 3200 ml  Net -2700 ml   Filed Weights   06/05/24 1933 06/05/24 2149 06/09/24 0310  Weight: 71.7 kg 74.7 kg 68.8 kg    Examination:  General exam: Frail chronically ill, AAO x 2, mild cognitive deficits HEENT:  No JVD Respiratory system : Improved air movement, clear today Cardiovascular system: S1 & S2 heard, irregular Abd: nondistended, soft and nontender.Normal bowel sounds heard. Extremities: Unna boots on, trace edema Skin: No rashes Psychiatry: Flat affect Neuro: Mild cognitive deficits otherwise moves all extremities, no localizing signs    Data Reviewed:   CBC: Recent Labs  Lab  06/05/24 1325 06/05/24 1346 06/06/24 0318 06/07/24 0254 06/08/24 0248 06/09/24 0222  WBC 17.4*  --  13.5* 15.5* 18.3* 17.3*  HGB 14.5 15.3 12.7* 13.6 14.9 15.1  HCT 44.4 45.0 38.3* 41.5 44.3 45.7  MCV 103.3*  --  101.1* 101.0* 99.6 100.0  PLT 413*  --  359 339 324 330   Basic Metabolic Panel: Recent Labs  Lab 06/05/24 1325 06/05/24 1346 06/06/24 0318 06/07/24 0254 06/08/24 0248 06/09/24 0222  NA 146* 146* 147* 147* 147* 147*  K 4.6 4.2 4.0 3.4* 3.7 4.3  CL 108 108 109 105 102 104  CO2 26  --  30 33* 31 31  GLUCOSE 112* 110* 94 82 110* 90  BUN 34* 37* 35* 36* 36* 36*  CREATININE 1.32* 1.50* 1.56* 1.66* 1.58* 1.38*  CALCIUM  9.3  --  9.0 9.0 9.2 9.1  MG  --   --   --   --  2.3  --    GFR: Estimated Creatinine Clearance: 31.7 mL/min (A) (by C-G formula based on SCr of 1.38 mg/dL (H)). Liver Function Tests: Recent Labs  Lab 06/05/24 1325 06/06/24 0318 06/07/24 0254  AST 41 38 28  ALT 27 30 23   ALKPHOS 127* 118 117  BILITOT 0.6 0.6 0.8  PROT 6.7 5.8* 6.0*  ALBUMIN 3.8 3.5 3.5   No results for input(s): LIPASE, AMYLASE in the last 168 hours. No results for input(s): AMMONIA in the last 168 hours. Coagulation Profile: No results for input(s): INR, PROTIME in the last 168 hours. Cardiac Enzymes: No results for input(s): CKTOTAL, CKMB, CKMBINDEX, TROPONINI in the last 168 hours. BNP (last 3 results) Recent Labs    06/05/24 1325  PROBNP 1,474.0*   HbA1C: No results for input(s): HGBA1C in the last 72 hours. CBG: No results for input(s): GLUCAP in the last 168 hours. Lipid Profile: No results for input(s): CHOL, HDL, LDLCALC, TRIG, CHOLHDL, LDLDIRECT in the last 72 hours. Thyroid Function Tests: No results for input(s): TSH, T4TOTAL, FREET4, T3FREE, THYROIDAB in the last 72 hours.  Anemia Panel: No results for input(s): VITAMINB12, FOLATE, FERRITIN, TIBC, IRON, RETICCTPCT in the last 72 hours.  Urine  analysis:    Component Value Date/Time   COLORURINE YELLOW 06/05/2024 1558   APPEARANCEUR CLEAR 06/05/2024 1558   LABSPEC 1.020 06/05/2024 1558   PHURINE 5.0 06/05/2024 1558   GLUCOSEU NEGATIVE 06/05/2024 1558   HGBUR NEGATIVE 06/05/2024 1558   BILIRUBINUR NEGATIVE 06/05/2024 1558   KETONESUR NEGATIVE 06/05/2024 1558   PROTEINUR 100 (A) 06/05/2024 1558   NITRITE NEGATIVE 06/05/2024 1558   LEUKOCYTESUR NEGATIVE 06/05/2024 1558   Sepsis Labs: @LABRCNTIP (procalcitonin:4,lacticidven:4)  ) Recent Results (from the past 240 hours)  Urine Culture     Status: None   Collection Time: 06/05/24  1:29 PM   Specimen: Urine, Clean Catch  Result Value Ref Range Status   Specimen Description URINE, CLEAN CATCH  Final   Special Requests NONE  Final   Culture   Final    NO GROWTH Performed at [facility], [address] [place], [place] [address]    Report Status 06/07/2024 FINAL  Final  Blood  culture (routine x 2)     Status: None (Preliminary result)   Collection Time: 06/05/24  2:17 PM   Specimen: BLOOD  Result Value Ref Range Status   Specimen Description BLOOD LEFT ANTECUBITAL  Final   Special Requests   Final    BOTTLES DRAWN AEROBIC AND ANAEROBIC Blood Culture adequate volume   Culture   Final    NO GROWTH 4 DAYS Performed at [facility], [address], [place], [place] [address]    Report Status PENDING  Incomplete  Blood culture (routine x 2)     Status: Abnormal   Collection Time: 06/05/24  2:22 PM   Specimen: BLOOD  Result Value Ref Range Status   Specimen Description BLOOD RIGHT ANTECUBITAL  Final   Special Requests   Final    BOTTLES DRAWN AEROBIC AND ANAEROBIC Blood Culture adequate volume   Culture  Setup Time   Final    GRAM POSITIVE COCCI IN CLUSTERS ANAEROBIC BOTTLE ONLY PHARMD ASABRA BOONE [id number] @ 1946 FH    Culture (A)  Final    AEROCOCCUS SANGUINICOLA Standardized susceptibility testing for this organism is not available. Performed at [facility], [address], [place], [place] [address]    Report Status 06/08/2024 FINAL  Final  Blood Culture ID Panel (Reflexed)     Status: None   Collection Time: 06/05/24  2:22 PM  Result Value Ref Range Status   Enterococcus faecalis NOT DETECTED NOT DETECTED Final   Enterococcus Faecium NOT DETECTED NOT DETECTED Final   Listeria monocytogenes NOT DETECTED NOT DETECTED Final   Staphylococcus species NOT DETECTED NOT DETECTED Final   Staphylococcus aureus (BCID) NOT DETECTED NOT DETECTED Final   Staphylococcus epidermidis NOT DETECTED NOT DETECTED Final   Staphylococcus lugdunensis NOT DETECTED NOT DETECTED Final   Streptococcus species NOT DETECTED NOT DETECTED Final   Streptococcus agalactiae NOT DETECTED NOT DETECTED Final   Streptococcus pneumoniae NOT DETECTED NOT DETECTED Final   Streptococcus pyogenes NOT DETECTED NOT DETECTED Final   A.calcoaceticus-baumannii NOT DETECTED NOT DETECTED Final   Bacteroides fragilis NOT DETECTED NOT DETECTED Final   Enterobacterales NOT DETECTED NOT DETECTED Final   Enterobacter cloacae complex NOT DETECTED NOT DETECTED Final   Escherichia coli NOT DETECTED NOT DETECTED Final   Klebsiella aerogenes NOT DETECTED NOT DETECTED Final   Klebsiella oxytoca NOT DETECTED NOT DETECTED Final   Klebsiella pneumoniae NOT DETECTED NOT DETECTED Final   Proteus species NOT DETECTED NOT DETECTED Final   Salmonella species NOT DETECTED NOT DETECTED Final   Serratia marcescens NOT DETECTED NOT DETECTED Final   Haemophilus influenzae NOT DETECTED NOT DETECTED Final   Neisseria meningitidis NOT DETECTED NOT DETECTED Final   Pseudomonas aeruginosa NOT DETECTED NOT DETECTED Final   Stenotrophomonas maltophilia NOT DETECTED NOT DETECTED Final   Candida albicans NOT DETECTED NOT DETECTED Final   Candida auris NOT DETECTED NOT DETECTED Final   Candida glabrata NOT DETECTED NOT DETECTED Final   Candida krusei NOT DETECTED NOT DETECTED Final   Candida  parapsilosis NOT DETECTED NOT DETECTED Final   Candida tropicalis NOT DETECTED NOT DETECTED Final   Cryptococcus neoformans/gattii NOT DETECTED NOT DETECTED Final    Comment: Performed at [facility], [address], [place], [place] [address]  Resp panel by RT-PCR (RSV, Flu A&B, Covid)     Status: None   Collection Time: 06/05/24  3:36 PM   Specimen: Nasal Swab  Result Value Ref Range Status   SARS Coronavirus  2 by RT PCR NEGATIVE NEGATIVE Final   Influenza A by PCR NEGATIVE NEGATIVE Final   Influenza B by PCR NEGATIVE NEGATIVE Final    Comment: (NOTE) The Xpert Xpress SARS-CoV-2/FLU/RSV plus assay is intended as an aid in the diagnosis of influenza from Nasopharyngeal swab specimens and should not be used as a sole basis for treatment. Nasal washings and aspirates are unacceptable for Xpert Xpress SARS-CoV-2/FLU/RSV testing.  Fact Sheet for Patients: bloggercourse.com  Fact Sheet for Healthcare Providers: seriousbroker.it  This test is not yet approved or cleared by the [place]  FDA and has been authorized for detection and/or diagnosis of SARS-CoV-2 by FDA under an Emergency Use Authorization (EUA). This EUA will remain in effect (meaning this test can be used) for the duration of the COVID-19 declaration under Section 564(b)(1) of the Act, 21 U.S.C. section 360bbb-3(b)(1), unless the authorization is terminated or revoked.     Resp Syncytial Virus by PCR NEGATIVE NEGATIVE Final    Comment: (NOTE) Fact Sheet for Patients: bloggercourse.com  Fact Sheet for Healthcare Providers: seriousbroker.it  This test is not yet approved or cleared by the [place]  FDA and has been authorized for detection and/or diagnosis of SARS-CoV-2 by FDA under an Emergency Use Authorization (EUA). This EUA will remain in effect (meaning this test can be used) for the duration of  the COVID-19 declaration under Section 564(b)(1) of the Act, 21 U.S.C. section 360bbb-3(b)(1), unless the authorization is terminated or revoked.  Performed at [facility], [address], [place], [place] [address]      Radiology Studies: CT Angio Chest PE W and/or Wo Contrast Result Date: 06/05/2024 EXAM: CTA CHEST PE WITH CONTRAST CT ABDOMEN AND PELVIS WITH CONTRAST 06/05/2024 05:03:11 PM TECHNIQUE: CTA of the chest was performed after the administration of intravenous contrast. Multiplanar reformatted images are provided for review. MIP images are provided for review. CT of the abdomen and pelvis was performed with the administration of intravenous contrast. 75 mL of iohexol  (OMNIPAQUE ) 350 MG/ML injection was administered. Automated exposure control, iterative reconstruction, and/or weight based adjustment of the mA/kV was utilized to reduce the radiation dose to as low as reasonably achievable. COMPARISON: None available. CLINICAL HISTORY: Shortness of breath. Acute nonlocalized abdominal pain. FINDINGS: CHEST: PULMONARY ARTERIES: Pulmonary arteries are adequately opacified for evaluation. No intraluminal filling defect to suggest pulmonary embolism. Main pulmonary artery is normal in caliber. MEDIASTINUM: No mediastinal lymphadenopathy. Cardiac enlargement. Calcification of the thoracic aorta and coronary arteries. No thoracic aortic aneurysm. LUNGS AND PLEURA: Moderate bilateral pleural effusions. Atelectasis or pneumonia in the lung bases. No pneumothorax. SOFT TISSUES AND BONES: Degenerative changes in the thoracic spine. No acute soft tissue abnormality. ABDOMEN AND PELVIS: LIVER: No focal liver lesions. GALLBLADDER AND BILE DUCTS: Mild pericholecystic edema is likely related to ascites. No biliary ductal dilatation. SPLEEN: Spleen demonstrates no acute abnormality. PANCREAS: Circumscribed cystic lesion in the junction of the head and body of the pancreas measuring 2 cm in diameter. No  pancreatic ductal dilatation or stranding. This may represent IPMN. ADRENAL GLANDS: Adrenal glands demonstrate no acute abnormality. KIDNEYS, URETERS AND BLADDER: Kidneys are atrophic. Nephrograms are homogeneous. No solid masses. No hydronephrosis or hydroureter. No stones in the kidneys or ureters. No perinephric or periureteral stranding. The bladder is decompressed with a foley catheter in place. GI AND BOWEL: The stomach, small bowel, and colon are not abnormally distended. No wall thickening or inflammatory stranding is noted. There is no bowel obstruction. REPRODUCTIVE: The prostate gland is enlarged.  PERITONEUM AND RETROPERITONEUM: Small amount of free fluid in the abdomen and pelvis. No free air. LYMPH NODES: No significant lymphadenopathy. BONES AND SOFT TISSUES: Spondylolysis with moderate spondylolisthesis at L5-S1. Degenerative changes throughout the spine. Edema in the subcutaneous fat of the lower abdomen and pelvis. No acute abnormality of the visualized bones. No focal soft tissue abnormality. VASCULATURE: Of the abdominal aorta. No aneurysm. IMPRESSION: 1. No evidence of pulmonary embolism. 2. Moderate bilateral pleural effusions with atelectasis or pneumonia in the lung bases. No pneumothorax. 3. Cardiomegaly. 4. Small amount of free fluid in the abdomen and pelvis, likely ascites. 5. Circumscribed 2 cm cystic lesion at the junction of the head and body of the pancreas, possibly representing IPMN. Given age, no further surveillance is recommended in the absence of worrisome features. Electronically signed by: Elsie Gravely MD 06/05/2024 05:45 PM EST RP Workstation: [id number]   CT ABDOMEN PELVIS W CONTRAST Result Date: 06/05/2024 EXAM: CTA CHEST PE WITH CONTRAST CT ABDOMEN AND PELVIS WITH CONTRAST 06/05/2024 05:03:11 PM TECHNIQUE: CTA of the chest was performed after the administration of intravenous contrast. Multiplanar reformatted images are provided for review. MIP images are provided for  review. CT of the abdomen and pelvis was performed with the administration of intravenous contrast. 75 mL of iohexol  (OMNIPAQUE ) 350 MG/ML injection was administered. Automated exposure control, iterative reconstruction, and/or weight based adjustment of the mA/kV was utilized to reduce the radiation dose to as low as reasonably achievable. COMPARISON: None available. CLINICAL HISTORY: Shortness of breath. Acute nonlocalized abdominal pain. FINDINGS: CHEST: PULMONARY ARTERIES: Pulmonary arteries are adequately opacified for evaluation. No intraluminal filling defect to suggest pulmonary embolism. Main pulmonary artery is normal in caliber. MEDIASTINUM: No mediastinal lymphadenopathy. Cardiac enlargement. Calcification of the thoracic aorta and coronary arteries. No thoracic aortic aneurysm. LUNGS AND PLEURA: Moderate bilateral pleural effusions. Atelectasis or pneumonia in the lung bases. No pneumothorax. SOFT TISSUES AND BONES: Degenerative changes in the thoracic spine. No acute soft tissue abnormality. ABDOMEN AND PELVIS: LIVER: No focal liver lesions. GALLBLADDER AND BILE DUCTS: Mild pericholecystic edema is likely related to ascites. No biliary ductal dilatation. SPLEEN: Spleen demonstrates no acute abnormality. PANCREAS: Circumscribed cystic lesion in the junction of the head and body of the pancreas measuring 2 cm in diameter. No pancreatic ductal dilatation or stranding. This may represent IPMN. ADRENAL GLANDS: Adrenal glands demonstrate no acute abnormality. KIDNEYS, URETERS AND BLADDER: Kidneys are atrophic. Nephrograms are homogeneous. No solid masses. No hydronephrosis or hydroureter. No stones in the kidneys or ureters. No perinephric or periureteral stranding. The bladder is decompressed with a foley catheter in place. GI AND BOWEL: The stomach, small bowel, and colon are not abnormally distended. No wall thickening or inflammatory stranding is noted. There is no bowel obstruction. REPRODUCTIVE: The  prostate gland is enlarged. PERITONEUM AND RETROPERITONEUM: Small amount of free fluid in the abdomen and pelvis. No free air. LYMPH NODES: No significant lymphadenopathy. BONES AND SOFT TISSUES: Spondylolysis with moderate spondylolisthesis at L5-S1. Degenerative changes throughout the spine. Edema in the subcutaneous fat of the lower abdomen and pelvis. No acute abnormality of the visualized bones. No focal soft tissue abnormality. VASCULATURE: Of the abdominal aorta. No aneurysm. IMPRESSION: 1. No evidence of pulmonary embolism. 2. Moderate bilateral pleural effusions with atelectasis or pneumonia in the lung bases. No pneumothorax. 3. Cardiomegaly. 4. Small amount of free fluid in the abdomen and pelvis, likely ascites. 5. Circumscribed 2 cm cystic lesion at the junction of the head and body of the pancreas, possibly representing IPMN.  Given age, no further surveillance is recommended in the absence of worrisome features. Electronically signed by: Elsie Gravely MD 06/05/2024 05:45 PM EST RP Workstation: [id number]   DG Pelvis Portable Result Date: 06/05/2024 EXAM: 1 or 2 VIEW(S) XRAY OF THE PELVIS 06/05/2024 03:52:00 PM COMPARISON: CT pelvis 08/16/2019. CLINICAL HISTORY: Loss of ambulation, altered mental status. FINDINGS: BONES AND JOINTS: Cortical irregularity over the right greater trochanter possibly representing nondisplaced fracture. Rotation of the right hip limits evaluation. Recommend dedicated right hip views for further evaluation. Degenerative changes in the lower lumbar spine and bilateral hips. SOFT TISSUES: Unremarkable. LINES AND TUBES: Catheter overlying the pelvis. IMPRESSION: 1. Cortical irregularity over the right greater trochanter, possibly representing a nondisplaced fracture; rotation of the right hip limits evaluation. Dedicated right hip views are recommended for further evaluation. 2. Degenerative changes in the lower lumbar spine and bilateral hips. Electronically signed by:  Elsie Gravely MD 06/05/2024 04:26 PM EST RP Workstation: [id number]   CT Head Wo Contrast EXAM: CT HEAD WITHOUT CONTRAST 06/05/2024 02:05:25 PM TECHNIQUE: CT of the head was performed without the administration of intravenous contrast. Automated exposure control, iterative reconstruction, and/or weight based adjustment of the mA/kV was utilized to reduce the radiation dose to as low as reasonably achievable. COMPARISON: None available. CLINICAL HISTORY: Mental status change, unknown cause. FINDINGS: BRAIN AND VENTRICLES: There is no evidence of an acute cortically based infarct, intracranial hemorrhage, mass, midline shift, hydrocephalus, or extra-axial fluid collection. Lacunar infarcts are present in the basal ganglia and thalami bilaterally, some of which are chronic in appearance while others are of indeterminate age. Patchy hypodensities in the cerebral white matter bilaterally are nonspecific but compatible with moderate chronic small vessel ischemic disease. Cerebral atrophy is mild for age. Calcified atherosclerosis at the skull base. ORBITS: No acute abnormality. SINUSES: No acute abnormality. SOFT TISSUES AND SKULL: No acute soft tissue abnormality. No skull fracture. IMPRESSION: 1. No evidence of an acute cortical infarct or intracranial hemorrhage. 2. Moderate chronic small vessel ischemic disease with age-indeterminate lacunar infarcts in the deep ganglia. Electronically signed by: Dasie Hamburg MD 06/05/2024 03:45 PM EST RP Workstation: [id number]   DG Chest Portable 1 View Result Date: 06/05/2024 CLINICAL DATA:  Shortness of breath. Altered mental status. Weakness. EXAM: PORTABLE CHEST - 1 VIEW COMPARISON:  08/16/2019 FINDINGS: Heart is mildly enlarged. Mild pulmonary vascular congestion. Mediastinal silhouette otherwise within normal limits. Bibasilar opacities likely a combination of pulmonary edema, atelectasis, and small pleural effusions. Advanced degenerative changes of the LEFT  glenohumeral joint. IMPRESSION: Radiographic findings most consistent with CHF. Electronically Signed   By: Aliene Lloyd M.D.   On: 06/05/2024 14:49     Scheduled Meds:  apixaban   5 mg Oral BID   atorvastatin   40 mg Oral Daily   Chlorhexidine  Gluconate Cloth  6 each Topical Daily   furosemide   40 mg Intravenous BID   metoprolol  succinate  50 mg Oral Daily   sodium chloride  flush  3 mL Intravenous Q12H   tamsulosin   0.4 mg Oral QPC supper   Continuous Infusions:   LOS: 4 days    Time spent:    Sigurd Pac, MD [facility]   06/09/2024, 11:30 AM    "

## 2024-06-09 NOTE — Plan of Care (Signed)
  Problem: Education: Goal: Knowledge of General Education information will improve Description: Including pain rating scale, medication(s)/side effects and non-pharmacologic comfort measures Outcome: Progressing   Problem: Nutrition: Goal: Adequate nutrition will be maintained Outcome: Progressing   Problem: Pain Managment: Goal: General experience of comfort will improve and/or be controlled Outcome: Progressing

## 2024-06-09 NOTE — Plan of Care (Signed)
   Problem: Education: Goal: Knowledge of General Education information will improve Description: Including pain rating scale, medication(s)/side effects and non-pharmacologic comfort measures Outcome: Progressing   Problem: Activity: Goal: Risk for activity intolerance will decrease Outcome: Progressing   Problem: Nutrition: Goal: Adequate nutrition will be maintained Outcome: Progressing   Problem: Coping: Goal: Level of anxiety will decrease Outcome: Progressing

## 2024-06-10 DIAGNOSIS — G9340 Encephalopathy, unspecified: Secondary | ICD-10-CM | POA: Diagnosis not present

## 2024-06-10 LAB — BASIC METABOLIC PANEL WITH GFR
Anion gap: 11 (ref 5–15)
BUN: 36 mg/dL — ABNORMAL HIGH (ref 8–23)
CO2: 31 mmol/L (ref 22–32)
Calcium: 8.7 mg/dL — ABNORMAL LOW (ref 8.9–10.3)
Chloride: 104 mmol/L (ref 98–111)
Creatinine, Ser: 1.38 mg/dL — ABNORMAL HIGH (ref 0.61–1.24)
GFR, Estimated: 47 mL/min — ABNORMAL LOW
Glucose, Bld: 103 mg/dL — ABNORMAL HIGH (ref 70–99)
Potassium: 4 mmol/L (ref 3.5–5.1)
Sodium: 145 mmol/L (ref 135–145)

## 2024-06-10 LAB — CBC
HCT: 45.2 % (ref 39.0–52.0)
Hemoglobin: 14.6 g/dL (ref 13.0–17.0)
MCH: 32.6 pg (ref 26.0–34.0)
MCHC: 32.3 g/dL (ref 30.0–36.0)
MCV: 100.9 fL — ABNORMAL HIGH (ref 80.0–100.0)
Platelets: 316 10*3/uL (ref 150–400)
RBC: 4.48 MIL/uL (ref 4.22–5.81)
RDW: 16.7 % — ABNORMAL HIGH (ref 11.5–15.5)
WBC: 16.2 10*3/uL — ABNORMAL HIGH (ref 4.0–10.5)
nRBC: 0 % (ref 0.0–0.2)

## 2024-06-10 LAB — CULTURE, BLOOD (ROUTINE X 2)
Culture: NO GROWTH
Special Requests: ADEQUATE

## 2024-06-10 MED ORDER — SPIRONOLACTONE 25 MG PO TABS
25.0000 mg | ORAL_TABLET | Freq: Every day | ORAL | Status: DC
  Administered 2024-06-10 – 2024-06-11 (×2): 25 mg via ORAL
  Filled 2024-06-10 (×2): qty 1

## 2024-06-10 NOTE — Plan of Care (Signed)
   Problem: Nutrition: Goal: Adequate nutrition will be maintained Outcome: Progressing   Problem: Pain Managment: Goal: General experience of comfort will improve and/or be controlled Outcome: Progressing   Problem: Safety: Goal: Ability to remain free from injury will improve Outcome: Progressing

## 2024-06-10 NOTE — Progress Notes (Signed)
 " PROGRESS NOTE    Michael Haas  FMW:[id number] DOB: 10-May-1929 DOA: 06/05/2024 PCP: Pcp, No  89/M with history of chronic combined CHF, paroxysmal A-fib on Eliquis , hypertension, chronic venous stasis ulcers, BPH, poor compliance with medications, had stopped taking all his heart medicines as well as using his CPAP and then saw cardiology few months ago and restarted some of his diuretics then. - Was brought to the ED by EMS 1/27 with confusion for 3 days, mild dyspnea, in the ED he was hypothermic, creatinine was 1.3, sodium 146, BNP 1474, troponin 51, WBC 17, flu COVID and RSV PCR were negative, he was placed under a Bair hugger and subsequently temperature improved and alertness improved as well CT head with moderate chronic small vessel disease CT chest and abdomen pelvis -moderate bilateral effusions, small ascites, 2 cm lesion near pancreatic head, likely IPMN - Foley catheter placed in the ED for urinary retention - 1/30, worsening leukocytosis, Foley removed  Subjective: Foley catheter removed yesterday, urinating okay now, denies dyspnea this morning, [birth year] [age]  Assessment and Plan:  Hypothermia Acute metabolic encephalopathy Leukocytosis -Hypothermia and encephalopathy have resolved - TSH minimally elevated, free T4 is normal - No evidence of acute infectious process at this time, UA unremarkable, chest x-ray with effusions, CT abdomen pelvis as noted above - WBC persists, procalcitonin is low, blood culture with GPC in clusters in 1 out of 4 bottles likely contaminant> Foley catheter removed 1/30 - Repeat UA and cultures unremarkable thus far - Echo with preserved EF - Afebrile and nontoxic, CBC in a.m.  Acute on chronic combined CHF RV failure Pulmonary hypertension - Last echo 4/21 noted EF of 45-50% - Repeat echo now with EF of 55-60%, moderately reduced RV, moderately elevated PASP - Volume status improving, switch to oral Lasix  today, continue Toprol  - Unna  boots, DC amlodipine  - GDMT limited, blood pressure soft - Overall poor prognosis with cognitive decline, advanced age, frailty, palliative consulted for goals of care> plan for SNF with palliative care and then transition to hospice when he goes home  Memory/cognitive issues -with functional decline  Permanent atrial fibrillation - Continue Toprol  and apixaban   BPH, urinary retention - Foley placed in the ED, continue Flomax  - Foley removed 1/30 - Will need urology follow-up  2 cm pancreatic head/neck lesion - Suspected to be IPMN - Follow-up with PCP, likely low yield to follow this at his age   DVT prophylaxis: Apixaban  Code Status: called and d/w spouse, DNR confirmed Family Communication: No family at bedside , called and updated spouse earlier Disposition Plan: SNF likely Monday  Consultants:    Procedures:   Antimicrobials:    Objective: Vitals:   06/09/24 1934 06/10/24 0010 06/10/24 0556 06/10/24 0830  BP: 99/66 116/72 122/84 (!) 114/98  Pulse: 88 90 75 84  Resp: 16 19 15 16   Temp: (!) 97.4 F (36.3 C) (!) 97.3 F (36.3 C) 97.9 F (36.6 C) (!) 97.3 F (36.3 C)  TempSrc: Oral Oral Oral Oral  SpO2: 94% 90%  96%  Weight:      Height:        Intake/Output Summary (Last 24 hours) at 06/10/2024 1145 Last data filed at 06/10/2024 1000 Gross per 24 hour  Intake 290 ml  Output 950 ml  Net -660 ml   Filed Weights   06/05/24 1933 06/05/24 2149 06/09/24 0310  Weight: 71.7 kg 74.7 kg 68.8 kg    Examination:  General exam: Frail chronically ill, AAO x  2, pleasant, mild cognitive deficits, no distress HEENT: No JVD Respiratory system : Few scattered rhonchi otherwise clear Cardiovascular system: S1 & S2 heard, irregular Abd: nondistended, soft and nontender.Normal bowel sounds heard. Extremities: UNNA Boots, trace edema Skin: No rashes Psychiatry: Flat affect Neuro: Mild cognitive deficits otherwise moves all extremities, no localizing signs    Data  Reviewed:   CBC: Recent Labs  Lab 06/06/24 0318 06/07/24 0254 06/08/24 0248 06/09/24 0222 06/10/24 0138  WBC 13.5* 15.5* 18.3* 17.3* 16.2*  HGB 12.7* 13.6 14.9 15.1 14.6  HCT 38.3* 41.5 44.3 45.7 45.2  MCV 101.1* 101.0* 99.6 100.0 100.9*  PLT 359 339 324 330 316   Basic Metabolic Panel: Recent Labs  Lab 06/06/24 0318 06/07/24 0254 06/08/24 0248 06/09/24 0222 06/10/24 0138  NA 147* 147* 147* 147* 145  K 4.0 3.4* 3.7 4.3 4.0  CL 109 105 102 104 104  CO2 30 33* 31 31 31   GLUCOSE 94 82 110* 90 103*  BUN 35* 36* 36* 36* 36*  CREATININE 1.56* 1.66* 1.58* 1.38* 1.38*  CALCIUM  9.0 9.0 9.2 9.1 8.7*  MG  --   --  2.3  --   --    GFR: Estimated Creatinine Clearance: 31.7 mL/min (A) (by C-G formula based on SCr of 1.38 mg/dL (H)). Liver Function Tests: Recent Labs  Lab 06/05/24 1325 06/06/24 0318 06/07/24 0254  AST 41 38 28  ALT 27 30 23   ALKPHOS 127* 118 117  BILITOT 0.6 0.6 0.8  PROT 6.7 5.8* 6.0*  ALBUMIN 3.8 3.5 3.5   No results for input(s): LIPASE, AMYLASE in the last 168 hours. No results for input(s): AMMONIA in the last 168 hours. Coagulation Profile: No results for input(s): INR, PROTIME in the last 168 hours. Cardiac Enzymes: No results for input(s): CKTOTAL, CKMB, CKMBINDEX, TROPONINI in the last 168 hours. BNP (last 3 results) Recent Labs    06/05/24 1325  PROBNP 1,474.0*   HbA1C: No results for input(s): HGBA1C in the last 72 hours. CBG: No results for input(s): GLUCAP in the last 168 hours. Lipid Profile: No results for input(s): CHOL, HDL, LDLCALC, TRIG, CHOLHDL, LDLDIRECT in the last 72 hours. Thyroid Function Tests: No results for input(s): TSH, T4TOTAL, FREET4, T3FREE, THYROIDAB in the last 72 hours.  Anemia Panel: No results for input(s): VITAMINB12, FOLATE, FERRITIN, TIBC, IRON, RETICCTPCT in the last 72 hours.  Urine analysis:    Component Value Date/Time   COLORURINE YELLOW  06/09/2024 0949   APPEARANCEUR CLEAR 06/09/2024 0949   LABSPEC 1.010 06/09/2024 0949   PHURINE 8.0 06/09/2024 0949   GLUCOSEU NEGATIVE 06/09/2024 0949   HGBUR NEGATIVE 06/09/2024 0949   BILIRUBINUR NEGATIVE 06/09/2024 0949   KETONESUR NEGATIVE 06/09/2024 0949   PROTEINUR NEGATIVE 06/09/2024 0949   NITRITE NEGATIVE 06/09/2024 0949   LEUKOCYTESUR NEGATIVE 06/09/2024 0949   Sepsis Labs: @LABRCNTIP (procalcitonin:4,lacticidven:4)  ) Recent Results (from the past 240 hours)  Urine Culture     Status: None   Collection Time: 06/05/24  1:29 PM   Specimen: Urine, Clean Catch  Result Value Ref Range Status   Specimen Description URINE, CLEAN CATCH  Final   Special Requests NONE  Final   Culture   Final    NO GROWTH Performed at [facility], [address], [place], [place] [address]    Report Status 06/07/2024 FINAL  Final  Blood culture (routine x 2)     Status: None   Collection Time: 06/05/24  2:17 PM   Specimen: BLOOD  Result  Value Ref Range Status   Specimen Description BLOOD LEFT ANTECUBITAL  Final   Special Requests   Final    BOTTLES DRAWN AEROBIC AND ANAEROBIC Blood Culture adequate volume   Culture   Final    NO GROWTH 5 DAYS Performed at [facility], [address], [place], [place] [address]    Report Status 06/10/2024 FINAL  Final  Blood culture (routine x 2)     Status: Abnormal   Collection Time: 06/05/24  2:22 PM   Specimen: BLOOD  Result Value Ref Range Status   Specimen Description BLOOD RIGHT ANTECUBITAL  Final   Special Requests   Final    BOTTLES DRAWN AEROBIC AND ANAEROBIC Blood Culture adequate volume   Culture  Setup Time   Final    GRAM POSITIVE COCCI IN CLUSTERS ANAEROBIC BOTTLE ONLY PHARMD ASABRA BOONE [id number] @ 1946 FH    Culture (A)  Final    AEROCOCCUS SANGUINICOLA Standardized susceptibility testing for this organism is not available. Performed at [facility], [address], [place], [place] [address]    Report Status  06/08/2024 FINAL  Final  Blood Culture ID Panel (Reflexed)     Status: None   Collection Time: 06/05/24  2:22 PM  Result Value Ref Range Status   Enterococcus faecalis NOT DETECTED NOT DETECTED Final   Enterococcus Faecium NOT DETECTED NOT DETECTED Final   Listeria monocytogenes NOT DETECTED NOT DETECTED Final   Staphylococcus species NOT DETECTED NOT DETECTED Final   Staphylococcus aureus (BCID) NOT DETECTED NOT DETECTED Final   Staphylococcus epidermidis NOT DETECTED NOT DETECTED Final   Staphylococcus lugdunensis NOT DETECTED NOT DETECTED Final   Streptococcus species NOT DETECTED NOT DETECTED Final   Streptococcus agalactiae NOT DETECTED NOT DETECTED Final   Streptococcus pneumoniae NOT DETECTED NOT DETECTED Final   Streptococcus pyogenes NOT DETECTED NOT DETECTED Final   A.calcoaceticus-baumannii NOT DETECTED NOT DETECTED Final   Bacteroides fragilis NOT DETECTED NOT DETECTED Final   Enterobacterales NOT DETECTED NOT DETECTED Final   Enterobacter cloacae complex NOT DETECTED NOT DETECTED Final   Escherichia coli NOT DETECTED NOT DETECTED Final   Klebsiella aerogenes NOT DETECTED NOT DETECTED Final   Klebsiella oxytoca NOT DETECTED NOT DETECTED Final   Klebsiella pneumoniae NOT DETECTED NOT DETECTED Final   Proteus species NOT DETECTED NOT DETECTED Final   Salmonella species NOT DETECTED NOT DETECTED Final   Serratia marcescens NOT DETECTED NOT DETECTED Final   Haemophilus influenzae NOT DETECTED NOT DETECTED Final   Neisseria meningitidis NOT DETECTED NOT DETECTED Final   Pseudomonas aeruginosa NOT DETECTED NOT DETECTED Final   Stenotrophomonas maltophilia NOT DETECTED NOT DETECTED Final   Candida albicans NOT DETECTED NOT DETECTED Final   Candida auris NOT DETECTED NOT DETECTED Final   Candida glabrata NOT DETECTED NOT DETECTED Final   Candida krusei NOT DETECTED NOT DETECTED Final   Candida parapsilosis NOT DETECTED NOT DETECTED Final   Candida tropicalis NOT DETECTED NOT  DETECTED Final   Cryptococcus neoformans/gattii NOT DETECTED NOT DETECTED Final    Comment: Performed at [facility], [address], [place], [place] [address]  Resp panel by RT-PCR (RSV, Flu A&B, Covid)     Status: None   Collection Time: 06/05/24  3:36 PM   Specimen: Nasal Swab  Result Value Ref Range Status   SARS Coronavirus 2 by RT PCR NEGATIVE NEGATIVE Final   Influenza A by PCR NEGATIVE NEGATIVE Final   Influenza B by PCR NEGATIVE NEGATIVE Final  Comment: (NOTE) The Xpert Xpress SARS-CoV-2/FLU/RSV plus assay is intended as an aid in the diagnosis of influenza from Nasopharyngeal swab specimens and should not be used as a sole basis for treatment. Nasal washings and aspirates are unacceptable for Xpert Xpress SARS-CoV-2/FLU/RSV testing.  Fact Sheet for Patients: bloggercourse.com  Fact Sheet for Healthcare Providers: seriousbroker.it  This test is not yet approved or cleared by the [place]  FDA and has been authorized for detection and/or diagnosis of SARS-CoV-2 by FDA under an Emergency Use Authorization (EUA). This EUA will remain in effect (meaning this test can be used) for the duration of the COVID-19 declaration under Section 564(b)(1) of the Act, 21 U.S.C. section 360bbb-3(b)(1), unless the authorization is terminated or revoked.     Resp Syncytial Virus by PCR NEGATIVE NEGATIVE Final    Comment: (NOTE) Fact Sheet for Patients: bloggercourse.com  Fact Sheet for Healthcare Providers: seriousbroker.it  This test is not yet approved or cleared by the [place]  FDA and has been authorized for detection and/or diagnosis of SARS-CoV-2 by FDA under an Emergency Use Authorization (EUA). This EUA will remain in effect (meaning this test can be used) for the duration of the COVID-19 declaration under Section 564(b)(1) of the Act, 21 U.S.C. section  360bbb-3(b)(1), unless the authorization is terminated or revoked.  Performed at [facility], [address] [place], [place] [address]   Culture, blood (Routine X 2) w Reflex to ID Panel     Status: None (Preliminary result)   Collection Time: 06/09/24 12:23 PM   Specimen: BLOOD RIGHT HAND  Result Value Ref Range Status   Specimen Description BLOOD RIGHT HAND  Final   Special Requests   Final    BOTTLES DRAWN AEROBIC AND ANAEROBIC Blood Culture results may not be optimal due to an inadequate volume of blood received in culture bottles   Culture   Final    NO GROWTH < 24 HOURS Performed at [facility], [address] [place], [place] [address]    Report Status PENDING  Incomplete  Culture, blood (Routine X 2) w Reflex to ID Panel     Status: None (Preliminary result)   Collection Time: 06/09/24 12:28 PM   Specimen: BLOOD LEFT HAND  Result Value Ref Range Status   Specimen Description BLOOD LEFT HAND  Final   Special Requests   Final    BOTTLES DRAWN AEROBIC AND ANAEROBIC Blood Culture results may not be optimal due to an inadequate volume of blood received in culture bottles   Culture   Final    NO GROWTH < 24 HOURS Performed at [facility], [address] [place], [place] [address]    Report Status PENDING  Incomplete     Radiology Studies: CT Angio Chest PE W and/or Wo Contrast Result Date: 06/05/2024 EXAM: CTA CHEST PE WITH CONTRAST CT ABDOMEN AND PELVIS WITH CONTRAST 06/05/2024 05:03:11 PM TECHNIQUE: CTA of the chest was performed after the administration of intravenous contrast. Multiplanar reformatted images are provided for review. MIP images are provided for review. CT of the abdomen and pelvis was performed with the administration of intravenous contrast. 75 mL of iohexol  (OMNIPAQUE ) 350 MG/ML injection was administered. Automated exposure control, iterative reconstruction, and/or weight based adjustment of the mA/kV was utilized to reduce the  radiation dose to as low as reasonably achievable. COMPARISON: None available. CLINICAL HISTORY: Shortness of breath. Acute nonlocalized abdominal pain. FINDINGS: CHEST: PULMONARY ARTERIES: Pulmonary arteries are adequately opacified for evaluation. No  intraluminal filling defect to suggest pulmonary embolism. Main pulmonary artery is normal in caliber. MEDIASTINUM: No mediastinal lymphadenopathy. Cardiac enlargement. Calcification of the thoracic aorta and coronary arteries. No thoracic aortic aneurysm. LUNGS AND PLEURA: Moderate bilateral pleural effusions. Atelectasis or pneumonia in the lung bases. No pneumothorax. SOFT TISSUES AND BONES: Degenerative changes in the thoracic spine. No acute soft tissue abnormality. ABDOMEN AND PELVIS: LIVER: No focal liver lesions. GALLBLADDER AND BILE DUCTS: Mild pericholecystic edema is likely related to ascites. No biliary ductal dilatation. SPLEEN: Spleen demonstrates no acute abnormality. PANCREAS: Circumscribed cystic lesion in the junction of the head and body of the pancreas measuring 2 cm in diameter. No pancreatic ductal dilatation or stranding. This may represent IPMN. ADRENAL GLANDS: Adrenal glands demonstrate no acute abnormality. KIDNEYS, URETERS AND BLADDER: Kidneys are atrophic. Nephrograms are homogeneous. No solid masses. No hydronephrosis or hydroureter. No stones in the kidneys or ureters. No perinephric or periureteral stranding. The bladder is decompressed with a foley catheter in place. GI AND BOWEL: The stomach, small bowel, and colon are not abnormally distended. No wall thickening or inflammatory stranding is noted. There is no bowel obstruction. REPRODUCTIVE: The prostate gland is enlarged. PERITONEUM AND RETROPERITONEUM: Small amount of free fluid in the abdomen and pelvis. No free air. LYMPH NODES: No significant lymphadenopathy. BONES AND SOFT TISSUES: Spondylolysis with moderate spondylolisthesis at L5-S1. Degenerative changes throughout the spine.  Edema in the subcutaneous fat of the lower abdomen and pelvis. No acute abnormality of the visualized bones. No focal soft tissue abnormality. VASCULATURE: Of the abdominal aorta. No aneurysm. IMPRESSION: 1. No evidence of pulmonary embolism. 2. Moderate bilateral pleural effusions with atelectasis or pneumonia in the lung bases. No pneumothorax. 3. Cardiomegaly. 4. Small amount of free fluid in the abdomen and pelvis, likely ascites. 5. Circumscribed 2 cm cystic lesion at the junction of the head and body of the pancreas, possibly representing IPMN. Given age, no further surveillance is recommended in the absence of worrisome features. Electronically signed by: Elsie Gravely MD 06/05/2024 05:45 PM EST RP Workstation: [id number]   CT ABDOMEN PELVIS W CONTRAST Result Date: 06/05/2024 EXAM: CTA CHEST PE WITH CONTRAST CT ABDOMEN AND PELVIS WITH CONTRAST 06/05/2024 05:03:11 PM TECHNIQUE: CTA of the chest was performed after the administration of intravenous contrast. Multiplanar reformatted images are provided for review. MIP images are provided for review. CT of the abdomen and pelvis was performed with the administration of intravenous contrast. 75 mL of iohexol  (OMNIPAQUE ) 350 MG/ML injection was administered. Automated exposure control, iterative reconstruction, and/or weight based adjustment of the mA/kV was utilized to reduce the radiation dose to as low as reasonably achievable. COMPARISON: None available. CLINICAL HISTORY: Shortness of breath. Acute nonlocalized abdominal pain. FINDINGS: CHEST: PULMONARY ARTERIES: Pulmonary arteries are adequately opacified for evaluation. No intraluminal filling defect to suggest pulmonary embolism. Main pulmonary artery is normal in caliber. MEDIASTINUM: No mediastinal lymphadenopathy. Cardiac enlargement. Calcification of the thoracic aorta and coronary arteries. No thoracic aortic aneurysm. LUNGS AND PLEURA: Moderate bilateral pleural effusions. Atelectasis or pneumonia  in the lung bases. No pneumothorax. SOFT TISSUES AND BONES: Degenerative changes in the thoracic spine. No acute soft tissue abnormality. ABDOMEN AND PELVIS: LIVER: No focal liver lesions. GALLBLADDER AND BILE DUCTS: Mild pericholecystic edema is likely related to ascites. No biliary ductal dilatation. SPLEEN: Spleen demonstrates no acute abnormality. PANCREAS: Circumscribed cystic lesion in the junction of the head and body of the pancreas measuring 2 cm in diameter. No pancreatic ductal dilatation or stranding. This may represent IPMN.  ADRENAL GLANDS: Adrenal glands demonstrate no acute abnormality. KIDNEYS, URETERS AND BLADDER: Kidneys are atrophic. Nephrograms are homogeneous. No solid masses. No hydronephrosis or hydroureter. No stones in the kidneys or ureters. No perinephric or periureteral stranding. The bladder is decompressed with a foley catheter in place. GI AND BOWEL: The stomach, small bowel, and colon are not abnormally distended. No wall thickening or inflammatory stranding is noted. There is no bowel obstruction. REPRODUCTIVE: The prostate gland is enlarged. PERITONEUM AND RETROPERITONEUM: Small amount of free fluid in the abdomen and pelvis. No free air. LYMPH NODES: No significant lymphadenopathy. BONES AND SOFT TISSUES: Spondylolysis with moderate spondylolisthesis at L5-S1. Degenerative changes throughout the spine. Edema in the subcutaneous fat of the lower abdomen and pelvis. No acute abnormality of the visualized bones. No focal soft tissue abnormality. VASCULATURE: Of the abdominal aorta. No aneurysm. IMPRESSION: 1. No evidence of pulmonary embolism. 2. Moderate bilateral pleural effusions with atelectasis or pneumonia in the lung bases. No pneumothorax. 3. Cardiomegaly. 4. Small amount of free fluid in the abdomen and pelvis, likely ascites. 5. Circumscribed 2 cm cystic lesion at the junction of the head and body of the pancreas, possibly representing IPMN. Given age, no further  surveillance is recommended in the absence of worrisome features. Electronically signed by: Elsie Gravely MD 06/05/2024 05:45 PM EST RP Workstation: [id number]   DG Pelvis Portable Result Date: 06/05/2024 EXAM: 1 or 2 VIEW(S) XRAY OF THE PELVIS 06/05/2024 03:52:00 PM COMPARISON: CT pelvis 08/16/2019. CLINICAL HISTORY: Loss of ambulation, altered mental status. FINDINGS: BONES AND JOINTS: Cortical irregularity over the right greater trochanter possibly representing nondisplaced fracture. Rotation of the right hip limits evaluation. Recommend dedicated right hip views for further evaluation. Degenerative changes in the lower lumbar spine and bilateral hips. SOFT TISSUES: Unremarkable. LINES AND TUBES: Catheter overlying the pelvis. IMPRESSION: 1. Cortical irregularity over the right greater trochanter, possibly representing a nondisplaced fracture; rotation of the right hip limits evaluation. Dedicated right hip views are recommended for further evaluation. 2. Degenerative changes in the lower lumbar spine and bilateral hips. Electronically signed by: Elsie Gravely MD 06/05/2024 04:26 PM EST RP Workstation: [id number]   CT Head Wo Contrast EXAM: CT HEAD WITHOUT CONTRAST 06/05/2024 02:05:25 PM TECHNIQUE: CT of the head was performed without the administration of intravenous contrast. Automated exposure control, iterative reconstruction, and/or weight based adjustment of the mA/kV was utilized to reduce the radiation dose to as low as reasonably achievable. COMPARISON: None available. CLINICAL HISTORY: Mental status change, unknown cause. FINDINGS: BRAIN AND VENTRICLES: There is no evidence of an acute cortically based infarct, intracranial hemorrhage, mass, midline shift, hydrocephalus, or extra-axial fluid collection. Lacunar infarcts are present in the basal ganglia and thalami bilaterally, some of which are chronic in appearance while others are of indeterminate age. Patchy hypodensities in the cerebral  white matter bilaterally are nonspecific but compatible with moderate chronic small vessel ischemic disease. Cerebral atrophy is mild for age. Calcified atherosclerosis at the skull base. ORBITS: No acute abnormality. SINUSES: No acute abnormality. SOFT TISSUES AND SKULL: No acute soft tissue abnormality. No skull fracture. IMPRESSION: 1. No evidence of an acute cortical infarct or intracranial hemorrhage. 2. Moderate chronic small vessel ischemic disease with age-indeterminate lacunar infarcts in the deep ganglia. Electronically signed by: Dasie Hamburg MD 06/05/2024 03:45 PM EST RP Workstation: [id number]   DG Chest Portable 1 View Result Date: 06/05/2024 CLINICAL DATA:  Shortness of breath. Altered mental status. Weakness. EXAM: PORTABLE CHEST - 1 VIEW COMPARISON:  08/16/2019 FINDINGS: Heart is mildly  enlarged. Mild pulmonary vascular congestion. Mediastinal silhouette otherwise within normal limits. Bibasilar opacities likely a combination of pulmonary edema, atelectasis, and small pleural effusions. Advanced degenerative changes of the LEFT glenohumeral joint. IMPRESSION: Radiographic findings most consistent with CHF. Electronically Signed   By: Aliene Lloyd M.D.   On: 06/05/2024 14:49     Scheduled Meds:  apixaban   5 mg Oral BID   atorvastatin   40 mg Oral Daily   Chlorhexidine  Gluconate Cloth  6 each Topical Daily   furosemide   40 mg Oral Daily   metoprolol  succinate  50 mg Oral Daily   sodium chloride  flush  3 mL Intravenous Q12H   spironolactone   25 mg Oral Daily   tamsulosin   0.4 mg Oral QPC supper   Continuous Infusions:   LOS: 5 days    Time spent:    Sigurd Pac, MD [facility]   06/10/2024, 11:45 AM    "

## 2024-06-11 DIAGNOSIS — G9340 Encephalopathy, unspecified: Secondary | ICD-10-CM | POA: Diagnosis not present

## 2024-06-11 LAB — CBC
HCT: 43.6 % (ref 39.0–52.0)
Hemoglobin: 14.5 g/dL (ref 13.0–17.0)
MCH: 33.4 pg (ref 26.0–34.0)
MCHC: 33.3 g/dL (ref 30.0–36.0)
MCV: 100.5 fL — ABNORMAL HIGH (ref 80.0–100.0)
Platelets: 289 10*3/uL (ref 150–400)
RBC: 4.34 MIL/uL (ref 4.22–5.81)
RDW: 16.5 % — ABNORMAL HIGH (ref 11.5–15.5)
WBC: 16.7 10*3/uL — ABNORMAL HIGH (ref 4.0–10.5)
nRBC: 0 % (ref 0.0–0.2)

## 2024-06-11 LAB — BASIC METABOLIC PANEL WITH GFR
Anion gap: 12 (ref 5–15)
BUN: 38 mg/dL — ABNORMAL HIGH (ref 8–23)
CO2: 29 mmol/L (ref 22–32)
Calcium: 8.7 mg/dL — ABNORMAL LOW (ref 8.9–10.3)
Chloride: 101 mmol/L (ref 98–111)
Creatinine, Ser: 1.42 mg/dL — ABNORMAL HIGH (ref 0.61–1.24)
GFR, Estimated: 46 mL/min — ABNORMAL LOW
Glucose, Bld: 82 mg/dL (ref 70–99)
Potassium: 3.6 mmol/L (ref 3.5–5.1)
Sodium: 142 mmol/L (ref 135–145)

## 2024-06-11 MED ORDER — SPIRONOLACTONE 25 MG PO TABS: 25.0000 mg | ORAL_TABLET | Freq: Every day | ORAL | 0 refills | Status: AC

## 2024-06-11 MED ORDER — MELATONIN 3 MG PO TABS: 3.0000 mg | ORAL_TABLET | Freq: Every evening | ORAL | Status: AC | PRN

## 2024-06-11 MED ORDER — METOPROLOL SUCCINATE ER 50 MG PO TB24: 50.0000 mg | ORAL_TABLET | Freq: Every day | ORAL | Status: AC

## 2024-06-11 MED ORDER — TAMSULOSIN HCL 0.4 MG PO CAPS: 0.4000 mg | ORAL_CAPSULE | Freq: Every day | ORAL | 0 refills | Status: AC

## 2024-06-11 NOTE — Evaluation (Signed)
 Occupational Therapy Evaluation Patient Details Name: Michael Haas MRN: [id number] DOB: 22-Jun-1928 Today's Date: 06/11/2024   History of Present Illness   Pt is a 89 y.o. M presenting to [facility] on 06/05/24 with SOB and AMS. Pt admitted with acute encephalopathy and hypothermia. PMH is significant for CHF, HLD, HTN.     Clinical Impressions Prior to this admission, patient living with his wife, sponge bathes at baseline, and using a rollator for mobility. Currently, patient with minimal decreased activity tolerance, and need for min A for transfers, functional ambulation, and ADL management. Patient VSS on RA, and able to compelte bouts of mobility in the room after ambulating to the bathroom at min A level. Patient returning to supine at the end of the session. OT will continue to follow acutely.      If plan is discharge home, recommend the following:   A little help with walking and/or transfers;A little help with bathing/dressing/bathroom;Assist for transportation;Help with stairs or ramp for entrance     Functional Status Assessment   Patient has had a recent decline in their functional status and demonstrates the ability to make significant improvements in function in a reasonable and predictable amount of time.     Equipment Recommendations   None recommended by OT     Recommendations for Other Services         Precautions/Restrictions   Precautions Precautions: Fall Recall of Precautions/Restrictions: Intact Restrictions Weight Bearing Restrictions Per Provider Order: No     Mobility Bed Mobility Overal bed mobility: Needs Assistance Bed Mobility: Sit to Supine       Sit to supine: Min assist   General bed mobility comments: Min A to return BLEs back to bed due to fatigue    Transfers Overall transfer level: Needs assistance Equipment used: Rolling walker (2 wheels) Transfers: Sit to/from Stand, Bed to chair/wheelchair/BSC Sit to Stand: Min  assist           General transfer comment: Min A from recliner x2, then from elevated toilet seat able to complete at [facility]      Balance Overall balance assessment: Needs assistance Sitting-balance support: No upper extremity supported, Feet supported Sitting balance-Leahy Scale: Fair     Standing balance support: Bilateral upper extremity supported, During functional activity, Reliant on assistive device for balance Standing balance-Leahy Scale: Poor Standing balance comment: reliant on external support especially with increased mobility                           ADL either performed or assessed with clinical judgement   ADL Overall ADL's : Needs assistance/impaired Eating/Feeding: Set up;Sitting   Grooming: Set up;Sitting;Wash/dry face;Wash/dry hands   Upper Body Bathing: Contact guard assist;Sitting   Lower Body Bathing: Minimal assistance;Sitting/lateral leans;Sit to/from stand   Upper Body Dressing : Contact guard assist;Sitting   Lower Body Dressing: Minimal assistance;Sitting/lateral leans;Sit to/from stand   Toilet Transfer: Minimal assistance;Ambulation;Regular Teacher, Adult Education Details (indicate cue type and reason): BSC over top Toileting- Clothing Manipulation and Hygiene: Sit to/from stand;Sitting/lateral lean;Minimal assistance Toileting - Clothing Manipulation Details (indicate cue type and reason): for thoroughness     Functional mobility during ADLs: Minimal assistance;Rolling walker (2 wheels);Cueing for safety;Cueing for sequencing General ADL Comments: Prior to this admission, patient living with his wife, sponge bathes at baseline, and using a rollator for mobility. Currently, patient with minimal decreased activity tolerance, and need for min A for transfers, functional ambulation, and ADL management. Patient  VSS on RA, and able to compelte bouts of mobility in the room after ambulating to the bathroom at min A level. Patient returning to  supine at the end of the session. OT will continue to follow acutely.     Vision Baseline Vision/History: 0 No visual deficits Ability to See in Adequate Light: 0 Adequate Patient Visual Report: No change from baseline Vision Assessment?: No apparent visual deficits     Perception Perception: Not tested       Praxis Praxis: Not tested       Pertinent Vitals/Pain Pain Assessment Pain Assessment: No/denies pain     Extremity/Trunk Assessment Upper Extremity Assessment Upper Extremity Assessment: Right hand dominant;Generalized weakness   Lower Extremity Assessment Lower Extremity Assessment: Defer to PT evaluation   Cervical / Trunk Assessment Cervical / Trunk Assessment: Kyphotic   Communication Communication Communication: Impaired Factors Affecting Communication: Hearing impaired   Cognition Arousal: Alert Behavior During Therapy: WFL for tasks assessed/performed Cognition: No apparent impairments                               Following commands: Intact       Cueing  General Comments   Cueing Techniques: Verbal cues;Gestural cues  VSS on RA   Exercises     Shoulder Instructions      Home Living Family/patient expects to be discharged to:: Private residence Living Arrangements: Spouse/significant other Available Help at Discharge: Family;Available PRN/intermittently Type of Home: House Home Access: Stairs to enter [employer] of Steps: 1 Entrance Stairs-Rails: Right;Can reach both;Left Home Layout: One level     Bathroom Shower/Tub: Sponge bathes at baseline (sits on commode while sponge baths)   Bathroom Toilet: Standard Bathroom Accessibility: Yes   Home Equipment: Agricultural Consultant (2 wheels);Rollator (4 wheels);Toilet riser          Prior Functioning/Environment Prior Level of Function : Independent/Modified Independent             Mobility Comments: mod I using rollator for all mobility ADLs Comments: mod  I. performs sponge baths at baseline    OT Problem List: Decreased strength;Decreased activity tolerance;Impaired balance (sitting and/or standing)   OT Treatment/Interventions: Self-care/ADL training;Therapeutic exercise;Energy conservation;DME and/or AE instruction;Manual therapy;Therapeutic activities;Patient/family education;Balance training      OT Goals(Current goals can be found in the care plan section)   Acute Rehab OT Goals Patient Stated Goal: to get better OT Goal Formulation: With patient Time For Goal Achievement: 06/25/24 Potential to Achieve Goals: Good ADL Goals Pt Will Perform Lower Body Bathing: with modified independence;sitting/lateral leans;sit to/from stand Pt Will Perform Lower Body Dressing: with modified independence;sitting/lateral leans;sit to/from stand Pt Will Transfer to Toilet: with modified independence;ambulating;grab bars Pt Will Perform Toileting - Clothing Manipulation and hygiene: with modified independence;sitting/lateral leans;sit to/from stand Additional ADL Goal #1: Patient will be able to complete functional task in standing for 3-5 minutes prior to needing rest break in order to increase overall activity tolerance.   OT Frequency:  Min 2X/week    Co-evaluation              AM-PAC OT 6 Clicks Daily Activity     Outcome Measure Help from another person eating meals?: A Little Help from another person taking care of personal grooming?: A Little Help from another person toileting, which includes using toliet, bedpan, or urinal?: A Little Help from another person bathing (including washing, rinsing, drying)?: A Little Help from another person to  put on and taking off regular upper body clothing?: A Little Help from another person to put on and taking off regular lower body clothing?: A Little 6 Click Score: 18   End of Session Equipment Utilized During Treatment: Gait belt;Rolling walker (2 wheels) Nurse Communication: Mobility  status  Activity Tolerance: Patient tolerated treatment well Patient left: in bed;with call bell/phone within reach;with bed alarm set  OT Visit Diagnosis: Other abnormalities of gait and mobility (R26.89);Muscle weakness (generalized) (M62.81)                Time: 8672-8592 OT Time Calculation (min): 40 min Charges:  OT General Charges $OT Visit: 1 Visit OT Evaluation $OT Eval Moderate Complexity: 1 Mod OT Treatments $Self Care/Home Management : 23-37 mins  Ronal Gift E. Noralee Dutko, OTR/L Acute Rehabilitation Services [phone]   Ronal Gift Salt 06/11/2024, 3:09 PM

## 2024-06-11 NOTE — Progress Notes (Signed)
 Physical Therapy Treatment Patient Details Name: Michael Haas MRN: [id number] DOB: [birth year] Today's Date: 06/11/2024   History of Present Illness Pt is a 89 y.o.male admitted 06/05/24 with SOB and AMS. Pt admitted with acute encephalopathy, hypothermia, acute on chronic HF. Imaging showed bilateral pleural effusions, 2 cm pancreatic head/neck lesion. PMH is significant for CHF, HLD, HTN.   PT Comments  Pt progressing with mobility. Pt performing short bouts of standing activity/ADLs and ambulation with rollator and CGA-minA for stability; notable LLE drag with increased distance, which pt reports is baseline. Pt remains limited by generalized weakness, decreased activity tolerance, and impaired balance strategies/postural reactions; pt motivated to participate. Continue to recommend post-acute rehab (< 3 hrs/day) to maximize functional mobility and independence.     If plan is discharge home, recommend the following: A little help with walking and/or transfers;A little help with bathing/dressing/bathroom;Assistance with cooking/housework;Assist for transportation;Help with stairs or ramp for entrance   Can travel by private vehicle     Yes  Equipment Recommendations  None recommended by PT    Recommendations for Other Services       Precautions / Restrictions Precautions Precautions: Fall Recall of Precautions/Restrictions: Intact Restrictions Weight Bearing Restrictions Per Provider Order: No     Mobility  Bed Mobility Overal bed mobility: Needs Assistance Bed Mobility: Supine to Sit     Supine to sit: Supervision, HOB elevated, Used rails     General bed mobility comments: increased time and effort, reliant on bed rail, supervision for safety/lines    Transfers Overall transfer level: Needs assistance Equipment used: Rollator (4 wheels) Transfers: Sit to/from Stand Sit to Stand: Min assist, Contact guard assist           General transfer comment: multiple  sit<>stand from EOB, CGA-minA for trunk elevation and stability; sitting prematurely on BSC (over toilet) requiring cues for safety/sequencing    Ambulation/Gait Ambulation/Gait assistance: Contact guard assist, Min assist Gait Distance (Feet): 15 Feet Assistive device: Rollator (4 wheels) Gait Pattern/deviations: Step-through pattern, Decreased stride length, Trunk flexed, Decreased dorsiflexion - left, Knee flexed in stance - right, Knee flexed in stance - left Gait velocity: Decreased     General Gait Details: slow, mildly unsteady gait with rollator and CGA-minA for stability especially navigating into small bathroom and turning due to LLE dragging; noted increased L foot/LLE drag with distance, suspect related to fatigue (pt reports baseline)   Stairs             Wheelchair Mobility     Tilt Bed    Modified Rankin (Stroke Patients Only)       Balance Overall balance assessment: Needs assistance Sitting-balance support: No upper extremity supported, Feet supported Sitting balance-Leahy Scale: Fair     Standing balance support: No upper extremity supported, Single extremity supported, During functional activity Standing balance-Leahy Scale: Poor Standing balance comment: leaning trunk against sink to balance when washing hands, close CGA for balance                            Communication Communication Communication: Impaired Factors Affecting Communication: Hearing impaired  Cognition Arousal: Alert Behavior During Therapy: WFL for tasks assessed/performed   PT - Cognitive impairments: No family/caregiver present to determine baseline                       PT - Cognition Comments: some memory deficits and slowed initiation noted requiring intermittent cues to complete  task Following commands: Intact (increased time)      Cueing Cueing Techniques: Verbal cues, Gestural cues  Exercises      General Comments General comments (skin  integrity, edema, etc.): educ re: role of acute PT, POC, activity recommendations, pulmonary hygiene, importance of mobility, potential d/c needs      Pertinent Vitals/Pain      Home Living                          Prior Function            PT Goals (current goals can now be found in the care plan section) Progress towards PT goals: Progressing toward goals    Frequency    Min 2X/week      PT Plan      Co-evaluation              AM-PAC PT 6 Clicks Mobility   Outcome Measure  Help needed turning from your back to your side while in a flat bed without using bedrails?: A Little Help needed moving from lying on your back to sitting on the side of a flat bed without using bedrails?: A Little Help needed moving to and from a bed to a chair (including a wheelchair)?: A Little Help needed standing up from a chair using your arms (e.g., wheelchair or bedside chair)?: A Little Help needed to walk in hospital room?: A Little Help needed climbing 3-5 steps with a railing? : Total 6 Click Score: 16    End of Session Equipment Utilized During Treatment: Gait belt Activity Tolerance: Patient tolerated treatment well Patient left: Other (comment);with call bell/phone within reach (using bathroom, reports needing increased time for BM) Nurse Communication: Mobility status PT Visit Diagnosis: Unsteadiness on feet (R26.81);Other abnormalities of gait and mobility (R26.89);Muscle weakness (generalized) (M62.81)     Time: 9098-9075 PT Time Calculation (min) (ACUTE ONLY): 23 min  Charges:    $Therapeutic Activity: 23-37 mins PT General Charges $$ ACUTE PT VISIT: 1 Visit                     Darice Almas, PT, DPT Acute Rehabilitation Services  Personal: Secure Chat Rehab Office: [phone]  Darice LITTIE Almas 06/11/2024, 10:27 AM

## 2024-06-11 NOTE — Progress Notes (Signed)
 Orthopedic Tech Progress Note Patient Details:  EDREES VALENT 12/11/1928 [id number]  Ortho Devices Type of Ortho Device: Ace wrap, Unna boot Ortho Device/Splint Location: BLE Ortho Device/Splint Interventions: Removal, Ordered, Application, Adjustment   Post Interventions Patient Tolerated: Well Instructions Provided: Care of device  Delanna LITTIE Pac 06/11/2024, 11:02 AM

## 2024-06-11 NOTE — TOC Transition Note (Signed)
 Transition of Care [facility]) - Discharge Note   Patient Details  Name: Michael Haas MRN: [id number] Date of Birth: 1929-01-03  [facility]) CM/SW Contact:  Michael Haas, LCSWA Phone Number: 06/11/2024, 4:02 PM   Clinical Narrative:   [facility] Anticipated DC date: 06/11/24  Family notified: Parthasarathy,Michael Haas Spouse, Emergency Contact [phone]  Transport by: [facility]   Per MD patient ready for DC to [facility]. RN to call report prior to discharge [phone]). RN, patient, patient's family, and facility notified of DC. Discharge Summary and FL2 sent to facility. DC packet on chart. Ambulance transport requested for patient 4:03 PM.   CSW will sign off for now as social work intervention is no longer needed. Please consult us  again if new needs arise.      Final next level of care: Skilled Nursing Facility Barriers to Discharge: Barriers Resolved   Patient Goals and CMS Choice Patient states their goals for this hospitalization and ongoing recovery are:: To go to [facility] CMS Medicare.gov Compare Post Acute Care list provided to:: Other (Comment Required) (Spouse) Choice offered to / list presented to : Spouse [facility] ownership interest in [facility].provided to:: Spouse    Discharge Placement              Patient [facility] bed at: [facility] Patient to be transferred to facility by: [facility] Name of family member notified: [place],Michael Haas Spouse, Emergency Contact [phone] Patient and family notified of of transfer: 06/11/24  Discharge Plan and Services Additional resources added to the After Visit Summary for   In-house Referral: Clinical Social Work   Post Acute Care Choice: Skilled Nursing Facility                               Social Drivers of Health (SDOH) Interventions SDOH Screenings   Food Insecurity: No Food Insecurity (06/05/2024)  Housing: Low Risk (06/05/2024)  Transportation  Needs: No Transportation Needs (06/05/2024)  Utilities: Not At Risk (06/05/2024)  Social Connections: Socially Isolated (06/05/2024)  Tobacco Use: Medium Risk (06/05/2024)     Readmission Risk Interventions    06/06/2024   12:39 PM  Readmission Risk Prevention Plan  Medication Screening Complete  Transportation Screening Complete

## 2024-06-14 LAB — CULTURE, BLOOD (ROUTINE X 2)
Culture: NO GROWTH
Culture: NO GROWTH

## 2024-06-29 ENCOUNTER — Ambulatory Visit (HOSPITAL_BASED_OUTPATIENT_CLINIC_OR_DEPARTMENT_OTHER): Admitting: Family
